# Patient Record
Sex: Female | Born: 1995 | Race: Black or African American | Hispanic: No | Marital: Single | Smoking: Never smoker
Health system: Southern US, Community
[De-identification: ages and names within clinical notes are randomized; demographics above are authoritative.]

## PROBLEM LIST (undated history)

## (undated) ENCOUNTER — Inpatient Hospital Stay (HOSPITAL_COMMUNITY): Payer: Self-pay

## (undated) DIAGNOSIS — F32A Depression, unspecified: Secondary | ICD-10-CM

## (undated) DIAGNOSIS — T7840XA Allergy, unspecified, initial encounter: Secondary | ICD-10-CM

## (undated) DIAGNOSIS — A549 Gonococcal infection, unspecified: Secondary | ICD-10-CM

## (undated) DIAGNOSIS — D649 Anemia, unspecified: Secondary | ICD-10-CM

## (undated) DIAGNOSIS — F329 Major depressive disorder, single episode, unspecified: Secondary | ICD-10-CM

## (undated) DIAGNOSIS — N946 Dysmenorrhea, unspecified: Secondary | ICD-10-CM

## (undated) DIAGNOSIS — F419 Anxiety disorder, unspecified: Secondary | ICD-10-CM

## (undated) DIAGNOSIS — A749 Chlamydial infection, unspecified: Secondary | ICD-10-CM

## (undated) HISTORY — DX: Allergy, unspecified, initial encounter: T78.40XA

## (undated) HISTORY — DX: Anxiety disorder, unspecified: F41.9

## (undated) HISTORY — PX: TONSILLECTOMY AND ADENOIDECTOMY: SHX28

## (undated) HISTORY — PX: WISDOM TOOTH EXTRACTION: SHX21

## (undated) HISTORY — PX: TYMPANOSTOMY TUBE PLACEMENT: SHX32

## (undated) HISTORY — PX: LAPAROSCOPY ABDOMEN DIAGNOSTIC: PRO50

---
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
## 2017-02-22 ENCOUNTER — Encounter (HOSPITAL_COMMUNITY): Payer: Self-pay | Admitting: *Deleted

## 2017-02-22 ENCOUNTER — Emergency Department (HOSPITAL_COMMUNITY)
Admission: EM | Admit: 2017-02-22 | Discharge: 2017-02-22 | Disposition: A | Discharge status: Home / Self Care | Payer: Medicaid Other | Attending: Emergency Medicine | Admitting: Emergency Medicine

## 2017-02-22 DIAGNOSIS — N946 Dysmenorrhea, unspecified: Secondary | ICD-10-CM | POA: Diagnosis present

## 2017-02-22 DIAGNOSIS — Z79899 Other long term (current) drug therapy: Secondary | ICD-10-CM | POA: Insufficient documentation

## 2017-02-22 LAB — COMPREHENSIVE METABOLIC PANEL
ALBUMIN: 3.6 g/dL (ref 3.5–5.0)
ALT: 16 U/L (ref 14–54)
ANION GAP: 6 (ref 5–15)
AST: 22 U/L (ref 15–41)
Alkaline Phosphatase: 45 U/L (ref 38–126)
BUN: 8 mg/dL (ref 6–20)
CO2: 25 mmol/L (ref 22–32)
Calcium: 8.9 mg/dL (ref 8.9–10.3)
Chloride: 105 mmol/L (ref 101–111)
Creatinine, Ser: 0.89 mg/dL (ref 0.44–1.00)
GFR calc Af Amer: >60 mL/min (ref >60)
GFR calc non Af Amer: >60 mL/min (ref >60)
GLUCOSE: 89 mg/dL (ref 65–99)
POTASSIUM: 3.8 mmol/L (ref 3.5–5.1)
SODIUM: 136 mmol/L (ref 135–145)
Total Bilirubin: 0.6 mg/dL (ref 0.3–1.2)
Total Protein: 6.9 g/dL (ref 6.5–8.1)

## 2017-02-22 LAB — URINALYSIS, MICROSCOPIC (REFLEX)

## 2017-02-22 LAB — URINALYSIS, ROUTINE W REFLEX MICROSCOPIC
BILIRUBIN URINE: NEGATIVE
GLUCOSE, UA: NEGATIVE mg/dL
Ketones, ur: NEGATIVE mg/dL
Leukocytes, UA: NEGATIVE
NITRITE: NEGATIVE
PH: 6.5 (ref 5.0–8.0)
Protein, ur: NEGATIVE mg/dL
SPECIFIC GRAVITY, URINE: 1.02 (ref 1.005–1.030)

## 2017-02-22 LAB — CBC
HEMATOCRIT: 38.9 % (ref 36.0–46.0)
HEMOGLOBIN: 12.6 g/dL (ref 12.0–15.0)
MCH: 28.3 pg (ref 26.0–34.0)
MCHC: 32.4 g/dL (ref 30.0–36.0)
MCV: 87.4 fL (ref 78.0–100.0)
Platelets: 469 10*3/uL — ABNORMAL HIGH (ref 150–400)
RBC: 4.45 MIL/uL (ref 3.87–5.11)
RDW: 12.9 % (ref 11.5–15.5)
WBC: 6.6 10*3/uL (ref 4.0–10.5)

## 2017-02-22 LAB — LIPASE, BLOOD: Lipase: 25 U/L (ref 11–51)

## 2017-02-22 MED ORDER — NAPROXEN 250 MG PO TABS
500.0000 mg | ORAL_TABLET | Freq: Once | ORAL | Status: AC
  Administered 2017-02-22: 500 mg via ORAL
  Filled 2017-02-22: qty 2

## 2017-02-22 MED ORDER — NAPROXEN 500 MG PO TABS: 500.0000 mg | ORAL_TABLET | Freq: Two times a day (BID) | ORAL | 0 refills | Status: DC

## 2017-02-22 NOTE — Discharge Instructions (Signed)
Follow these instructions at home: Only take over-the-counter or prescription medicines as directed by your health care provider. Place a heating pad or hot water bottle on your lower back or abdomen. Do not sleep with the heating pad. Use aerobic exercises, walking, swimming, biking, and other exercises to help lessen the cramping. Massage to the lower back or abdomen may help. Stop smoking. Avoid alcohol and caffeine. Contact a health care provider if: Your pain does not get better with medicine. You have pain with sexual intercourse. Your pain increases and is not controlled with medicines. You have abnormal vaginal bleeding with your period. You develop nausea or vomiting with your period that is not controlled with medicine. Get help right away if: You pass out.

## 2017-02-22 NOTE — ED Provider Notes (Signed)
[facility]   CSN: [id number] Arrival date & time: 02/22/17  0505     History   Chief Complaint Chief Complaint  Patient presents with  . Abdominal Pain    HPI Lori Weaver is a 21 y.o. female who presents emergency Department with chief complaint of dysmenorrhea. Patient states that she has been dealing with severe cramps since she began her period. She states that her mother before she passed away, had the same problem. She is currently taking oral contraceptives but states that they don't seem to be helping much anymore. She states that for the past 2 days she has had severe abdominal pain, back pain and cramping. She states that over-the-counter medications are no longer working for her. She took 400 mg of ibuprofen every 4 hours and Tylenol without any relief for her symptoms. She also tried a heating pad. She states that the pain seems to be getting much more intense and her bleeding is heavier. [place] and is now living in [place]. She does not have established a PCP or OB/GYN. She denies urinary symptoms, vaginal discharge or other symptoms of infection. The patient began her period this morning. She denies risk factors for STI. HPI  Past Medical History:  Diagnosis Date  . UTI (urinary tract infection)     There are no active problems to display for this patient.   Past Surgical History:  Procedure Laterality Date  . TONSILLECTOMY      OB History    No data available       Home Medications    Prior to Admission medications   Medication Sig Start Date End Date Taking? Authorizing Provider  FLUoxetine HCl (PROZAC PO) Take by mouth.    [provider]  nitrofurantoin, macrocrystal-monohydrate, (MACROBID) 100 MG capsule Take 1 capsule (100 mg total) by mouth 2 (two) times daily. 11/09/16   Elvina SidleLauenstein, Kurt, MD  Norethin Ace-Eth Estrad-FE (JUNEL FE 24 PO) Take by mouth.    [provider]    Family  History No family history on file.  Social History Social History  Substance Use Topics  . Smoking status: Never Smoker  . Smokeless tobacco: Never Used  . Alcohol use No     Allergies   Patient has no known allergies.   Review of Systems Review of Systems Ten systems reviewed and are negative for acute change, except as noted in the HPI.    Physical Exam Updated Vital Signs BP (!) 125/94   Pulse 94   Temp 98.4 F (36.9 C) (Oral)   Resp 16   Ht 4\' 10"  (1.473 m)   Wt 54.4 kg (120 lb)   LMP 02/22/2017   SpO2 100%   BMI 25.08 kg/m   Physical Exam  Constitutional: She is oriented to person, place, and time. She appears well-developed and well-nourished. No distress.  HENT:  Head: Normocephalic and atraumatic.  Eyes: Conjunctivae are normal. No scleral icterus.  Neck: Normal range of motion.  Cardiovascular: Normal rate, regular rhythm and normal heart sounds.  Exam reveals no gallop and no friction rub.   No murmur heard. Pulmonary/Chest: Effort normal and breath sounds normal. No respiratory distress.  Abdominal: Soft. Bowel sounds are normal. She exhibits no distension and no mass. There is no tenderness. There is no guarding.  Neurological: She is alert and oriented to person, place, and time.  Skin: Skin is warm and dry. She is not diaphoretic.  Psychiatric: Her behavior is  normal.  Nursing note and vitals reviewed.    ED Treatments / Results  Labs (all labs ordered are listed, but only abnormal results are displayed) Labs Reviewed  CBC - Abnormal; Notable for the following:       Result Value   Platelets 469 (*)    All other components within normal limits  URINALYSIS, ROUTINE W REFLEX MICROSCOPIC - Abnormal; Notable for the following:    APPearance CLOUDY (*)    Hgb urine dipstick LARGE (*)    All other components within normal limits  URINALYSIS, MICROSCOPIC (REFLEX) - Abnormal; Notable for the following:    Bacteria, UA RARE (*)    Squamous  Epithelial / LPF 6-30 (*)    All other components within normal limits  LIPASE, BLOOD  COMPREHENSIVE METABOLIC PANEL    EKG  EKG Interpretation None       Radiology No results found.  Procedures Procedures (including critical care time)  Medications Ordered in ED Medications - No data to display   Initial Impression / Assessment and Plan / ED Course  I have reviewed the triage vital signs and the nursing notes.  Pertinent labs & imaging results that were available during my care of the patient were reviewed by me and considered in my medical decision making (see chart for details).      patient with worsening and chronic dysmenorrhea. Her lab results are reviewed and appear within normal limits. Her urine does have blood, which is secondary to the onset of her period this morning. We will try high-dose naproxen. She may supplement with Tylenol. Discussed supportive care. Patient is to follow closely with OB/GYN. I discussed return precautions.The patient appears reasonably screened and/or stabilized for discharge and I doubt any other medical condition or other [facility] requiring further screening, evaluation, or treatment in the ED at this time prior to discharge.   Final Clinical Impressions(s) / ED Diagnoses   Final diagnoses:  Severe dysmenorrhea    New Prescriptions New Prescriptions   No medications on file     Arthor CaptainHarris, [place], PA-C 02/22/17 [id number]    Gilda CreasePollina, Christopher J, MD 02/22/17 [phone]

## 2017-02-22 NOTE — ED Triage Notes (Signed)
The pt is c/o abd pain for 2 days her period started  now

## 2017-02-22 NOTE — ED Notes (Signed)
Friends brought food and drink, informed pt that she should wait until she sees the provider

## 2017-03-02 DIAGNOSIS — F321 Major depressive disorder, single episode, moderate: Secondary | ICD-10-CM | POA: Diagnosis not present

## 2017-03-02 DIAGNOSIS — F411 Generalized anxiety disorder: Secondary | ICD-10-CM | POA: Diagnosis not present

## 2017-04-06 ENCOUNTER — Encounter: Payer: Self-pay | Admitting: Obstetrics & Gynecology

## 2017-04-06 ENCOUNTER — Ambulatory Visit (INDEPENDENT_AMBULATORY_CARE_PROVIDER_SITE_OTHER): Payer: Medicaid Other | Admitting: Obstetrics & Gynecology

## 2017-04-06 VITALS — BP 128/71 | HR 80 | Temp 98.6°F | Ht 59.0 in | Wt 124.0 lb

## 2017-04-06 DIAGNOSIS — N946 Dysmenorrhea, unspecified: Secondary | ICD-10-CM | POA: Diagnosis not present

## 2017-04-06 DIAGNOSIS — K649 Unspecified hemorrhoids: Secondary | ICD-10-CM | POA: Diagnosis not present

## 2017-04-06 MED ORDER — HYDROCORTISONE ACETATE 25 MG RE SUPP: 25.0000 mg | Freq: Two times a day (BID) | RECTAL | 2 refills | Status: DC

## 2017-04-06 MED ORDER — NORETHIN ACE-ETH ESTRAD-FE 1-20 MG-MCG PO TABS: 1.0000 | ORAL_TABLET | Freq: Every day | ORAL | 8 refills | Status: DC

## 2017-04-06 MED ORDER — NAPROXEN 500 MG PO TABS: 500.0000 mg | ORAL_TABLET | Freq: Two times a day (BID) | ORAL | 8 refills | Status: DC | PRN

## 2017-04-06 NOTE — Patient Instructions (Signed)
Dysmenorrhea Menstrual cramps (dysmenorrhea) are caused by the muscles of the uterus tightening (contracting) during a menstrual period. For some women, this discomfort is merely bothersome. For others, dysmenorrhea can be severe enough to interfere with everyday activities for a few days each month. Primary dysmenorrhea is menstrual cramps that last a couple of days when you start having menstrual periods or soon after. This often begins after a teenager starts having her period. As a woman gets older or has a baby, the cramps will usually lessen or disappear. Secondary dysmenorrhea begins later in life, lasts longer, and the pain may be stronger than primary dysmenorrhea. The pain may start before the period and last a few days after the period. What are the causes? Dysmenorrhea is usually caused by an underlying problem, such as:  The tissue lining the uterus grows outside of the uterus in other areas of the body (endometriosis).  The endometrial tissue, which normally lines the uterus, is found in or grows into the muscular walls of the uterus (adenomyosis).  The pelvic blood vessels are engorged with blood just before the menstrual period (pelvic congestive syndrome).  Overgrowth of cells (polyps) in the lining of the uterus or cervix.  Falling down of the uterus (prolapse) because of loose or stretched ligaments.  Depression.  Bladder problems, infection, or inflammation.  Problems with the intestine, a tumor, or irritable bowel syndrome.  Cancer of the female organs or bladder.  A severely tipped uterus.  A very tight opening or closed cervix.  Noncancerous tumors of the uterus (fibroids).  Pelvic inflammatory disease (PID).  Pelvic scarring (adhesions) from a previous surgery.  Ovarian cyst.  An intrauterine device (IUD) used for birth control.  What increases the risk? You may be at greater risk of dysmenorrhea if:  You are younger than age 44.  You started puberty  early.  You have irregular or heavy bleeding.  You have never given birth.  You have a family history of this problem.  You are a smoker.  What are the signs or symptoms?  Cramping or throbbing pain in your lower abdomen.  Headaches.  Lower back pain.  Nausea or vomiting.  Diarrhea.  Sweating or dizziness.  Loose stools. How is this diagnosed? A diagnosis is based on your history, symptoms, physical exam, diagnostic tests, or procedures. Diagnostic tests or procedures may include:  Blood tests.  Ultrasonography.  An examination of the lining of the uterus (dilation and curettage, D&C).  An examination inside your abdomen or pelvis with a scope (laparoscopy).  X-rays.  CT scan.  MRI.  An examination inside the bladder with a scope (cystoscopy).  An examination inside the intestine or stomach with a scope (colonoscopy, gastroscopy).  How is this treated? Treatment depends on the cause of the dysmenorrhea. Treatment may include:  Pain medicine prescribed by your health care provider.  Birth control pills or an IUD with progesterone hormone in it.  Hormone replacement therapy.  Nonsteroidal anti-inflammatory drugs (NSAIDs). These may help stop the production of prostaglandins.  Surgery to remove adhesions, endometriosis, ovarian cyst, or fibroids.  Removal of the uterus (hysterectomy).  Progesterone shots to stop the menstrual period.  Cutting the nerves on the sacrum that go to the female organs (presacral neurectomy).  Electric current to the sacral nerves (sacral nerve stimulation).  Antidepressant medicine.  Psychiatric therapy, counseling, or group therapy.  Exercise and physical therapy.  Meditation and yoga therapy.  Acupuncture.  Follow these instructions at home:  Only take over-the-counter or  prescription medicines as directed by your health care provider.  Place a heating pad or hot water bottle on your lower back or abdomen. Do  not sleep with the heating pad.  Use aerobic exercises, walking, swimming, biking, and other exercises to help lessen the cramping.  Massage to the lower back or abdomen may help.  Stop smoking.  Avoid alcohol and caffeine. Contact a health care provider if:  Your pain does not get better with medicine.  You have pain with sexual intercourse.  Your pain increases and is not controlled with medicines.  You have abnormal vaginal bleeding with your period.  You develop nausea or vomiting with your period that is not controlled with medicine. Get help right away if: You pass out. This information is not intended to replace advice given to you by your health care provider. Make sure you discuss any questions you have with your health care provider. Document Released: 07/07/2005 Document Revised: 12/13/2015 Document Reviewed: 12/23/2012 Elsevier Interactive Patient Education  2017 [employer] Inc.    Diagnostic Laparoscopy A diagnostic laparoscopy is a procedure to diagnose diseases in the abdomen. During the procedure, a thin, lighted, pencil-sized instrument called a laparoscope is inserted into the abdomen through an incision. The laparoscope allows your health care provider to look at the organs inside your body. Tell a health care provider about:  Any allergies you have.  All medicines you are taking, including vitamins, herbs, eye drops, creams, and over-the-counter medicines.  Any problems you or family members have had with anesthetic medicines.  Any blood disorders you have.  Any surgeries you have had.  Any medical conditions you have. What are the risks? Generally, this is a safe procedure. However, problems can occur, which may include:  Infection.  Bleeding.  Damage to other organs.  Allergic reaction to the anesthetics used during the procedure.  What happens before the procedure?  Do not eat or drink anything after midnight on the night before the  procedure or as directed by your health care provider.  Ask your health care provider about: ? Changing or stopping your regular medicines. ? Taking medicines such as aspirin and ibuprofen. These medicines can thin your blood. Do not take these medicines before your procedure if your health care provider instructs you not to.  Plan to have someone take you home after the procedure. What happens during the procedure?  You may be given a medicine to help you relax (sedative).  You will be given a medicine to make you sleep (general anesthetic).  Your abdomen will be inflated with a gas. This will make your organs easier to see.  Small incisions will be made in your abdomen.  A laparoscope and other small instruments will be inserted into the abdomen through the incisions.  A tissue sample may be removed from an organ in the abdomen for examination.  The instruments will be removed from the abdomen.  The gas will be released.  The incisions will be closed with stitches (sutures). What happens after the procedure? Your blood pressure, heart rate, breathing rate, and blood oxygen level will be monitored often until the medicines you were given have worn off. This information is not intended to replace advice given to you by your health care provider. Make sure you discuss any questions you have with your health care provider. Document Released: 10/13/2000 Document Revised: 11/15/2015 Document Reviewed: 02/17/2014 Elsevier Interactive Patient Education  2018 [employer].   About Hemorrhoids  Hemorrhoids are swollen veins in  the lower rectum and anus.  Also called piles, hemorrhoids are a common problem.  Hemorrhoids may be internal (inside the rectum) or external (around the anus).  Internal Hemorrhoids  Internal hemorrhoids are often painless, but they rarely cause bleeding.  The internal veins may stretch and fall down (prolapse) through the anus to the outside of the body.  The  veins may then become irritated and painful.  External Hemorrhoids  External hemorrhoids can be easily seen or felt around the anal opening.  They are under the skin around the anus.  When the swollen veins are scratched or broken by straining, rubbing or wiping they sometimes bleed.  How Hemorrhoids Occur  Veins in the rectum and around the anus tend to swell under pressure.  Hemorrhoids can result from increased pressure in the veins of your anus or rectum.  Some sources of pressure are:   Straining to have a bowel movement because of constipation  Waiting too long to have a bowel movement  Coughing and sneezing often  Sitting for extended periods of time, including on the toilet  Diarrhea  Obesity  Trauma or injury to the anus  Some liver diseases  Stress  Family history of hemorrhoids  Pregnancy  Pregnant women should try to avoid becoming constipated, because they are more likely to have hemorrhoids during pregnancy.  In the last trimester of pregnancy, the enlarged uterus may press on blood vessels and causes hemorrhoids.  In addition, the strain of childbirth sometimes causes hemorrhoids after the birth.  Symptoms of Hemorrhoids  Some symptoms of hemorrhoids include:  Swelling and/or a tender lump around the anus  Itching, mild burning and bleeding around the anus  Painful bowel movements with or without constipation  Bright red blood covering the stool, on toilet paper or in the toilet bowel.   Symptoms usually go away within a few days.  Always talk to your doctor about any bleeding to make sure it is not from some other causes.  Diagnosing and Treating Hemorrhoids  Diagnosis is made by an examination by your healthcare provider.  Special test can be performed by your doctor.    Most cases of hemorrhoids can be treated with:  High-fiber diet: Eat more high-fiber foods, which help prevent constipation.  Ask for more detailed fiber information on types and  sources of fiber from your healthcare provider.  Fluids: Drink plenty of water.  This helps soften bowel movements so they are easier to pass.  Sitz baths and cold packs: Sitting in lukewarm water two or three times a day for 15 minutes cleases the anal area and may relieve discomfort.  If the water is too hot, swelling around the anus will get worse.  Placing a cloth-covered ice pack on the anus for ten minutes four times a day can also help reduce selling.  Gently pushing a prolapsed hemorrhoid back inside after the bath or ice pack can be helpful.  Medications: For mild discomfort, your healthcare provider may suggest over-the-counter pain medication or prescribe a cream or ointment for topical use.  The cream may contain witch hazel, zinc oxide or petroleum jelly.  Medicated suppositories are also a treatment option.  Always consult your doctor before applying medications or creams.  Procedures and surgeries: There are also a number of procedures and surgeries to shrink or remove hemorrhoids in more serious cases.  Talk to your physician about these options.  You can often prevent hemorrhoids or keep them from becoming worse by maintaining a  healthy lifestyle.  Eat a fiber-rich diet of fruits, vegetables and whole grains.  Also, drink plenty of water and exercise regularly.   2007, [employer] Doc.30

## 2017-04-06 NOTE — Progress Notes (Signed)
GYNECOLOGY OFFICE VISIT NOTE  History:  21 y.o. G0 here for discussion about evaluation of dysmenorrhea. She reports having painful menstrual periods since menarche, progressively getting worse.  Was started on Junel 1/20 and this helped a bit but now has gotten worse.  Has back pain thorough out cycle, but increased lower abdominal and pelvic cramping during periods. Periods last for 5 days during placebo week.  Takes Naproxen which sometimes helps. Patient is worried about and wants to be evaluated for endometriosis.  She denies any abnormal vaginal discharge or other GYN concerns but reports periodic blood in stool which she attributes to possible hemorrhoids. She is a Consulting civil engineer at [employer].   Past Medical History:  Diagnosis Date  . UTI (urinary tract infection)     Past Surgical History:  Procedure Laterality Date  . TONSILLECTOMY      The following portions of the patient's history were reviewed and updated as appropriate: allergies, current medications, past family history, past medical history, past social history, past surgical history and problem list.   Review of Systems:  Pertinent items noted in HPI and remainder of comprehensive ROS otherwise negative.   Objective:  Physical Exam BP 128/71   Pulse 80   Temp 98.6 F (37 C) (Oral)   Ht  (1.499 m)   Wt 124 lb (56.2 kg)   LMP 03/27/2017 (Exact Date)   BMI 25.04 kg/m  CONSTITUTIONAL: Well-developed, well-nourished female in no acute distress.  HENT:  Normocephalic, atraumatic. External right and left ear normal. Oropharynx is clear and moist EYES: Conjunctivae and EOM are normal. Pupils are equal, round, and reactive to light. No scleral icterus.  NECK: Normal range of motion, supple, no masses SKIN: Skin is warm and dry. No rash noted. Not diaphoretic. No erythema. No pallor. NEUROLOGIC: Alert and oriented to person, place, and time. Normal reflexes, muscle tone coordination. No cranial nerve deficit  noted. PSYCHIATRIC: Normal mood and affect. Normal behavior. Normal judgment and thought content. CARDIOVASCULAR: Normal heart rate noted RESPIRATORY: Effort and breath sounds normal, no problems with respiration noted ABDOMEN: Soft, no distention noted.   PELVIC: Deferred MUSCULOSKELETAL: Normal range of motion. No edema noted.  Labs and Imaging No results found.  Assessment & Plan:  1. Dysmenorrhea Discussed evaluation and management of dysmenorrhea in detail.  Discussed ultrasound evaluation and recommended diagnostic laparoscopy. Laparoscopy discussed in detail, risks/benefits/indications/alternatives also discussed.   She was told that she will be contacted by our surgical scheduler regarding the time and date of her surgery. Printed patient education handouts about the procedure were given to the patient to review at home. In the meantime, discussed management with continuous hormonal therapy with the Junel. If this is not as effective, will suggest increasing to a 30-35 mcg estradiol pill. Pelvic ultrasound also ordered to rule out any other structural anomaly. - norethindrone-ethinyl estradiol (JUNEL FE 1/20) 1-20 MG-MCG tablet; Take 1 tablet by mouth daily. Only take active/hormonal pills in a continuous fashion  Dispense: 3 Package; Refill: 8 - naproxen (NAPROSYN) 500 MG tablet; Take 1 tablet (500 mg total) by mouth 2 (two) times daily as needed. As needed for cramps. Take with meals.  Dispense: 30 tablet; Refill: 8 - US PELVIS (TRANSABDOMINAL ONLY); Future - US PELVIS TRANSVANGINAL NON-OB (TV ONLY); Future  2. Hemorrhoids, unspecified hemorrhoid type Will do trial of Anusol therapy. Told to follow up with PCP if condition persists or worsens.  - hydrocortisone (ANUSOL-HC) 25 MG suppository; Place 1 suppository (25 mg total) rectally  2 (two) times daily.  Dispense: 12 suppository; Refill: 2   Routine preventative health maintenance measures emphasized. Please refer to After Visit  Summary for other counseling recommendations.   Return if symptoms worsen or fail to improve.   Total face-to-face time with patient: 30 minutes. Over 50% of encounter was spent on counseling and coordination of care.   Jaynie Collins, MD, FACOG Attending Obstetrician & Gynecologist, [facility] for [employer], [facility]

## 2017-04-06 NOTE — Progress Notes (Signed)
GYN [place] scheduled for September 20th@ 1545.  Pt notified.

## 2017-04-07 ENCOUNTER — Encounter (HOSPITAL_COMMUNITY): Payer: Self-pay

## 2017-04-09 ENCOUNTER — Ambulatory Visit (HOSPITAL_COMMUNITY)
Admission: RE | Admit: 2017-04-09 | Discharge: 2017-04-09 | Disposition: A | Discharge status: Home / Self Care | Payer: Medicaid Other | Source: Ambulatory Visit | Attending: Obstetrics & Gynecology | Admitting: Obstetrics & Gynecology

## 2017-04-09 DIAGNOSIS — N946 Dysmenorrhea, unspecified: Secondary | ICD-10-CM | POA: Diagnosis present

## 2017-04-20 DIAGNOSIS — N946 Dysmenorrhea, unspecified: Secondary | ICD-10-CM

## 2017-04-20 HISTORY — DX: Dysmenorrhea, unspecified: N94.6

## 2017-04-30 ENCOUNTER — Encounter (HOSPITAL_BASED_OUTPATIENT_CLINIC_OR_DEPARTMENT_OTHER): Payer: Self-pay | Admitting: *Deleted

## 2017-04-30 NOTE — Pre-Procedure Instructions (Signed)
To come for CBC and urine preg. 

## 2017-05-04 ENCOUNTER — Encounter (HOSPITAL_BASED_OUTPATIENT_CLINIC_OR_DEPARTMENT_OTHER)
Admission: RE | Admit: 2017-05-04 | Discharge: 2017-05-04 | Disposition: A | Discharge status: Home / Self Care | Payer: Medicaid Other | Source: Ambulatory Visit | Attending: Obstetrics & Gynecology | Admitting: Obstetrics & Gynecology

## 2017-05-04 DIAGNOSIS — N946 Dysmenorrhea, unspecified: Secondary | ICD-10-CM | POA: Diagnosis not present

## 2017-05-04 DIAGNOSIS — R102 Pelvic and perineal pain: Secondary | ICD-10-CM | POA: Diagnosis not present

## 2017-05-04 DIAGNOSIS — G8929 Other chronic pain: Secondary | ICD-10-CM | POA: Diagnosis not present

## 2017-05-04 DIAGNOSIS — Z01812 Encounter for preprocedural laboratory examination: Secondary | ICD-10-CM | POA: Insufficient documentation

## 2017-05-04 DIAGNOSIS — K649 Unspecified hemorrhoids: Secondary | ICD-10-CM | POA: Insufficient documentation

## 2017-05-04 LAB — CBC
HCT: 37.4 % (ref 36.0–46.0)
HEMOGLOBIN: 12.4 g/dL (ref 12.0–15.0)
MCH: 29 pg (ref 26.0–34.0)
MCHC: 33.2 g/dL (ref 30.0–36.0)
MCV: 87.4 fL (ref 78.0–100.0)
PLATELETS: 385 10*3/uL (ref 150–400)
RBC: 4.28 MIL/uL (ref 3.87–5.11)
RDW: 12.6 % (ref 11.5–15.5)
WBC: 6.3 10*3/uL (ref 4.0–10.5)

## 2017-05-05 LAB — POCT PREGNANCY, URINE: PREG TEST UR: NEGATIVE

## 2017-05-06 ENCOUNTER — Encounter (HOSPITAL_BASED_OUTPATIENT_CLINIC_OR_DEPARTMENT_OTHER)
Admission: RE | Disposition: A | Discharge status: Home / Self Care | Payer: Self-pay | Source: Ambulatory Visit | Attending: Obstetrics & Gynecology

## 2017-05-06 ENCOUNTER — Ambulatory Visit (HOSPITAL_BASED_OUTPATIENT_CLINIC_OR_DEPARTMENT_OTHER): Payer: Medicaid Other | Admitting: Anesthesiology

## 2017-05-06 ENCOUNTER — Ambulatory Visit (HOSPITAL_BASED_OUTPATIENT_CLINIC_OR_DEPARTMENT_OTHER)
Admission: RE | Admit: 2017-05-06 | Discharge: 2017-05-06 | Disposition: A | Discharge status: Home / Self Care | Payer: Medicaid Other | Source: Ambulatory Visit | Attending: Obstetrics & Gynecology | Admitting: Obstetrics & Gynecology

## 2017-05-06 ENCOUNTER — Encounter (HOSPITAL_BASED_OUTPATIENT_CLINIC_OR_DEPARTMENT_OTHER): Payer: Self-pay | Admitting: *Deleted

## 2017-05-06 DIAGNOSIS — R102 Pelvic and perineal pain: Secondary | ICD-10-CM | POA: Diagnosis not present

## 2017-05-06 DIAGNOSIS — K649 Unspecified hemorrhoids: Secondary | ICD-10-CM | POA: Diagnosis not present

## 2017-05-06 DIAGNOSIS — G8929 Other chronic pain: Secondary | ICD-10-CM | POA: Diagnosis not present

## 2017-05-06 DIAGNOSIS — N946 Dysmenorrhea, unspecified: Secondary | ICD-10-CM | POA: Diagnosis not present

## 2017-05-06 HISTORY — DX: Major depressive disorder, single episode, unspecified: F32.9

## 2017-05-06 HISTORY — DX: Dysmenorrhea, unspecified: N94.6

## 2017-05-06 HISTORY — DX: Depression, unspecified: F32.A

## 2017-05-06 HISTORY — PX: LAPAROSCOPY: SHX197

## 2017-05-06 SURGERY — LAPAROSCOPY, DIAGNOSTIC
Anesthesia: General | Site: Abdomen

## 2017-05-06 MED ORDER — BUPIVACAINE HCL (PF) 0.5 % IJ SOLN
INTRAMUSCULAR | Status: AC
  Filled 2017-05-06: qty 30

## 2017-05-06 MED ORDER — OXYCODONE-ACETAMINOPHEN 5-325 MG PO TABS: 1.0000 | ORAL_TABLET | Freq: Four times a day (QID) | ORAL | 0 refills | Status: DC | PRN

## 2017-05-06 MED ORDER — LACTATED RINGERS IV SOLN
INTRAVENOUS | Status: DC
  Administered 2017-05-06: 10 mL/h via INTRAVENOUS

## 2017-05-06 MED ORDER — SUGAMMADEX SODIUM 500 MG/5ML IV SOLN
INTRAVENOUS | Status: DC | PRN
  Administered 2017-05-06: 500 mg via INTRAVENOUS

## 2017-05-06 MED ORDER — FENTANYL CITRATE (PF) 100 MCG/2ML IJ SOLN
INTRAMUSCULAR | Status: AC
  Filled 2017-05-06: qty 2

## 2017-05-06 MED ORDER — MIDAZOLAM HCL 2 MG/2ML IJ SOLN
INTRAMUSCULAR | Status: AC
Start: 2017-05-06 — End: 2017-05-06
  Filled 2017-05-06: qty 2

## 2017-05-06 MED ORDER — DOCUSATE SODIUM 100 MG PO CAPS: 100.0000 mg | ORAL_CAPSULE | Freq: Two times a day (BID) | ORAL | 2 refills | Status: DC | PRN

## 2017-05-06 MED ORDER — NAPROXEN 500 MG PO TABS: 500.0000 mg | ORAL_TABLET | Freq: Two times a day (BID) | ORAL | 8 refills | Status: DC

## 2017-05-06 MED ORDER — LIDOCAINE 2% (20 MG/ML) 5 ML SYRINGE
INTRAMUSCULAR | Status: AC
  Filled 2017-05-06: qty 5

## 2017-05-06 MED ORDER — ROCURONIUM BROMIDE 100 MG/10ML IV SOLN
INTRAVENOUS | Status: DC | PRN
  Administered 2017-05-06: 40 mg via INTRAVENOUS

## 2017-05-06 MED ORDER — KETOROLAC TROMETHAMINE 30 MG/ML IJ SOLN
INTRAMUSCULAR | Status: DC | PRN
  Administered 2017-05-06: 30 mg via INTRAVENOUS

## 2017-05-06 MED ORDER — BUPIVACAINE HCL (PF) 0.5 % IJ SOLN
INTRAMUSCULAR | Status: DC | PRN
  Administered 2017-05-06: 8 mL

## 2017-05-06 MED ORDER — SUGAMMADEX SODIUM 500 MG/5ML IV SOLN
INTRAVENOUS | Status: AC
  Filled 2017-05-06: qty 5

## 2017-05-06 MED ORDER — MIDAZOLAM HCL 2 MG/2ML IJ SOLN
1.0000 mg | INTRAMUSCULAR | Status: DC | PRN
  Administered 2017-05-06: 2 mg via INTRAVENOUS

## 2017-05-06 MED ORDER — DEXAMETHASONE SODIUM PHOSPHATE 4 MG/ML IJ SOLN
INTRAMUSCULAR | Status: DC | PRN
Start: 2017-05-06 — End: 2017-05-06
  Administered 2017-05-06: 10 mg via INTRAVENOUS

## 2017-05-06 MED ORDER — SCOPOLAMINE 1 MG/3DAYS TD PT72: 1.0000 | MEDICATED_PATCH | Freq: Once | TRANSDERMAL | Status: DC | PRN

## 2017-05-06 MED ORDER — FENTANYL CITRATE (PF) 100 MCG/2ML IJ SOLN
25.0000 ug | INTRAMUSCULAR | Status: DC | PRN
  Administered 2017-05-06 (×2): 50 ug via INTRAVENOUS

## 2017-05-06 MED ORDER — ONDANSETRON HCL 4 MG/2ML IJ SOLN
INTRAMUSCULAR | Status: AC
  Filled 2017-05-06: qty 2

## 2017-05-06 MED ORDER — LACTATED RINGERS IV SOLN: INTRAVENOUS | Status: DC

## 2017-05-06 MED ORDER — ONDANSETRON HCL 4 MG/2ML IJ SOLN
INTRAMUSCULAR | Status: DC | PRN
  Administered 2017-05-06: 4 mg via INTRAVENOUS

## 2017-05-06 MED ORDER — LIDOCAINE HCL (CARDIAC) 20 MG/ML IV SOLN
INTRAVENOUS | Status: DC | PRN
  Administered 2017-05-06: 40 mg via INTRAVENOUS

## 2017-05-06 MED ORDER — PROMETHAZINE HCL 25 MG/ML IJ SOLN: 6.2500 mg | INTRAMUSCULAR | Status: DC | PRN

## 2017-05-06 MED ORDER — FENTANYL CITRATE (PF) 100 MCG/2ML IJ SOLN
50.0000 ug | INTRAMUSCULAR | Status: DC | PRN
  Administered 2017-05-06: 100 ug via INTRAVENOUS

## 2017-05-06 MED ORDER — PROPOFOL 10 MG/ML IV BOLUS
INTRAVENOUS | Status: DC | PRN
  Administered 2017-05-06: 150 mg via INTRAVENOUS

## 2017-05-06 MED ORDER — KETOROLAC TROMETHAMINE 30 MG/ML IJ SOLN
INTRAMUSCULAR | Status: AC
  Filled 2017-05-06: qty 1

## 2017-05-06 MED ORDER — DEXAMETHASONE SODIUM PHOSPHATE 10 MG/ML IJ SOLN
INTRAMUSCULAR | Status: AC
  Filled 2017-05-06: qty 1

## 2017-05-06 SURGICAL SUPPLY — 30 items
CABLE HIGH FREQUENCY MONO STRZ (ELECTRODE) IMPLANT
CLOTH BEACON ORANGE TIMEOUT ST (SAFETY) ×3 IMPLANT
COVER MAYO STAND STRL (DRAPES) ×3 IMPLANT
DERMABOND ADVANCED (GAUZE/BANDAGES/DRESSINGS) ×2
DERMABOND ADVANCED .7 DNX12 (GAUZE/BANDAGES/DRESSINGS) ×1 IMPLANT
DRSG OPSITE POSTOP 3X4 (GAUZE/BANDAGES/DRESSINGS) IMPLANT
DURAPREP 26ML APPLICATOR (WOUND CARE) ×3 IMPLANT
GLOVE BIOGEL PI IND STRL 7.0 (GLOVE) ×4 IMPLANT
GLOVE BIOGEL PI INDICATOR 7.0 (GLOVE) ×8
GLOVE ECLIPSE 7.0 STRL STRAW (GLOVE) ×3 IMPLANT
GOWN STRL REUS W/TWL LRG LVL3 (GOWN DISPOSABLE) ×6 IMPLANT
NEEDLE INSUFFLATION 120MM (ENDOMECHANICALS) IMPLANT
NS IRRIG 1000ML POUR BTL (IV SOLUTION) ×3 IMPLANT
PACK LAPAROSCOPY BASIN (CUSTOM PROCEDURE TRAY) ×3 IMPLANT
PACK TRENDGUARD 450 HYBRID PRO (MISCELLANEOUS) ×1 IMPLANT
PACK TRENDGUARD 600 HYBRD PROC (MISCELLANEOUS) IMPLANT
PAD ARMBOARD 7.5X6 YLW CONV (MISCELLANEOUS) ×6 IMPLANT
POUCH SPECIMEN RETRIEVAL 10MM (ENDOMECHANICALS) IMPLANT
SET IRRIG TUBING LAPAROSCOPIC (IRRIGATION / IRRIGATOR) IMPLANT
SHEARS HARMONIC ACE PLUS 36CM (ENDOMECHANICALS) IMPLANT
SLEEVE XCEL OPT CAN 5 100 (ENDOMECHANICALS) IMPLANT
SUT VICRYL 0 UR6 27IN ABS (SUTURE) IMPLANT
SUT VICRYL 4-0 PS2 18IN ABS (SUTURE) IMPLANT
TOWEL OR 17X24 6PK STRL BLUE (TOWEL DISPOSABLE) ×6 IMPLANT
TRAY FOLEY BAG SILVER LF 14FR (SET/KITS/TRAYS/PACK) ×3 IMPLANT
TRENDGUARD 450 HYBRID PRO PACK (MISCELLANEOUS) ×3
TRENDGUARD 600 HYBRID PROC PK (MISCELLANEOUS)
TROCAR XCEL NON-BLD 11X100MML (ENDOMECHANICALS) IMPLANT
TROCAR XCEL NON-BLD 5MMX100MML (ENDOMECHANICALS) ×3 IMPLANT
WARMER LAPAROSCOPE (MISCELLANEOUS) ×3 IMPLANT

## 2017-05-06 NOTE — Anesthesia Preprocedure Evaluation (Signed)
Anesthesia Evaluation  Patient identified by MRN, date of birth, ID band Patient awake    Reviewed: Allergy & Precautions, NPO status , Patient's Chart, lab work & pertinent test results  Airway Mallampati: II  TM Distance: >3 FB Neck ROM: Full    Dental  (+) Teeth Intact, Dental Advisory Given   Pulmonary neg pulmonary ROS,    Pulmonary exam normal breath sounds clear to auscultation       Cardiovascular Exercise Tolerance: Good negative cardio ROS Normal cardiovascular exam Rhythm:Regular Rate:Normal     Neuro/Psych PSYCHIATRIC DISORDERS Depression negative neurological ROS     GI/Hepatic negative GI ROS, Neg liver ROS,   Endo/Other  negative endocrine ROS  Renal/GU negative Renal ROS     Musculoskeletal negative musculoskeletal ROS (+)   Abdominal   Peds  Hematology negative hematology ROS (+)   Anesthesia Other Findings Day of surgery medications reviewed with the patient.  Reproductive/Obstetrics Dysmenorrhea                             Anesthesia Physical Anesthesia Plan  ASA: II  Anesthesia Plan: General   Post-op Pain Management:    Induction: Intravenous  PONV Risk Score and Plan: 3 and Ondansetron, Dexamethasone and Midazolam  Airway Management Planned: Oral ETT  Additional Equipment:   Intra-op Plan:   Post-operative Plan: Extubation in OR  Informed Consent: I have reviewed the patients History and Physical, chart, labs and discussed the procedure including the risks, benefits and alternatives for the proposed anesthesia with the patient or authorized representative who has indicated his/her understanding and acceptance.   Dental advisory given  Plan Discussed with: CRNA  Anesthesia Plan Comments: (Risks/benefits of general anesthesia discussed with patient including risk of damage to teeth, lips, gum, and tongue, nausea/vomiting, allergic reactions to  medications, and the possibility of heart attack, stroke and death.  All patient questions answered.  Patient wishes to proceed.)        Anesthesia Quick Evaluation

## 2017-05-06 NOTE — Discharge Instructions (Signed)
°Post Anesthesia Home Care Instructions ° °Activity: °Get plenty of rest for the remainder of the day. A responsible individual must stay with you for 24 hours following the procedure.  °For the next 24 hours, DO NOT: °-Drive a car °-Operate machinery °-Drink alcoholic beverages °-Take any medication unless instructed by your physician °-Make any legal decisions or sign important papers. ° °Meals: °Start with liquid foods such as gelatin or soup. Progress to regular foods as tolerated. Avoid greasy, spicy, heavy foods. If nausea and/or vomiting occur, drink only clear liquids until the nausea and/or vomiting subsides. Call your physician if vomiting continues. ° °Special Instructions/Symptoms: °Your throat may feel dry or sore from the anesthesia or the breathing tube placed in your throat during surgery. If this causes discomfort, gargle with warm salt water. The discomfort should disappear within 24 hours. ° °If you had a scopolamine patch placed behind your ear for the management of post- operative nausea and/or vomiting: ° °1. The medication in the patch is effective for 72 hours, after which it should be removed.  Wrap patch in a tissue and discard in the trash. Wash hands thoroughly with soap and water. °2. You may remove the patch earlier than 72 hours if you experience unpleasant side effects which may include dry mouth, dizziness or visual disturbances. °3. Avoid touching the patch. Wash your hands with soap and water after contact with the patch. °  °Laparoscopic Surgery - Care After °Laparoscopy is a surgical procedure. It is used to diagnose and treat diseases inside the belly(abdomen). It is usually a brief, common, and relatively simple procedure. The laparoscopeis a thin, lighted, pencil-sized instrument. It is like a telescope. It is inserted into your abdomen through a small cut (incision). Your caregiver can look at the organs inside your body through this instrument.  She can see if there is  anything abnormal. °Laparoscopy can be done either in a hospital or outpatient clinic. You may be given a mild sedative to help you relax before the procedure. Once in the operating room, you will be given a drug to make you sleep (general anesthesia). Laparoscopy usually lasts about 1 hour. After the procedure, you will be monitored in a recovery area until you are stable and doing well. Once you are home, it may take 3 to 7 days to fully recover. °·  °Laparoscopy has relatively few risks. Your caregiver will discuss the risks with you before the procedure. Some problems that can occur include: °RISKS AND COMPLICATIONS  °· Allergies to medicines. °· Difficulty breathing. °· Bleeding. °· Infection. °· Damage to other surrounding structures °HOME CARE INSTRUCTIONS  °· Infection. °· Bleeding. °· Damage to other organs. °· Anesthetic side effects.  °· Need for additional procedures such as open procedures/laparotomy °PROCEDURE °Once you receive anesthesia, your surgeon inflates the abdomen with a harmless gas (carbon dioxide). This makes the organs easier to see. The laparoscope is inserted into the abdomen through a small incision. This allows your surgeon to see into the abdomen. Other small instruments are also inserted into the abdomen through other small openings. Many surgeons attach a video camera to the laparoscope to enlarge the view. °During a laparoscopy, the surgeon may be looking for inflammation, infection, or cancer.  The surgeon may also need to take out certain organs or take tissue samples (biopsies). The specimens are sent to a specialist in looking at cells and tissue samples (pathologist). The pathologist examines them under a microscope to help to diagnose or confirm a   disease. °AFTER THE PROCEDURE  °· The incisions are closed with stitches (sutures) and Dermabond. Because these incisions are small (usually less than 1/2 inch), there is usually minimal discomfort after the procedure. There may  also be discomfort from the instrument placement incisions in the abdomen. You will be given pain medicine to ease any discomfort. °· You will rest in a recovery room for 1-2 hours until you are stable and doing well. °· You may have some mild discomfort in the throat. This is from the tube placed in your throat while you were sleeping. °· You may experience discomfort in the shoulder area from some trapped air between the liver and diaphragm. This sensation is normal and will slowly go away on its own. °· The recovery time is shortened as long as there are no complications. °· You will rest in a recovery room until stable and doing well. As long as there are no complications, you may be allowed to go home. Someone will need to drive you home and be with you for at least 24 hours once home. °FINDING OUT THE RESULTS °You will be called with the results of the pathology and will discuss these results with  your caregiver during your postoperative appointment. Do not assume everything is normal if you have not heard from your caregiver or the medical facility. It is important for you to follow up on all of your results. °HOME CARE INSTRUCTIONS  °· Take all medicines as directed. °· Only take over-the-counter or prescription medicines for pain, discomfort, or fever as directed by your caregiver. °· Resume daily activities as directed. °· Showers are preferred over baths. °· You may resume sexual activities in 1 week or as directed. °· Do not drive while taking narcotics. °SEEK MEDICAL CARE IF:  °There is increasing abdominal pain. °· You feel lightheaded or faint. °· You have the chills. °· You have an oral temperature above 102° F (38.9° C). °· There is pus-like (purulent) drainage from any of the wounds. °· You are unable to pass gas or have a bowel movement. °· You feel sick to your stomach (nauseous) or throw up (vomit). °MAKE SURE YOU:  °· Understand these instructions. °· Will watch your condition. °· Will get  help right away if you are not doing well or get worse. ° °ExitCare® Patient Information ©2013 [employer]. ° °

## 2017-05-06 NOTE — Interval H&P Note (Signed)
History and Physical Interval Note  05/06/2017 12:57 PM  Lori Weaver  has presented today for surgery, with the diagnosis of Dysmenorrhea.  The various methods of treatment have been discussed with the patient and family. After consideration of risks, benefits and other options for treatment, the patient has consented to  Procedure(s): LAPAROSCOPY DIAGNOSTIC (N/A) as a surgical intervention .  The patient's history has been reviewed, patient examined, no change in status, stable for surgery.  I have reviewed the patient's chart and labs.  Questions were answered to the patient's satisfaction.   To OR when ready.   [name]  Katryn Plummer, MD, FACOG Attending Obstetrician & Gynecologist, [facility], [facility]

## 2017-05-06 NOTE — Transfer of Care (Signed)
Immediate Anesthesia Transfer of Care Note  Patient: Lori RectorJoi Purrington  Procedure(s) Performed: LAPAROSCOPY DIAGNOSTIC (N/A Abdomen)  Patient Location: PACU  Anesthesia Type:General  Level of Consciousness: sedated  Airway & Oxygen Therapy: Patient Spontanous Breathing and Patient connected to face mask oxygen  Post-op Assessment: Report given to RN and Post -op Vital signs reviewed and stable  Post vital signs: Reviewed and stable  Last Vitals:  Vitals:   05/06/17 1229  BP: 122/61  Pulse: 89  Resp: 18  Temp: 37.2 C  SpO2: 100%    Last Pain:  Vitals:   05/06/17 1229  TempSrc: Oral         Complications: No apparent anesthesia complications

## 2017-05-06 NOTE — Anesthesia Postprocedure Evaluation (Signed)
Anesthesia Post Note  Patient: Lori Weaver  Procedure(s) Performed: LAPAROSCOPY DIAGNOSTIC (N/A Abdomen)     Patient location during evaluation: PACU Anesthesia Type: General Level of consciousness: awake and alert Pain management: pain level controlled Vital Signs Assessment: post-procedure vital signs reviewed and stable Respiratory status: spontaneous breathing, nonlabored ventilation and respiratory function stable Cardiovascular status: blood pressure returned to baseline and stable Postop Assessment: no apparent nausea or vomiting Anesthetic complications: no    Last Vitals:  Vitals:   05/06/17 1545 05/06/17 1606  BP: 128/77 120/70  Pulse: 92 96  Resp: 15 18  Temp:  36.6 C  SpO2: 100% 100%    Last Pain:  Vitals:   05/06/17 1606  TempSrc:   PainSc: 2                  Cecile HearingStephen Edward Turk

## 2017-05-06 NOTE — Op Note (Signed)
[name] PROCEDURE DATE: 05/06/2017  PREOPERATIVE DIAGNOSIS: Dysmenorrhea POSTOPERATIVE DIAGNOSIS: The same PROCEDURE: Diagnostic laparoscopy SURGEON:  Dr. [name]  INDICATIONS: 21 y.o. No obstetric history on file. with history of chronic pelvic pain concerning for endometriosis desiring surgical evaluation.   Please see preoperative notes for further details.  Risks of surgery were discussed with the patient including but not limited to: bleeding which may require transfusion or reoperation; infection which may require antibiotics; injury to bowel, bladder, ureters or other surrounding organs; need for additional procedures including laparotomy; thromboembolic phenomenon, incisional problems and other postoperative/anesthesia complications. Written informed consent was obtained.    FINDINGS:  Small uterus, normal ovaries and fallopian tubes bilaterally.  Some peritoneal windows noted in left side of posterior cul-de-sac but no lesions of endometriosis seen. No evidence of PID.  No other abdominal/pelvic abnormality.  Normal upper abdomen.  ANESTHESIA:    General ESTIMATED BLOOD LOSS: 5 ml SPECIMENS: None COMPLICATIONS: None immediate  PROCEDURE IN DETAIL:  The patient had sequential compression devices applied to her lower extremities while in the preoperative area.  She was then taken to the operating room where general anesthesia was administered and was found to be adequate.  She was placed in the dorsal lithotomy position, and was prepped and draped in a sterile manner.  A Foley catheter was inserted into her bladder and attached to constant drainage and a uterine manipulator was then advanced into the uterus .  After an adequate timeout was performed, attention was turned to the abdomen where an umbilical incision was made with the scalpel.  The Optiview 5-mm trocar and sleeve were then advanced without difficulty with the laparoscope under direct visualization into the abdomen.  The  abdomen was then insufflated with carbon dioxide gas and adequate pneumoperitoneum was obtained.   A detailed survey of the patient's pelvis and abdomen revealed the findings as mentioned above. No intraoperative injury to surrounding organs was noted.  The abdomen was desufflated and all instruments were then removed from the patient's abdomen. The uterine manipulator was removed without complications.  The incision was closed with Dermabond. The patient tolerated the procedures well.  All instruments, needles, and sponge counts were correct x 2. The patient was taken to the recovery room in stable condition.   The patient will be discharged to home as per PACU criteria.  Routine postoperative instructions given.  She was prescribed Percocet, Ibuprofen and Colace.  She will follow up in the clinic in 2-3 weeks for postoperative evaluation .     [name]  Taleeya Blondin, MD, FACOG Attending Obstetrician & Gynecologist Faculty Practice, [facility]

## 2017-05-06 NOTE — H&P (View-Only) (Signed)
GYNECOLOGY OFFICE VISIT NOTE  History:  21 y.o. G0 here for discussion about evaluation of dysmenorrhea. She reports having painful menstrual periods since menarche, progressively getting worse.  Was started on Junel 1/20 and this helped a bit but now has gotten worse.  Has back pain thorough out cycle, but increased lower abdominal and pelvic cramping during periods. Periods last for 5 days during placebo week.  Takes Naproxen which sometimes helps. Patient is worried about and wants to be evaluated for endometriosis.  She denies any abnormal vaginal discharge or other GYN concerns but reports periodic blood in stool which she attributes to possible hemorrhoids. She is a Consulting civil engineerstudent at [employer].   Past Medical History:  Diagnosis Date  . UTI (urinary tract infection)     Past Surgical History:  Procedure Laterality Date  . TONSILLECTOMY      The following portions of the patient's history were reviewed and updated as appropriate: allergies, current medications, past family history, past medical history, past social history, past surgical history and problem list.   Review of Systems:  Pertinent items noted in HPI and remainder of comprehensive ROS otherwise negative.   Objective:  Physical Exam BP 128/71   Pulse 80   Temp 98.6 F (37 C) (Oral)   Ht 4\' 11"  (1.499 m)   Wt 124 lb (56.2 kg)   LMP 03/27/2017 (Exact Date)   BMI 25.04 kg/m  CONSTITUTIONAL: Well-developed, well-nourished female in no acute distress.  HENT:  Normocephalic, atraumatic. External right and left ear normal. Oropharynx is clear and moist EYES: Conjunctivae and EOM are normal. Pupils are equal, round, and reactive to light. No scleral icterus.  NECK: Normal range of motion, supple, no masses SKIN: Skin is warm and dry. No rash noted. Not diaphoretic. No erythema. No pallor. NEUROLOGIC: Alert and oriented to person, place, and time. Normal reflexes, muscle tone coordination. No cranial nerve deficit  noted. PSYCHIATRIC: Normal mood and affect. Normal behavior. Normal judgment and thought content. CARDIOVASCULAR: Normal heart rate noted RESPIRATORY: Effort and breath sounds normal, no problems with respiration noted ABDOMEN: Soft, no distention noted.   PELVIC: Deferred MUSCULOSKELETAL: Normal range of motion. No edema noted.  Labs and Imaging No results found.  Assessment & Plan:  1. Dysmenorrhea Discussed evaluation and management of dysmenorrhea in detail.  Discussed ultrasound evaluation and recommended diagnostic laparoscopy. Laparoscopy discussed in detail, risks/benefits/indications/alternatives also discussed.   She was told that she will be contacted by our surgical scheduler regarding the time and date of her surgery. Printed patient education handouts about the procedure were given to the patient to review at home. In the meantime, discussed management with continuous hormonal therapy with the Junel. If this is not as effective, will suggest increasing to a 30-35 mcg estradiol pill. Pelvic ultrasound also ordered to rule out any other structural anomaly. - norethindrone-ethinyl estradiol (JUNEL FE 1/20) 1-20 MG-MCG tablet; Take 1 tablet by mouth daily. Only take active/hormonal pills in a continuous fashion  Dispense: 3 Package; Refill: 8 - naproxen (NAPROSYN) 500 MG tablet; Take 1 tablet (500 mg total) by mouth 2 (two) times daily as needed. As needed for cramps. Take with meals.  Dispense: 30 tablet; Refill: 8 - US PELVIS (TRANSABDOMINAL ONLY); Future - US PELVIS TRANSVANGINAL NON-OB (TV ONLY); Future  2. Hemorrhoids, unspecified hemorrhoid type Will do trial of Anusol therapy. Told to follow up with PCP if condition persists or worsens.  - hydrocortisone (ANUSOL-HC) 25 MG suppository; Place 1 suppository (25 mg total) rectally  2 (two) times daily.  Dispense: 12 suppository; Refill: 2   Routine preventative health maintenance measures emphasized. Please refer to After Visit  Summary for other counseling recommendations.   Return if symptoms worsen or fail to improve.   Total face-to-face time with patient: 30 minutes. Over 50% of encounter was spent on counseling and coordination of care.   Ory Elting, MD, FACOG Attending Obstetrician & Gynecologist, [facility], [facility]  

## 2017-05-06 NOTE — Anesthesia Procedure Notes (Signed)
Procedure Name: Intubation Date/Time: 05/06/2017 2:28 PM Performed by: Maryella Shivers Pre-anesthesia Checklist: Patient identified, Emergency Drugs available, Suction available and Patient being monitored Patient Re-evaluated:Patient Re-evaluated prior to induction Oxygen Delivery Method: Circle system utilized Preoxygenation: Pre-oxygenation with 100% oxygen Induction Type: IV induction Ventilation: Mask ventilation without difficulty Laryngoscope Size: Mac and 3 Grade View: Grade I Tube type: Oral Tube size: 7.0 mm Number of attempts: 1 Airway Equipment and Method: Stylet and Oral airway Placement Confirmation: ETT inserted through vocal cords under direct vision,  positive ETCO2 and breath sounds checked- equal and bilateral Secured at: 19 cm Tube secured with: Tape Dental Injury: Teeth and Oropharynx as per pre-operative assessment

## 2017-05-07 ENCOUNTER — Encounter (HOSPITAL_BASED_OUTPATIENT_CLINIC_OR_DEPARTMENT_OTHER): Payer: Self-pay | Admitting: Obstetrics & Gynecology

## 2017-05-15 ENCOUNTER — Inpatient Hospital Stay (HOSPITAL_COMMUNITY)
Admission: AD | Admit: 2017-05-15 | Discharge: 2017-05-15 | Disposition: A | Discharge status: Home / Self Care | Payer: Medicaid Other | Source: Ambulatory Visit | Attending: Obstetrics and Gynecology | Admitting: Obstetrics and Gynecology

## 2017-05-15 ENCOUNTER — Encounter (HOSPITAL_COMMUNITY): Payer: Self-pay | Admitting: *Deleted

## 2017-05-15 DIAGNOSIS — R1084 Generalized abdominal pain: Secondary | ICD-10-CM | POA: Diagnosis not present

## 2017-05-15 DIAGNOSIS — F329 Major depressive disorder, single episode, unspecified: Secondary | ICD-10-CM | POA: Diagnosis not present

## 2017-05-15 DIAGNOSIS — R109 Unspecified abdominal pain: Secondary | ICD-10-CM | POA: Diagnosis present

## 2017-05-15 DIAGNOSIS — N946 Dysmenorrhea, unspecified: Secondary | ICD-10-CM | POA: Insufficient documentation

## 2017-05-15 LAB — CBC WITH DIFFERENTIAL/PLATELET
BASOS PCT: 0 %
Basophils Absolute: 0 10*3/uL (ref 0.0–0.1)
EOS ABS: 0 10*3/uL (ref 0.0–0.7)
EOS PCT: 1 %
HCT: 36.1 % (ref 36.0–46.0)
HEMOGLOBIN: 12.1 g/dL (ref 12.0–15.0)
LYMPHS ABS: 2.7 10*3/uL (ref 0.7–4.0)
Lymphocytes Relative: 33 %
MCH: 29.4 pg (ref 26.0–34.0)
MCHC: 33.5 g/dL (ref 30.0–36.0)
MCV: 87.8 fL (ref 78.0–100.0)
MONO ABS: 0.3 10*3/uL (ref 0.1–1.0)
MONOS PCT: 4 %
Neutro Abs: 5.2 10*3/uL (ref 1.7–7.7)
Neutrophils Relative %: 62 %
Platelets: 417 10*3/uL — ABNORMAL HIGH (ref 150–400)
RBC: 4.11 MIL/uL (ref 3.87–5.11)
RDW: 13.4 % (ref 11.5–15.5)
WBC: 8.3 10*3/uL (ref 4.0–10.5)

## 2017-05-15 MED ORDER — OXYCODONE-ACETAMINOPHEN 5-325 MG PO TABS: 1.0000 | ORAL_TABLET | Freq: Four times a day (QID) | ORAL | 0 refills | Status: DC | PRN

## 2017-05-15 MED ORDER — KETOROLAC TROMETHAMINE 60 MG/2ML IM SOLN
60.0000 mg | INTRAMUSCULAR | Status: AC
  Administered 2017-05-15: 60 mg via INTRAMUSCULAR
  Filled 2017-05-15: qty 2

## 2017-05-15 NOTE — MAU Provider Note (Signed)
History     CSN: [id number]  Arrival date and time: 05/15/17 1331   First Provider Initiated Contact with Patient 05/15/17 1525      Chief Complaint  Patient presents with  . Abdominal Pain   HPI  [name] is a 21 y.o. non-pregnant female presenting to MAU with complaints of generalized abdominal pain. She had laparoscopic surgery looking for endometriosis on 10/17, was given Percocet and Motrin. She states that she was not aware that she would have as much pain after this surgery as she did. She reports that she "ran out" of Percocet and is in pain. She is not taking Motrin on a regular basis. She is having regular BMs without taking Colace and. She also reports that no one has talked to her about her results from the laparoscopic procedure nor about when to F/U. She denies fever or N/V/D. Requesting to eat prior to evaluation.  Past Medical History:  Diagnosis Date  . Depression   . Dysmenorrhea 04/2017    Past Surgical History:  Procedure Laterality Date  . LAPAROSCOPY N/A 05/06/2017   Procedure: LAPAROSCOPY DIAGNOSTIC;  Surgeon: Tereso Newcomer, MD;  Location: [facility];  Service: Gynecology;  Laterality: N/A;  . TONSILLECTOMY AND ADENOIDECTOMY    . TYMPANOSTOMY TUBE PLACEMENT Bilateral     Family History  Problem Relation Age of Onset  . Lupus Mother   . Lupus Maternal Grandmother     Social History  Substance Use Topics  . Smoking status: Never Smoker  . Smokeless tobacco: Never Used  . Alcohol use No    Allergies:  Allergies  Allergen Reactions  . Fish Allergy Anaphylaxis  . Soap Rash    Prescriptions Prior to Admission  Medication Sig Dispense Refill Last Dose  . buPROPion (WELLBUTRIN XL) 150 MG 24 hr tablet Take 75 mg by mouth daily.    05/06/2017 at Unknown time  . docusate sodium (COLACE) 100 MG capsule Take 1 capsule (100 mg total) by mouth 2 (two) times daily as needed. 30 capsule 2   . hydrOXYzine (ATARAX/VISTARIL) 25 MG  tablet Take 25 mg by mouth at bedtime.   05/05/2017 at Unknown time  . naproxen (NAPROSYN) 500 MG tablet Take 1 tablet (500 mg total) by mouth 2 (two) times daily with a meal. As needed for cramps. Take with meals. 30 tablet 8   . norethindrone-ethinyl estradiol (JUNEL FE 1/20) 1-20 MG-MCG tablet Take 1 tablet by mouth daily. Only take active/hormonal pills in a continuous fashion 3 Package 8 05/05/2017 at Unknown time  . oxyCODONE-acetaminophen (PERCOCET/ROXICET) 5-325 MG tablet Take 1-2 tablets by mouth every 6 (six) hours as needed. 30 tablet 0     Review of Systems  Constitutional: Negative.   HENT: Negative.   Eyes: Negative.   Respiratory: Negative.   Cardiovascular: Negative.   Gastrointestinal: Positive for abdominal pain (generalized). Negative for constipation, diarrhea and vomiting.  Endocrine: Negative.   Genitourinary: Negative.   Musculoskeletal: Negative.   Skin: Negative.   Allergic/Immunologic: Negative.   Neurological: Negative.   Hematological: Negative.   Psychiatric/Behavioral: Negative.    Physical Exam   Blood pressure (!) 111/54, pulse 91, temperature 99.3 F (37.4 C), temperature source Oral, resp. rate 16, weight 130 lb 12 oz (59.3 kg), SpO2 100 %.  Physical Exam  Nursing note and vitals reviewed. Constitutional: She is oriented to person, place, and time. She appears well-developed and well-nourished.  HENT:  Head: Normocephalic.  Eyes: Pupils are equal, round, and  reactive to light.  Neck: Normal range of motion.  Cardiovascular: Normal rate, regular rhythm and normal heart sounds.   Respiratory: Effort normal and breath sounds normal.  GI: Soft. Bowel sounds are normal. She exhibits distension (mild). She exhibits no mass. There is tenderness (increased with palpation). There is no rebound and no guarding.  Genitourinary:  Genitourinary Comments: Pelvic deferred  Musculoskeletal: Normal range of motion.  Neurological: She is alert and oriented to  person, place, and time.  Skin: Skin is warm and dry.  Psychiatric: She has a normal mood and affect. Her behavior is normal. Judgment and thought content normal.    MAU Course  Procedures  MDM CBCD Toradol 60 mg IM -- pain improved Results for orders placed or performed during the hospital encounter of 05/15/17 (from the past 24 hour(s))  CBC with Differential/Platelet     Status: Abnormal   Collection Time: 05/15/17  3:50 PM  Result Value Ref Range   WBC 8.3 4.0 - 10.5 K/uL   RBC 4.11 3.87 - 5.11 MIL/uL   Hemoglobin 12.1 12.0 - 15.0 g/dL   HCT 40.936.1 81.136.0 - 91.446.0 %   MCV 87.8 78.0 - 100.0 fL   MCH 29.4 26.0 - 34.0 pg   MCHC 33.5 30.0 - 36.0 g/dL   RDW 78.213.4 95.611.5 - 21.315.5 %   Platelets 417 (H) 150 - 400 K/uL   Neutrophils Relative % 62 %   Neutro Abs 5.2 1.7 - 7.7 K/uL   Lymphocytes Relative 33 %   Lymphs Abs 2.7 0.7 - 4.0 K/uL   Monocytes Relative 4 %   Monocytes Absolute 0.3 0.1 - 1.0 K/uL   Eosinophils Relative 1 %   Eosinophils Absolute 0.0 0.0 - 0.7 K/uL   Basophils Relative 0 %   Basophils Absolute 0.0 0.0 - 0.1 K/uL    Assessment and Plan  Generalized abdominal pain - Rx for Percocet 5/325 1 tab every 6 hrs prn pain - Alternate Motrin 800 mg with Percocet - Someone from [facility] will call to schedule F/U appt in 1-2 wks with an MD  Discharge home Patient verbalized an understanding of the plan of care and agrees.   Raelyn Moraolitta Jane Birkel, MSN, CNM 05/15/2017, 3:31 PM

## 2017-05-15 NOTE — MAU Note (Signed)
Had surgery last wk, dx laproscopic last wk.  Has not received results or f/u appt yet.  Was prescribed pain medicine, didn't think the pain would be as bad as it is. Has run out of medication.  Called office today, was told to come here.

## 2017-05-15 NOTE — MAU Note (Signed)
Pt presents to MAU with c/o abdominal pain.  Reports had diagnostic lap last Wednesday, October 17 for dysmenorrhea.  Hasn't had f/u appt yet, and unaware of results.  Pt states she's used all the pain med prescribed after the surgery.

## 2017-05-15 NOTE — MAU Note (Signed)
Urine in lab 

## 2017-06-10 ENCOUNTER — Encounter: Payer: Self-pay | Admitting: Obstetrics & Gynecology

## 2017-06-10 ENCOUNTER — Ambulatory Visit (INDEPENDENT_AMBULATORY_CARE_PROVIDER_SITE_OTHER): Payer: Medicaid Other | Admitting: Obstetrics & Gynecology

## 2017-06-10 VITALS — BP 103/64 | HR 75 | Wt 133.5 lb

## 2017-06-10 DIAGNOSIS — Z9889 Other specified postprocedural states: Secondary | ICD-10-CM

## 2017-06-10 DIAGNOSIS — N946 Dysmenorrhea, unspecified: Secondary | ICD-10-CM | POA: Diagnosis not present

## 2017-06-10 DIAGNOSIS — Z09 Encounter for follow-up examination after completed treatment for conditions other than malignant neoplasm: Secondary | ICD-10-CM

## 2017-06-10 NOTE — Progress Notes (Signed)
Subjective:     Lori Weaver is a 21 y.o. G0 female who presents to the clinic 4 weeks status post diagnostic laparoscopy for pelvic pain and dysmenorrhea. No endometriosis or PID was noted on laparoscopy.   Eating a regular diet without difficulty. Bowel movements are normal. The patient is not having any pain.  The following portions of the patient's history were reviewed and updated as appropriate: allergies, current medications, past family history, past medical history, past social history, past surgical history and problem list.  Review of Systems Pertinent items noted in HPI and remainder of comprehensive ROS otherwise negative.    Objective:    BP 103/64   Pulse 75   Wt 133 lb 8 oz (60.6 kg)   BMI 26.96 kg/m  General:  alert and no distress  Abdomen: soft, bowel sounds active, non-tender  Incision:   healing well, no drainage, no erythema, no hernia, no seroma, no swelling, no dehiscence, incision well approximated     Assessment:    Doing well postoperatively. Operative findings again reviewed. Pathology report discussed.    Plan:   1. Continue any current medications. 2. Activity restrictions: none 3. Anticipated return to work: not applicable. 4. Follow up as needed.   Lori CollinsUGONNA  Teylor Wolven, MD, FACOG Attending Obstetrician & Gynecologist, [facility], [facility]

## 2017-06-10 NOTE — Patient Instructions (Signed)
Return to clinic for any scheduled appointments or for any gynecologic concerns as needed.   

## 2017-11-27 DIAGNOSIS — F321 Major depressive disorder, single episode, moderate: Secondary | ICD-10-CM | POA: Diagnosis not present

## 2017-12-02 ENCOUNTER — Ambulatory Visit (INDEPENDENT_AMBULATORY_CARE_PROVIDER_SITE_OTHER): Payer: Medicaid Other | Admitting: Family Medicine

## 2017-12-02 ENCOUNTER — Encounter: Payer: Self-pay | Admitting: Family Medicine

## 2017-12-02 VITALS — BP 121/80 | HR 85 | Temp 98.7°F | Ht <= 58 in | Wt 133.6 lb

## 2017-12-02 DIAGNOSIS — Z7689 Persons encountering health services in other specified circumstances: Secondary | ICD-10-CM | POA: Insufficient documentation

## 2017-12-02 DIAGNOSIS — L700 Acne vulgaris: Secondary | ICD-10-CM

## 2017-12-02 DIAGNOSIS — F329 Major depressive disorder, single episode, unspecified: Secondary | ICD-10-CM | POA: Insufficient documentation

## 2017-12-02 DIAGNOSIS — Z840 Family history of diseases of the skin and subcutaneous tissue: Secondary | ICD-10-CM | POA: Diagnosis not present

## 2017-12-02 DIAGNOSIS — F32A Depression, unspecified: Secondary | ICD-10-CM | POA: Insufficient documentation

## 2017-12-02 MED ORDER — METRONIDAZOLE 1 % EX GEL: Freq: Every day | CUTANEOUS | 11 refills | Status: DC

## 2017-12-02 MED ORDER — IBUPROFEN 800 MG PO TABS: 800.0000 mg | ORAL_TABLET | Freq: Three times a day (TID) | ORAL | 0 refills | Status: DC | PRN

## 2017-12-02 NOTE — Patient Instructions (Signed)
It was nice meeting you today Norelle!  If you have any questions or concerns, please feel free to call the clinic.   Be well,  Dr. Frances Furbish

## 2017-12-02 NOTE — Assessment & Plan Note (Signed)
Will obtain CBC, BMP, ESR, and CRP to screen for lupus today due to patient's strong family history and preference for testing.  Patient was advised on the clinical symptoms for lupus and that there is not one test that we can do to definitively diagnose it currently.

## 2017-12-02 NOTE — Assessment & Plan Note (Signed)
Will prescribe metrogel in addition to patient's benzoyl peroxide treatment.

## 2017-12-02 NOTE — Progress Notes (Signed)
   Subjective:    Lori Weaver - 22 y.o. female MRN [id number]  Date of birth: 10/29/1995  HPI  Lori Weaver is here to establish care.  Current concerns include: wants test for lupus due to strong family history and would like acne addressed.  PMH: depression and anxiety (sees psychiatry and was hospitalized in 2014 for anxiety), pelvic pain that has now resolved with drinking herbal tea, acne  PSH: laparoscopic surgery to view uterus d/t concerns for endometriosis (negative)  FH: mother and grandmother with lupus.  Mother died of lupus four years ago.  Mother with depression and anxiety  Meds: Wellbutrin, Hydroxyzine   Allergies: fish and pollen  Social Hx: No longer taking birth control due to vaginal bleeding, does not use condoms regularly, but is monogamous, goes to school at [employer].  Not currently interested in birth control and would be okay with becoming pregnant.  No tobacco or alcohol use, but does use marijuana occasionally.  Lives with boyfriend.  Health Maintenance:  Health Maintenance Due  Topic Date Due  . HIV Screening  08/22/2012  . TETANUS/TDAP  08/22/2016    -  reports that she has never smoked. She has never used smokeless tobacco. - Review of Systems: Per HPI. - Past Medical History: Patient Active Problem List   Diagnosis Date Noted  . Encounter to establish care 12/02/2017  . Family history of lupus erythematosus 12/02/2017  . Depression 12/02/2017  . Acne vulgaris 12/02/2017  . Dysmenorrhea 05/06/2017   - Medications: reviewed and updated   Objective:   Physical Exam BP 121/80   Pulse 85   Temp 98.7 F (37.1 C) (Oral)   Ht '4\' 10"'$  (1.473 m)   Wt 133 lb 9.6 oz (60.6 kg)   LMP 11/30/2017   SpO2 100%   BMI 27.92 kg/m  Gen: NAD, alert, cooperative with exam, well-appearing, tearful when discussing her mother HEENT: NCAT, PERRL, clear conjunctiva, oropharynx clear, supple neck CV: RRR, good S1/S2, no murmur, no edema, capillary refill brisk  Resp:  CTABL, no wheezes, non-labored Abd: SNTND, BS present, no guarding or organomegaly Skin: no rashes, normal turgor  Neuro: no gross deficits.  Psych: good insight, alert and oriented        Assessment & Plan:   Encounter to establish care Information reviewed and updated in chart.  Patient encouraged to see me again if she would like to discuss birth control options.  Will see her in one year for her yearly physical and pap smear.  Depression Managed by psychiatry.  Family history of lupus erythematosus Will obtain CBC, BMP, ESR, and CRP to screen for lupus today due to patient's strong family history and preference for testing.  Patient was advised on the clinical symptoms for lupus and that there is not one test that we can do to definitively diagnose it currently.  Acne vulgaris Will prescribe metrogel in addition to patient's benzoyl peroxide treatment.    Maia Breslow, M.D. 12/02/2017, 5:08 PM PGY-1, [place]

## 2017-12-02 NOTE — Assessment & Plan Note (Signed)
Information reviewed and updated in chart.  Patient encouraged to see me again if she would like to discuss birth control options.  Will see her in one year for her yearly physical and pap smear.

## 2017-12-02 NOTE — Assessment & Plan Note (Signed)
Managed by psychiatry 

## 2017-12-03 ENCOUNTER — Encounter: Payer: Self-pay | Admitting: *Deleted

## 2017-12-03 ENCOUNTER — Ambulatory Visit: Payer: Medicaid Other | Admitting: Family Medicine

## 2017-12-03 LAB — BASIC METABOLIC PANEL
BUN/Creatinine Ratio: 13 (ref 9–23)
BUN: 10 mg/dL (ref 6–20)
CALCIUM: 9.6 mg/dL (ref 8.7–10.2)
CHLORIDE: 103 mmol/L (ref 96–106)
CO2: 24 mmol/L (ref 20–29)
Creatinine, Ser: 0.78 mg/dL (ref 0.57–1.00)
GFR calc non Af Amer: 110 mL/min/{1.73_m2} (ref >59)
GFR, EST AFRICAN AMERICAN: 127 mL/min/{1.73_m2} (ref >59)
GLUCOSE: 76 mg/dL (ref 65–99)
POTASSIUM: 4 mmol/L (ref 3.5–5.2)
Sodium: 139 mmol/L (ref 134–144)

## 2017-12-03 LAB — CBC
Hematocrit: 35.5 % (ref 34.0–46.6)
Hemoglobin: 12 g/dL (ref 11.1–15.9)
MCH: 28.4 pg (ref 26.6–33.0)
MCHC: 33.8 g/dL (ref 31.5–35.7)
MCV: 84 fL (ref 79–97)
PLATELETS: 352 10*3/uL (ref 150–379)
RBC: 4.22 x10E6/uL (ref 3.77–5.28)
RDW: 12.8 % (ref 12.3–15.4)
WBC: 3.5 10*3/uL (ref 3.4–10.8)

## 2017-12-03 LAB — SEDIMENTATION RATE: SED RATE: 12 mm/h (ref 0–32)

## 2017-12-03 LAB — C-REACTIVE PROTEIN: CRP: 0.5 mg/L (ref 0.0–4.9)

## 2017-12-18 ENCOUNTER — Ambulatory Visit: Payer: Medicaid Other | Admitting: Family Medicine

## 2017-12-29 ENCOUNTER — Emergency Department (HOSPITAL_COMMUNITY)
Admission: EM | Admit: 2017-12-29 | Discharge: 2017-12-29 | Disposition: A | Discharge status: Home / Self Care | Payer: BLUE CROSS/BLUE SHIELD | Attending: Emergency Medicine | Admitting: Emergency Medicine

## 2017-12-29 ENCOUNTER — Other Ambulatory Visit: Payer: Self-pay

## 2017-12-29 ENCOUNTER — Encounter (HOSPITAL_COMMUNITY): Payer: Self-pay | Admitting: Emergency Medicine

## 2017-12-29 DIAGNOSIS — R1030 Lower abdominal pain, unspecified: Secondary | ICD-10-CM | POA: Diagnosis present

## 2017-12-29 DIAGNOSIS — N912 Amenorrhea, unspecified: Secondary | ICD-10-CM | POA: Insufficient documentation

## 2017-12-29 DIAGNOSIS — R1033 Periumbilical pain: Secondary | ICD-10-CM | POA: Diagnosis not present

## 2017-12-29 DIAGNOSIS — Z79899 Other long term (current) drug therapy: Secondary | ICD-10-CM | POA: Diagnosis not present

## 2017-12-29 DIAGNOSIS — R1032 Left lower quadrant pain: Secondary | ICD-10-CM | POA: Diagnosis not present

## 2017-12-29 DIAGNOSIS — N3090 Cystitis, unspecified without hematuria: Secondary | ICD-10-CM | POA: Diagnosis not present

## 2017-12-29 LAB — URINALYSIS, ROUTINE W REFLEX MICROSCOPIC
BILIRUBIN URINE: NEGATIVE
Glucose, UA: NEGATIVE mg/dL
HGB URINE DIPSTICK: NEGATIVE
Ketones, ur: 20 mg/dL — AB
NITRITE: NEGATIVE
PROTEIN: 30 mg/dL — AB
SPECIFIC GRAVITY, URINE: 1.024 (ref 1.005–1.030)
pH: 5 (ref 5.0–8.0)

## 2017-12-29 LAB — CBC
HEMATOCRIT: 35.9 % — AB (ref 36.0–46.0)
HEMOGLOBIN: 11.9 g/dL — AB (ref 12.0–15.0)
MCH: 28.3 pg (ref 26.0–34.0)
MCHC: 33.1 g/dL (ref 30.0–36.0)
MCV: 85.5 fL (ref 78.0–100.0)
Platelets: 375 10*3/uL (ref 150–400)
RBC: 4.2 MIL/uL (ref 3.87–5.11)
RDW: 12.6 % (ref 11.5–15.5)
WBC: 4.9 10*3/uL (ref 4.0–10.5)

## 2017-12-29 LAB — COMPREHENSIVE METABOLIC PANEL
ALBUMIN: 4.2 g/dL (ref 3.5–5.0)
ALT: 20 U/L (ref 14–54)
ANION GAP: 9 (ref 5–15)
AST: 22 U/L (ref 15–41)
Alkaline Phosphatase: 62 U/L (ref 38–126)
BILIRUBIN TOTAL: 0.9 mg/dL (ref 0.3–1.2)
BUN: 5 mg/dL — AB (ref 6–20)
CHLORIDE: 104 mmol/L (ref 101–111)
CO2: 23 mmol/L (ref 22–32)
Calcium: 9.1 mg/dL (ref 8.9–10.3)
Creatinine, Ser: 0.99 mg/dL (ref 0.44–1.00)
GFR calc Af Amer: >60 mL/min (ref >60)
GFR calc non Af Amer: >60 mL/min (ref >60)
GLUCOSE: 84 mg/dL (ref 65–99)
POTASSIUM: 3.6 mmol/L (ref 3.5–5.1)
SODIUM: 136 mmol/L (ref 135–145)
TOTAL PROTEIN: 6.7 g/dL (ref 6.5–8.1)

## 2017-12-29 LAB — I-STAT BETA HCG BLOOD, ED (MC, WL, AP ONLY)

## 2017-12-29 LAB — LIPASE, BLOOD: LIPASE: 27 U/L (ref 11–51)

## 2017-12-29 MED ORDER — SODIUM CHLORIDE 0.9 % IV SOLN
1.0000 g | Freq: Once | INTRAVENOUS | Status: AC
  Administered 2017-12-29: 1 g via INTRAVENOUS
  Filled 2017-12-29: qty 10

## 2017-12-29 MED ORDER — SODIUM CHLORIDE 0.9 % IV BOLUS
1000.0000 mL | Freq: Once | INTRAVENOUS | Status: AC
  Administered 2017-12-29: 1000 mL via INTRAVENOUS

## 2017-12-29 MED ORDER — KETOROLAC TROMETHAMINE 15 MG/ML IJ SOLN
15.0000 mg | Freq: Once | INTRAMUSCULAR | Status: AC
  Administered 2017-12-29: 15 mg via INTRAVENOUS
  Filled 2017-12-29: qty 1

## 2017-12-29 MED ORDER — CEPHALEXIN 500 MG PO CAPS: 500.0000 mg | ORAL_CAPSULE | Freq: Two times a day (BID) | ORAL | 0 refills | Status: AC

## 2017-12-29 NOTE — ED Notes (Signed)
Pt discharged from ED; instructions provided and scripts given; Pt encouraged to return to ED if symptoms worsen and to f/u with PCP; Pt verbalized understanding of all instructions 

## 2017-12-29 NOTE — ED Triage Notes (Signed)
Pt c/o low mid abd pain x 4 days, denies n/v/d, denies urinary s/s, denies gyn s/s.  Pt reports period is 5 days late, pt states she did take pregnancy test yesterday, negative result..Marland Kitchen

## 2017-12-29 NOTE — ED Provider Notes (Signed)
[facility] Provider Note   CSN: [id number] Arrival date & time: 12/29/17  0555     History   Chief Complaint Chief Complaint  Patient presents with  . Abdominal Pain    HPI Lori RectorJoi Weaver is a 22 y.o. female with no chronic medical problems who presents to the emergency department with a chief complaint of amenorrhea.  The patient reports that she is 5 days late for her menstrual cycle.  She reports regular monthly menstrual cycles for the last several years.  She has never missed a cycle or been late.  She reports that she took a home pregnancy test yesterday, which was negative.  She also endorses bilateral lower abdominal pain for the last 4 days.  She states the pain is constant, and she characterizes as " pressure" and "cramping".  She reports associated bilateral low back pain, urinary urgency, and urinary frequency.  She denies fever, chills, nausea, vomiting, diarrhea, constipation, melena, hematochezia, hematuria, vaginal bleeding itching, or pain.  She reports has had a small amount of clear vaginal discharge that does not have an odor, but states that this is normal.  She is currently sexually active with one female partner.  Last sexual intercourse was within the last week.  She does not currently take birth control.  She was previously taking OCPs, but stopped in February 2019.  The history is provided by the patient. No language interpreter was used.  Abdominal Pain   Associated symptoms include frequency. Pertinent negatives include fever, diarrhea, nausea, vomiting, constipation, dysuria, headaches and myalgias.    Past Medical History:  Diagnosis Date  . Allergy   . Anxiety   . Depression   . Dysmenorrhea 04/2017  . Dysmenorrhea     Patient Active Problem List   Diagnosis Date Noted  . Encounter to establish care 12/02/2017  . Family history of lupus erythematosus 12/02/2017  . Depression 12/02/2017  . Acne vulgaris  12/02/2017  . Dysmenorrhea 05/06/2017    Past Surgical History:  Procedure Laterality Date  . LAPAROSCOPY N/A 05/06/2017   Procedure: LAPAROSCOPY DIAGNOSTIC;  Surgeon: Tereso NewcomerAnyanwu, Ugonna A, MD;  Location: [facility];  Service: Gynecology;  Laterality: N/A;  . LAPAROSCOPY ABDOMEN DIAGNOSTIC    . TONSILLECTOMY AND ADENOIDECTOMY    . TYMPANOSTOMY TUBE PLACEMENT Bilateral      OB History   None      Home Medications    Prior to Admission medications   Medication Sig Start Date End Date Taking? Authorizing Provider  buPROPion (WELLBUTRIN XL) 150 MG 24 hr tablet Take 150 mg by mouth daily.    Yes [provider]  hydrOXYzine (ATARAX/VISTARIL) 25 MG tablet Take 25 mg by mouth at bedtime.   Yes [provider]  ibuprofen (ADVIL,MOTRIN) 800 MG tablet Take 1 tablet (800 mg total) by mouth every 8 (eight) hours as needed. 12/02/17  Yes Winfrey, Harlen LabsAmanda C, MD  metroNIDAZOLE (METROGEL) 1 % gel Apply topically daily. Apply after cleansing face.  Makeup may be used after applying product. Patient taking differently: Apply 1 application topically daily. Apply after cleansing face.  Makeup may be used after applying product. 12/02/17  Yes Winfrey, Harlen LabsAmanda C, MD  naproxen (NAPROSYN) 500 MG tablet Take 1 tablet (500 mg total) by mouth 2 (two) times daily with a meal. As needed for cramps. Take with meals. 05/06/17  Yes Anyanwu, Jethro BastosUgonna A, MD  cephALEXin (KEFLEX) 500 MG capsule Take 1 capsule (500 mg total) by mouth 2 (two)  times daily for 7 days. 12/29/17 01/05/18  Sheina Mcleish, Coral Else, PA-C    Family History Family History  Problem Relation Age of Onset  . Lupus Mother   . Depression Mother   . Anxiety disorder Mother   . Lupus Maternal Grandmother     Social History Social History   Tobacco Use  . Smoking status: Never Smoker  . Smokeless tobacco: Never Used  Substance Use Topics  . Alcohol use: No  . Drug use: Not Currently    Types: Marijuana     Allergies     Fish allergy and Soap   Review of Systems Review of Systems  Constitutional: Negative for activity change, chills, diaphoresis and fever.  Respiratory: Negative for shortness of breath.   Cardiovascular: Negative for chest pain.  Gastrointestinal: Positive for abdominal pain. Negative for abdominal distention, anal bleeding, blood in stool, constipation, diarrhea, nausea and vomiting.  Genitourinary: Positive for frequency and urgency. Negative for dysuria, vaginal bleeding and vaginal pain.  Musculoskeletal: Negative for back pain, myalgias and neck pain.  Skin: Negative for rash.  Allergic/Immunologic: Negative for immunocompromised state.  Neurological: Negative for seizures, syncope, weakness and headaches.  Psychiatric/Behavioral: Negative for confusion.   Physical Exam Updated Vital Signs BP 135/78   Pulse 96   Temp 98.9 F (37.2 C) (Oral)   Resp 16   Ht 4\' 10"  (1.473 m)   Wt 60.8 kg (134 lb)   LMP 11/30/2017   SpO2 100%   BMI 28.01 kg/m   Physical Exam  Constitutional: No distress.  HENT:  Head: Normocephalic.  Eyes: Conjunctivae are normal.  Neck: Neck supple.  Cardiovascular: Normal rate, regular rhythm, normal heart sounds and intact distal pulses. Exam reveals no gallop and no friction rub.  No murmur heard. Pulmonary/Chest: Effort normal and breath sounds normal. No stridor. No respiratory distress. She has no wheezes. She has no rales. She exhibits no tenderness.  Abdominal: Soft. Bowel sounds are normal. She exhibits no distension and no mass. There is tenderness. There is no rebound and no guarding. No hernia.  Tender to palpation in the suprapubic and left lower quadrant without rebound or guarding.  Abdomen is soft, nondistended.  No peritoneal signs.  No CVA tenderness bilaterally.  Negative Murphy sign.  No tenderness over McBurney's point.  Musculoskeletal: Normal range of motion. She exhibits no edema, tenderness or deformity.  Mildly tender to  palpation in the bilateral lumbar musculature.  No tenderness to the cervical, thoracic, or lumbar spinous processes.  Neurological: She is alert.  Skin: Skin is warm. Capillary refill takes less than 2 seconds. No rash noted.  Psychiatric: Her behavior is normal.  Nursing note and vitals reviewed.  ED Treatments / Results  Labs (all labs ordered are listed, but only abnormal results are displayed) Labs Reviewed  COMPREHENSIVE METABOLIC PANEL - Abnormal; Notable for the following components:      Result Value   BUN 5 (*)    All other components within normal limits  CBC - Abnormal; Notable for the following components:   Hemoglobin 11.9 (*)    HCT 35.9 (*)    All other components within normal limits  URINALYSIS, ROUTINE W REFLEX MICROSCOPIC - Abnormal; Notable for the following components:   APPearance HAZY (*)    Ketones, ur 20 (*)    Protein, ur 30 (*)    Leukocytes, UA TRACE (*)    Bacteria, UA RARE (*)    All other components within normal limits  URINE CULTURE  LIPASE, BLOOD  I-STAT BETA HCG BLOOD, ED ([facility], [facility], [facility] ONLY)    EKG None  Radiology No results found.  Procedures Procedures (including critical care time)  Medications Ordered in ED Medications  ketorolac (TORADOL) 15 MG/ML injection 15 mg (has no administration in time range)  sodium chloride 0.9 % bolus 1,000 mL (1,000 mLs Intravenous New Bag/Given 12/29/17 1315)  cefTRIAXone (ROCEPHIN) 1 g in sodium chloride 0.9 % 100 mL IVPB (1 g Intravenous New Bag/Given 12/29/17 1351)     Initial Impression / Assessment and Plan / ED Course  I have reviewed the triage vital signs and the nursing notes.  Pertinent labs & imaging results that were available during my care of the patient were reviewed by me and considered in my medical decision making (see chart for details).     22 year old female with no pertinent past medical history presenting with amenorrhea, abdominal pain, urinary frequency and hesitancy.  She  is a 5 days late for her menstrual cycle.  No history of similar.  Pregnancy test is negative.  Labs are notable for urinalysis with pyuria, bacteria, ketonuria, and mild proteinuria.  She denies vaginal complaints and declines a pelvic exam at this time.  She is established with an OB/GYN.  UA is concerning for UTI and mild dehydration.  IV fluid bolus and Rocephin given in the ED. Toradol given for pain control. Urine culture sent.   Patient is nontoxic, nonseptic appearing, in no apparent distress. Patient does not meet the SIRS or Sepsis criteria.  On repeat exam patient does not have a surgical abdomen and there are no peritoneal signs.  No indication of appendicitis, bowel obstruction, bowel perforation, cholecystitis, diverticulitis, PID or ectopic pregnancy.    Patient discharged home with Keflex for UTI and given strict instructions for follow-up with her OBGYN.  I have also discussed reasons to return immediately to the ER.  Patient expresses understanding and agrees with plan.  Final Clinical Impressions(s) / ED Diagnoses   Final diagnoses:  Cystitis  Amenorrhea    ED Discharge Orders        Ordered    cephALEXin (KEFLEX) 500 MG capsule  2 times daily     12/29/17 1446       Lilia Letterman A, PA-C 12/29/17 1500    Maia Plan, MD 12/30/17 [phone]

## 2017-12-29 NOTE — Discharge Instructions (Signed)
Thank you for allowing me to provide your care today in the emergency department.  Your urine looked consistent with some mild dehydration and a urinary tract infection.  You were given a dose of Rocephin, and antibiotic, and fluids in the emergency department.  Your urine has been sent for a culture, but may take several days before it comes back.  You may receive a call from the emergency department if your urine grows a bacteria that Keflex will not cover.  Keflex is an antibiotic that is typically used to treat urinary tract infections.  Please take 1 tablet 2 times daily for the next 7 days.  Take your first dose tonight.  Take 600 mg of ibuprofen with food or 650 mg of Tylenol every 6 hours for abdominal cramping.   Please follow-up with your OB/GYN regarding the fact that your period this month is not started.  Your pregnancy test in the emergency department was negative.  Return to the emergency department if you develop high fever despite taking ibuprofen or Tylenol, persistent vomiting, vaginal discharge that is thicker has an odor, blood in your urine, or other new concerning symptoms.

## 2017-12-31 LAB — URINE CULTURE: Culture: <10000 — AB

## 2018-01-19 ENCOUNTER — Other Ambulatory Visit: Payer: Self-pay

## 2018-01-19 ENCOUNTER — Encounter: Payer: Self-pay | Admitting: Family Medicine

## 2018-01-19 ENCOUNTER — Other Ambulatory Visit (HOSPITAL_COMMUNITY)
Admission: RE | Admit: 2018-01-19 | Discharge: 2018-01-19 | Disposition: A | Discharge status: Home / Self Care | Payer: BLUE CROSS/BLUE SHIELD | Source: Ambulatory Visit | Attending: Family Medicine | Admitting: Family Medicine

## 2018-01-19 ENCOUNTER — Ambulatory Visit (INDEPENDENT_AMBULATORY_CARE_PROVIDER_SITE_OTHER): Payer: BLUE CROSS/BLUE SHIELD | Admitting: Family Medicine

## 2018-01-19 VITALS — BP 105/80 | HR 84 | Temp 98.6°F | Wt 132.0 lb

## 2018-01-19 DIAGNOSIS — Z113 Encounter for screening for infections with a predominantly sexual mode of transmission: Secondary | ICD-10-CM | POA: Insufficient documentation

## 2018-01-19 DIAGNOSIS — L700 Acne vulgaris: Secondary | ICD-10-CM

## 2018-01-19 DIAGNOSIS — N941 Unspecified dyspareunia: Secondary | ICD-10-CM | POA: Insufficient documentation

## 2018-01-19 DIAGNOSIS — L249 Irritant contact dermatitis, unspecified cause: Secondary | ICD-10-CM

## 2018-01-19 DIAGNOSIS — N926 Irregular menstruation, unspecified: Secondary | ICD-10-CM

## 2018-01-19 LAB — POCT WET PREP (WET MOUNT)
Clue Cells Wet Prep Whiff POC: NEGATIVE
TRICHOMONAS WET PREP HPF POC: ABSENT

## 2018-01-19 LAB — POCT URINE PREGNANCY: Preg Test, Ur: NEGATIVE

## 2018-01-19 MED ORDER — HYDROCORTISONE 1 % EX OINT: 1.0000 "application " | TOPICAL_OINTMENT | Freq: Two times a day (BID) | CUTANEOUS | 0 refills | Status: DC

## 2018-01-19 MED ORDER — HYDROCORTISONE 1 % EX OINT: 1.0000 "application " | TOPICAL_OINTMENT | Freq: Two times a day (BID) | CUTANEOUS | 0 refills | Status: AC

## 2018-01-19 MED ORDER — METRONIDAZOLE 1 % EX GEL: Freq: Every day | CUTANEOUS | 11 refills | Status: DC

## 2018-01-19 NOTE — Progress Notes (Signed)
Subjective   Patient ID: Lori Weaver    DOB: 01/17/1996, 22 y.o. female   MRN: [id number]  CC: "Rash"  HPI: Lori Weaver is a 22 y.o. female who presents to clinic today for the following:  RASH  Had rash for 3 days. Location: perineal Medications tried: famine body wash (thinks this is source) Similar rash in past: yes with body wash mentioned above New medications or antibiotics: yes, Keflex for UTI 12/29/2017 Tick, Insect or new pet exposure: no Recent travel: no New detergent or soap: yes, see above Immunocompromised: no  Symptoms Itching: yes Pain over rash: yes, burning, especially pain Feeling ill all over: no Fever: no Mouth sores: no Face or tongue swelling: no Trouble breathing: no Joint swelling or pain: no  No birthcontrol or condom use. Sexually active with boyfriend over last year. Has h/o GC/Chlamydia in past. Wants screening today.    Patient also requesting refill for MetroGel for acne.  Says topical antibiotic helps clear up her acne well on her face and back.  ROS: see HPI for pertinent.  PMFSH: AUB, depression, acne.  Triple history T&A, abdominal laparoscopic.  Family history lupus, depression, anxiety.  Smoking status reviewed. Medications reviewed.  Objective   BP 105/80   Pulse 84   Temp 98.6 F (37 C) (Oral)   Wt 132 lb (59.9 kg)   LMP 12/19/2017   SpO2 100%   BMI 27.59 kg/m  Vitals and nursing note reviewed.  General: well nourished, well developed, NAD with non-toxic appearance HEENT: normocephalic, atraumatic, moist mucous membranes Cardiovascular: regular rate and rhythm without murmurs, rubs, or gallops Lungs: clear to auscultation bilaterally with normal work of breathing Abdomen: soft, non-tender, non-distended, normoactive bowel sounds GU: accompanied by chaperone, erythematous rash around perineal region without purulence or fluctuance and not exposed to mucous membranes, no odor, bleeding, or vaginal discharge, cervical loss  appreciated without friability, no adnexal tenderness Skin: warm, dry, cap refill < 2 seconds, diffuse acne without cysts across face and back Extremities: warm and well perfused, normal tone, no edema  Assessment & Plan   Screen for STD (sexually transmitted disease) Has history of gonorrhea and chlamydia.  No known STD exposure as of recent.  Does not use contraception or protection.  No high risk sexual behavior.  Wet prep negative.  Pregnancy negative. - Checking HIV antibody, RPR, gonorrhea/chlamydia - Discussed safe sex practice  Irritant contact dermatitis Acute.  Localized to perineal region.  Likely related to feminine body wash.  No signs of secondary bacterial infection or mucous membrane exposure. - Given hydrocortisone ointment with instructions to use twice daily for 3 days and avoid applying to mucus membranes - Instructed to avoid irritants  Acne vulgaris Chronic.  Controlled with use of MetroGel.  No cystic acne. - Given refill for MetroGel 1% with instructions to apply to face  Orders Placed This Encounter  Procedures  . HIV antibody  . RPR  . POCT Wet Prep [employer]([place]et [place])  . POCT urine pregnancy   Meds ordered this encounter  Medications  . metroNIDAZOLE (METROGEL) 1 % gel    Sig: Apply topically daily. Apply after cleansing face.  Makeup may be used after applying product.    Dispense:  60 g    Refill:  11  . hydrocortisone 1 % ointment    Sig: Apply 1 application topically 2 (two) times daily for 3 days.    Dispense:  25 g    Refill:  0    Durward Parcelavid McMullen,  DO [facility], PGY-2 01/19/2018, 11:03 AM

## 2018-01-19 NOTE — Assessment & Plan Note (Signed)
Acute.  Localized to perineal region.  Likely related to feminine body wash.  No signs of secondary bacterial infection or mucous membrane exposure. - Given hydrocortisone ointment with instructions to use twice daily for 3 days and avoid applying to mucus membranes - Instructed to avoid irritants

## 2018-01-19 NOTE — Patient Instructions (Signed)
Thank you for coming in to see Lori Weaver today. Please see below to review our plan for today's visit.  1.  Avoid using the body wash that has caused the irritation.  I have given you a medication called hydrocortisone which you can apply to the affected area twice daily for 3 days.  Do not exceed the number of days I have prescribed and do not apply to mucous membranes. 2.  We will call you if there is any abnormalities in her labs, otherwise expect to receive results in the mail.  Be sure to use condoms with intercourse to prevent unwanted pregnancy and STDs. 3.  I called in a refill for your MetroGel.  Use as prescribed.  Please call the clinic at [phone] if your symptoms worsen or you have any concerns. It was our pleasure to serve you.  Durward Parcelavid Daiya Tamer, DO [facility], PGY-2

## 2018-01-19 NOTE — Assessment & Plan Note (Signed)
Chronic.  Controlled with use of MetroGel.  No cystic acne. - Given refill for MetroGel 1% with instructions to apply to face

## 2018-01-19 NOTE — Assessment & Plan Note (Signed)
Has history of gonorrhea and chlamydia.  No known STD exposure as of recent.  Does not use contraception or protection.  No high risk sexual behavior.  Wet prep negative.  Pregnancy negative. - Checking HIV antibody, RPR, gonorrhea/chlamydia - Discussed safe sex practice

## 2018-01-20 ENCOUNTER — Telehealth: Payer: Self-pay | Admitting: *Deleted

## 2018-01-20 ENCOUNTER — Encounter: Payer: Self-pay | Admitting: Family Medicine

## 2018-01-20 LAB — CERVICOVAGINAL ANCILLARY ONLY
CHLAMYDIA, DNA PROBE: NEGATIVE
NEISSERIA GONORRHEA: NEGATIVE

## 2018-01-20 LAB — RPR: RPR Ser Ql: NONREACTIVE

## 2018-01-20 LAB — HIV ANTIBODY (ROUTINE TESTING W REFLEX): HIV Screen 4th Generation wRfx: NONREACTIVE

## 2018-01-20 NOTE — Telephone Encounter (Signed)
Received fax from Pharmacy that metronidazole would need PA.  Per medicaid Preference list, if they run as metrogel it should be covered.    Contacted pharmacy and they ran as metrogel which was covered. It is not in stock but should be available on Friday.  They will let patient know. Sianna Garofano, Maryjo RochesterJessica Dawn, CMA

## 2018-01-23 ENCOUNTER — Emergency Department (HOSPITAL_COMMUNITY)
Admission: EM | Admit: 2018-01-23 | Discharge: 2018-01-23 | Disposition: A | Discharge status: Home / Self Care | Payer: BLUE CROSS/BLUE SHIELD | Attending: Emergency Medicine | Admitting: Emergency Medicine

## 2018-01-23 ENCOUNTER — Other Ambulatory Visit: Payer: Self-pay

## 2018-01-23 ENCOUNTER — Encounter (HOSPITAL_COMMUNITY): Payer: Self-pay | Admitting: Emergency Medicine

## 2018-01-23 DIAGNOSIS — R05 Cough: Secondary | ICD-10-CM | POA: Diagnosis present

## 2018-01-23 DIAGNOSIS — Z79899 Other long term (current) drug therapy: Secondary | ICD-10-CM | POA: Insufficient documentation

## 2018-01-23 DIAGNOSIS — J069 Acute upper respiratory infection, unspecified: Secondary | ICD-10-CM | POA: Insufficient documentation

## 2018-01-23 LAB — URINALYSIS, ROUTINE W REFLEX MICROSCOPIC
BILIRUBIN URINE: NEGATIVE
Glucose, UA: NEGATIVE mg/dL
Hgb urine dipstick: NEGATIVE
KETONES UR: 5 mg/dL — AB
Nitrite: NEGATIVE
Protein, ur: NEGATIVE mg/dL
SPECIFIC GRAVITY, URINE: 1.019 (ref 1.005–1.030)
pH: 6 (ref 5.0–8.0)

## 2018-01-23 MED ORDER — HYDROXYZINE HCL 25 MG PO TABS: 25.0000 mg | ORAL_TABLET | Freq: Three times a day (TID) | ORAL | 0 refills | Status: DC | PRN

## 2018-01-23 NOTE — ED Notes (Signed)
Patient now states she is having urinary discomfort and lower abdominal pain.  Pt is sexually active.  Will upgrade acuity, and patient agreed to be moved back to lobby for appropriate rooming

## 2018-01-23 NOTE — ED Triage Notes (Signed)
Pt states she has had throat itching, drainage, and sinus pressure for 3 days.

## 2018-01-23 NOTE — ED Notes (Signed)
Family at bedside. 

## 2018-01-23 NOTE — ED Notes (Signed)
PT states understanding of care given, follow up care, and medication prescribed. PT ambulated from ED to car with a steady gait. 

## 2018-01-23 NOTE — ED Provider Notes (Addendum)
[facility]   CSN: [id number] Arrival date & time: 01/23/18  2109     History   Chief Complaint Chief Complaint  Patient presents with  . URI    HPI Lori Weaver is a 22 y.o. female.  HPI  Patient presents today with her boyfriend. She states she has been having cough, itchy throat, and rhinorrhea. Has been trying OTC cold and flu products. Denies fever. States she is bothered by the cough and thus wanted to come be seen. Also wanted to "make sure my UTI isn't back." was given keflex recently here. Boyfriend is without symptoms. Denies abdominal pain or dysuria. States she feels some abdominal fullness when she is about to start her period, thinks she may be starting this sensation today.   Past Medical History:  Diagnosis Date  . Allergy   . Anxiety   . Depression   . Dysmenorrhea 04/2017  . Dysmenorrhea     Patient Active Problem List   Diagnosis Date Noted  . Irritant contact dermatitis 01/19/2018  . Screen for STD (sexually transmitted disease) 01/19/2018  . Encounter to establish care 12/02/2017  . Family history of lupus erythematosus 12/02/2017  . Depression 12/02/2017  . Acne vulgaris 12/02/2017  . Dysmenorrhea 05/06/2017    Past Surgical History:  Procedure Laterality Date  . LAPAROSCOPY N/A 05/06/2017   Procedure: LAPAROSCOPY DIAGNOSTIC;  Surgeon: Tereso NewcomerAnyanwu, Ugonna A, MD;  Location: [facility];  Service: Gynecology;  Laterality: N/A;  . LAPAROSCOPY ABDOMEN DIAGNOSTIC    . TONSILLECTOMY AND ADENOIDECTOMY    . TYMPANOSTOMY TUBE PLACEMENT Bilateral      OB History   None      Home Medications    Prior to Admission medications   Medication Sig Start Date End Date Taking? Authorizing Provider  buPROPion (WELLBUTRIN XL) 150 MG 24 hr tablet Take 150 mg by mouth daily.    Yes [provider]  hydrOXYzine (ATARAX/VISTARIL) 25 MG tablet Take 25 mg by mouth at bedtime.   Yes [provider]  ibuprofen (ADVIL,MOTRIN) 800 MG tablet Take 1 tablet (800 mg total) by mouth every 8 (eight) hours as needed. Patient taking differently: Take 800 mg by mouth every 8 (eight) hours as needed for moderate pain.  12/02/17  Yes Winfrey, Harlen LabsAmanda C, MD  metroNIDAZOLE (METROGEL) 1 % gel Apply topically daily. Apply after cleansing face.  Makeup may be used after applying product. Patient taking differently: Apply 1 application topically at bedtime. Apply after cleansing face.  Makeup may be used after applying product. 01/19/18  Yes Wendee BeaversMcMullen, David J, DO  naproxen (NAPROSYN) 500 MG tablet Take 1 tablet (500 mg total) by mouth 2 (two) times daily with a meal. As needed for cramps. Take with meals. Patient not taking: Reported on 01/23/2018 05/06/17   Anyanwu, Jethro BastosUgonna A, MD    Family History Family History  Problem Relation Age of Onset  . Lupus Mother   . Depression Mother   . Anxiety disorder Mother   . Lupus Maternal Grandmother     Social History Social History   Tobacco Use  . Smoking status: Never Smoker  . Smokeless tobacco: Never Used  Substance Use Topics  . Alcohol use: No  . Drug use: Not Currently    Types: Marijuana     Allergies   Fish allergy and Soap   Review of Systems Review of Systems  Constitutional: Negative for appetite change and fever.  HENT: Positive for congestion,  rhinorrhea and sore throat. Negative for sinus pressure.   Respiratory: Positive for cough. Negative for chest tightness and shortness of breath.   Cardiovascular: Negative for chest pain.  Genitourinary: Negative for dysuria.  Musculoskeletal: Negative for arthralgias.  Skin: Negative for rash.     Physical Exam Updated Vital Signs BP 131/67 (BP Location: Right Arm)   Pulse (!) 104   Temp 99.3 F (37.4 C) (Oral)   Resp 20   SpO2 99%   Physical Exam  Constitutional: She is oriented to person, place, and time. She appears well-developed and well-nourished. No distress.    HENT:  Head: Normocephalic and atraumatic.  Right Ear: External ear normal.  Left Ear: External ear normal.  Mouth/Throat: Oropharynx is clear and moist.  Eyes: Conjunctivae and EOM are normal.  Neck: Normal range of motion. Neck supple.  Cardiovascular: Normal rate, regular rhythm and normal heart sounds.  No murmur heard. Pulmonary/Chest: Effort normal and breath sounds normal.  Abdominal: Soft. Bowel sounds are normal. There is no tenderness.  Musculoskeletal: Normal range of motion.  Neurological: She is alert and oriented to person, place, and time.  Skin: Capillary refill takes less than 2 seconds.  Psychiatric: She has a normal mood and affect.     ED Treatments / Results  Labs (all labs ordered are listed, but only abnormal results are displayed) Labs Reviewed  URINALYSIS, ROUTINE W REFLEX MICROSCOPIC - Abnormal; Notable for the following components:      Result Value   APPearance HAZY (*)    Ketones, ur 5 (*)    Leukocytes, UA SMALL (*)    Bacteria, UA RARE (*)    All other components within normal limits    EKG None  Radiology No results found.  Procedures Procedures (including critical care time)  Medications Ordered in ED Medications - No data to display   Initial Impression / Assessment and Plan / ED Course  I have reviewed the triage vital signs and the nursing notes.  Pertinent labs & imaging results that were available during my care of the patient were reviewed by me and considered in my medical decision making (see chart for details).     Patient with no sinus tenderness, no fever, no abdominal tenderness. UA without signs of overt infection and with recent similar without culture proven infection.  Safe to discharge home with supportive care, recommended honey for cough and sore throat.   On reassessment, patient states she has had trouble sleeping with this cold, will send rx for vistaril.   Final Clinical Impressions(s) / ED Diagnoses    Final diagnoses:  Viral upper respiratory tract infection    ED Discharge Orders    None     Loni Muse, MD PGY 3 FM   Garth Bigness, MD 01/23/18 [id number]    Garth Bigness, MD 01/23/18 1191    Blane Ohara, MD 01/24/18 0045

## 2018-01-23 NOTE — Discharge Instructions (Signed)
Try a teaspoon of honey for your cough and sore throat, you can use this multiple times per day. Your urine looks ok. Return if you are short of breath or have worsening symptoms or are worried.

## 2018-01-25 ENCOUNTER — Encounter: Payer: Self-pay | Admitting: Family Medicine

## 2018-01-25 DIAGNOSIS — Z111 Encounter for screening for respiratory tuberculosis: Secondary | ICD-10-CM | POA: Diagnosis not present

## 2018-02-05 ENCOUNTER — Other Ambulatory Visit: Payer: Self-pay | Admitting: Family Medicine

## 2018-02-05 ENCOUNTER — Telehealth: Payer: Self-pay | Admitting: *Deleted

## 2018-02-05 MED ORDER — CLINDAMYCIN PHOS-BENZOYL PEROX 1-5 % EX GEL: Freq: Two times a day (BID) | CUTANEOUS | 0 refills | Status: DC

## 2018-02-05 NOTE — Telephone Encounter (Signed)
[facility] [address] For clindamyc/benz/per 1/5% gel pmp to apply to affected area twice daily.  Did not see on current med list. Routing to PCP. Lamonte SakaiZimmerman Rumple, Vihan Santagata D, [place]

## 2018-02-24 DIAGNOSIS — F411 Generalized anxiety disorder: Secondary | ICD-10-CM | POA: Diagnosis not present

## 2018-02-24 DIAGNOSIS — F321 Major depressive disorder, single episode, moderate: Secondary | ICD-10-CM | POA: Diagnosis not present

## 2018-03-24 DIAGNOSIS — F321 Major depressive disorder, single episode, moderate: Secondary | ICD-10-CM | POA: Diagnosis not present

## 2018-04-19 DIAGNOSIS — Z113 Encounter for screening for infections with a predominantly sexual mode of transmission: Secondary | ICD-10-CM | POA: Diagnosis not present

## 2018-04-20 ENCOUNTER — Other Ambulatory Visit: Payer: Self-pay

## 2018-04-20 ENCOUNTER — Encounter (HOSPITAL_COMMUNITY): Payer: Self-pay | Admitting: Emergency Medicine

## 2018-04-20 ENCOUNTER — Emergency Department (HOSPITAL_COMMUNITY)
Admission: EM | Admit: 2018-04-20 | Discharge: 2018-04-21 | Disposition: A | Discharge status: Home / Self Care | Payer: Medicaid Other | Attending: Emergency Medicine | Admitting: Emergency Medicine

## 2018-04-20 DIAGNOSIS — J069 Acute upper respiratory infection, unspecified: Secondary | ICD-10-CM | POA: Insufficient documentation

## 2018-04-20 DIAGNOSIS — J029 Acute pharyngitis, unspecified: Secondary | ICD-10-CM | POA: Diagnosis not present

## 2018-04-20 DIAGNOSIS — Z79899 Other long term (current) drug therapy: Secondary | ICD-10-CM | POA: Diagnosis not present

## 2018-04-20 LAB — GROUP A STREP BY PCR: Group A Strep by PCR: NOT DETECTED

## 2018-04-20 MED ORDER — DEXAMETHASONE 4 MG PO TABS
10.0000 mg | ORAL_TABLET | Freq: Once | ORAL | Status: AC
  Administered 2018-04-21: 10 mg via ORAL
  Filled 2018-04-20: qty 3

## 2018-04-20 NOTE — ED Triage Notes (Signed)
Pt reports sore throat and SOB X2 days. SOB comes and goes, "my ears are congested and my throat is swollen."  Able to speak in complete sentences.  Pt used OTC "but the symptoms just came back"

## 2018-04-20 NOTE — ED Provider Notes (Signed)
[facility] EMERGENCY DEPARTMENT Provider Note   CSN: [id number] Arrival date & time: 04/20/18  2125     History   Chief Complaint Chief Complaint  Patient presents with  . Shortness of Breath  . Sore Throat    HPI Lori Weaver is a 22 y.o. female.  Patient presents for evaluation of symptoms of sore throat, feeling throat is tight and swallowing is difficult, nasal congestion, nonproductive cough. Symptoms started 3 days ago. No fever at any point. No nausea, vomiting. She is in college and feels others have had similar symptoms.   The history is provided by the patient. No language interpreter was used.  Shortness of Breath  Associated symptoms include sore throat and cough. Pertinent negatives include no fever, no vomiting and no rash.  Sore Throat  Associated symptoms include shortness of breath.    Past Medical History:  Diagnosis Date  . Allergy   . Anxiety   . Depression   . Dysmenorrhea 04/2017  . Dysmenorrhea     Patient Active Problem List   Diagnosis Date Noted  . Irritant contact dermatitis 01/19/2018  . Screen for STD (sexually transmitted disease) 01/19/2018  . Encounter to establish care 12/02/2017  . Family history of lupus erythematosus 12/02/2017  . Depression 12/02/2017  . Acne vulgaris 12/02/2017  . Dysmenorrhea 05/06/2017    Past Surgical History:  Procedure Laterality Date  . LAPAROSCOPY N/A 05/06/2017   Procedure: LAPAROSCOPY DIAGNOSTIC;  Surgeon: Tereso Newcomer, MD;  Location: [facility];  Service: Gynecology;  Laterality: N/A;  . LAPAROSCOPY ABDOMEN DIAGNOSTIC    . TONSILLECTOMY AND ADENOIDECTOMY    . TYMPANOSTOMY TUBE PLACEMENT Bilateral      OB History   None      Home Medications    Prior to Admission medications   Medication Sig Start Date End Date Taking? Authorizing Provider  buPROPion (WELLBUTRIN XL) 150 MG 24 hr tablet Take 150 mg by mouth daily.     [provider]    clindamycin-benzoyl peroxide (BENZACLIN) gel Apply topically 2 (two) times daily. 02/05/18   Lennox Solders, MD  hydrOXYzine (ATARAX/VISTARIL) 25 MG tablet Take 1 tablet (25 mg total) by mouth 3 (three) times daily as needed. 01/23/18   Garth Bigness, MD  ibuprofen (ADVIL,MOTRIN) 800 MG tablet Take 1 tablet (800 mg total) by mouth every 8 (eight) hours as needed. Patient taking differently: Take 800 mg by mouth every 8 (eight) hours as needed for moderate pain.  12/02/17   Lennox Solders, MD  metroNIDAZOLE (METROGEL) 1 % gel Apply topically daily. Apply after cleansing face.  Makeup may be used after applying product. Patient taking differently: Apply 1 application topically at bedtime. Apply after cleansing face.  Makeup may be used after applying product. 01/19/18   Wendee Beavers, DO  naproxen (NAPROSYN) 500 MG tablet Take 1 tablet (500 mg total) by mouth 2 (two) times daily with a meal. As needed for cramps. Take with meals. Patient not taking: Reported on 01/23/2018 05/06/17   Anyanwu, Jethro Bastos, MD    Family History Family History  Problem Relation Age of Onset  . Lupus Mother   . Depression Mother   . Anxiety disorder Mother   . Lupus Maternal Grandmother     Social History Social History   Tobacco Use  . Smoking status: Never Smoker  . Smokeless tobacco: Never Used  Substance Use Topics  . Alcohol use: No  . Drug use: Not Currently  Types: Marijuana     Allergies   Fish allergy and Soap   Review of Systems Review of Systems  Constitutional: Negative for chills and fever.  HENT: Positive for congestion, sinus pressure, sore throat and trouble swallowing.   Respiratory: Positive for cough and shortness of breath.   Cardiovascular: Negative.   Gastrointestinal: Negative.  Negative for nausea and vomiting.  Musculoskeletal: Negative.   Skin: Negative.  Negative for rash.  Neurological: Negative.      Physical Exam Updated Vital Signs BP (!) 117/91    Pulse 90   Temp 98.7 F (37.1 C)   Resp 18   Ht 4\' 10"  (1.473 m)   Wt 56.2 kg   SpO2 100%   BMI 25.92 kg/m   Physical Exam  Constitutional: She appears well-developed and well-nourished. She does not appear ill. No distress.  HENT:  Head: Normocephalic.  Right Ear: Tympanic membrane normal.  Left Ear: Tympanic membrane normal.  Nose: Mucosal edema present. Right sinus exhibits no maxillary sinus tenderness and no frontal sinus tenderness. Left sinus exhibits no maxillary sinus tenderness and no frontal sinus tenderness.  Mouth/Throat: Uvula is midline, oropharynx is clear and moist and mucous membranes are normal. No tonsillar exudate.  Neck: Normal range of motion. Neck supple.  Cardiovascular: Normal rate and regular rhythm.  Pulmonary/Chest: Effort normal and breath sounds normal. No stridor. She has no wheezes. She has no rales.  Abdominal: Soft. Bowel sounds are normal. There is no tenderness. There is no rebound and no guarding.  Musculoskeletal: Normal range of motion.  Lymphadenopathy:    She has no cervical adenopathy.  Neurological: She is alert. No cranial nerve deficit.  Skin: Skin is warm and dry. No rash noted.  Psychiatric: She has a normal mood and affect.     ED Treatments / Results  Labs (all labs ordered are listed, but only abnormal results are displayed) Labs Reviewed  GROUP A STREP BY PCR    EKG None  Radiology No results found.  Procedures Procedures (including critical care time)  Medications Ordered in ED Medications - No data to display   Initial Impression / Assessment and Plan / ED Course  I have reviewed the triage vital signs and the nursing notes.  Pertinent labs & imaging results that were available during my care of the patient were reviewed by me and considered in my medical decision making (see chart for details).     Patient presents with symptoms of URI x 3 days without fever. Strep negative.   She appears well hydrated,  in NAD, VSS. Will provide decadron for address/improve edema. Suggested other treatments for symptom relief.  Final Clinical Impressions(s) / ED Diagnoses   Final diagnoses:  None   1. URI 2. pharyngitis  ED Discharge Orders    None       Elpidio Anis, PA-C 04/20/18 2359    Gilda Crease, MD 04/21/18 [phone]

## 2018-04-21 DIAGNOSIS — J069 Acute upper respiratory infection, unspecified: Secondary | ICD-10-CM | POA: Diagnosis not present

## 2018-04-21 NOTE — ED Notes (Signed)
Patient verbalizes understanding of discharge instructions. Opportunity for questioning and answers were provided. Armband removed by staff, pt discharged from ED.  

## 2018-04-28 ENCOUNTER — Ambulatory Visit (INDEPENDENT_AMBULATORY_CARE_PROVIDER_SITE_OTHER): Payer: BLUE CROSS/BLUE SHIELD | Admitting: Family Medicine

## 2018-04-28 ENCOUNTER — Encounter: Payer: Self-pay | Admitting: Family Medicine

## 2018-04-28 VITALS — BP 110/70 | HR 112 | Temp 98.6°F | Wt 121.0 lb

## 2018-04-28 DIAGNOSIS — R05 Cough: Secondary | ICD-10-CM | POA: Diagnosis not present

## 2018-04-28 DIAGNOSIS — F321 Major depressive disorder, single episode, moderate: Secondary | ICD-10-CM

## 2018-04-28 DIAGNOSIS — R059 Cough, unspecified: Secondary | ICD-10-CM | POA: Insufficient documentation

## 2018-04-28 MED ORDER — BENZONATATE 100 MG PO CAPS: 100.0000 mg | ORAL_CAPSULE | Freq: Two times a day (BID) | ORAL | 0 refills | Status: DC | PRN

## 2018-04-28 MED ORDER — DM-GUAIFENESIN ER 30-600 MG PO TB12: 1.0000 | ORAL_TABLET | Freq: Two times a day (BID) | ORAL | 0 refills | Status: DC | PRN

## 2018-04-28 NOTE — Progress Notes (Signed)
SUBJECTIVE:  PCP: Lennox Solders, MD Patient ID: MRN [id number]  Date of birth: Oct 03, 1995  HPI Lori Weaver is a 21 y.o. female who presents to clinic with chief complaint of cough. Other complaints today include need for emotional support dog.   #Cough Quality: Dry cough, and at times with mucous.  Patient had a sore throat after she had increased coughing.  Quantity: Has been experiencing symptoms for 1 week.  Timing: Cough is not worse at any part of the day.  Though it is keeping her from sleep. Modifying Factors: Has tried NyQuil at night with little relief. Associated: Patient denies fevers, chills, headache, abdominal pain, nausea vomiting.   #Emotional support dog  Type of dog is not allowed in the neighborhood. Attempted to get note from school psychiatrist, but they are not able to write them for students any more. Patient does not want to be on anti-dep medication. Dog makes patient feel better and be more active during the day. Patient has tried fluoxetine, well-butrin x 1 year and 300 x and then had side effects s/a losing weight. Now back on 150mg . Patient lives in [place] and has a weight restriction for dogs, but dog is below. Dog is American Bulldog.   Review of Symptoms: See HPI  HISTORY Medications & Allergies: Reviewed with patient and updated in EMR as appropriate.   Past Medical & Surgical History:  Patient Active Problem List   Diagnosis Date Noted  . Irritant contact dermatitis 01/19/2018  . Screen for STD (sexually transmitted disease) 01/19/2018  . Encounter to establish care 12/02/2017  . Family history of lupus erythematosus 12/02/2017  . Depression 12/02/2017  . Acne vulgaris 12/02/2017  . Dysmenorrhea 05/06/2017   Past Surgical History:  Procedure Laterality Date  . LAPAROSCOPY N/A 05/06/2017   Procedure: LAPAROSCOPY DIAGNOSTIC;  Surgeon: Tereso Newcomer, MD;  Location: [facility];  Service: Gynecology;  Laterality: N/A;  .  LAPAROSCOPY ABDOMEN DIAGNOSTIC    . TONSILLECTOMY AND ADENOIDECTOMY    . TYMPANOSTOMY TUBE PLACEMENT Bilateral    Family History:  family history includes Anxiety disorder in her mother; Depression in her mother; Lupus in her maternal grandmother and mother.  Social History:   reports that she has never smoked. She has never used smokeless tobacco. She reports that she has current or past drug history. Drug: Marijuana. She reports that she does not drink alcohol.  OBJECTIVE:  BP 110/70   Pulse (!) 112   Temp 98.6 F (37 C) (Oral)   Wt 121 lb (54.9 kg)   SpO2 100%   BMI 25.29 kg/m   Physical Exam:  Gen: NAD, alert, non-toxic, well-appearing, sitting comfortably  Skin: Warm and dry. No obvious rashes, lesions, or trauma. HEENT: NCAT No conjunctival pallor or injection. No scleral icterus or injection.  MMM.  Resp:  No increased WOB Psych: Cooperative with exam. Pleasant. Makes eye contact. Speech normal. Extremities: Moves all extremities spontaneously  Neuro: CN II-XII grossly intact. No FNDs.   ASSESSMENT & PLAN:  Depression Patient's depression has been improved with her dog.  She has tried fluoxetine in the past as well as Wellbutrin.  She was titrated up to 300 mg of Wellbutrin but had to titrate down due to nearly 10 pound weight loss.  Her psychiatrist manages these medications.  Today she asks for a letter for her apartment complex.  She is afraid that they are going to tell her that she cannot have the dog there, even though the  dog does meet weight limits laid out in the lease.  Will write letter  Cough Patient reports that she has had cough for 1 week.  She was seen in the ED on October 1 and diagnosed with a pharyngitis and discharged after Solu-Medrol.  She denies fevers, chills, abdominal pain, headaches.  Likely upper respiratory infection that is viral in origin as patient has not acutely worsened.  Patient denies wheezing and history of asthma.  Tessalon Perles  and Mucinex DM to target symptoms  Return precautions if symptoms do not improve   Patient would like to postpone tetanus and flu shot for December   Genia Hotter, M.D. [facility]  PGY -1 04/28/2018, 1:58 PM

## 2018-04-28 NOTE — Patient Instructions (Signed)
Dear Jaquita Rector,   It was nice to see you today! I am glad you came in for your concerns. This document serves as a "wrap-up" to all that we discussed today and is listed as follows:    Cough  Sending Tessalon Perles and Mucinex DM to the pharmacy to help target symptoms  Please discontinue the NyQuil if possible, as it is not helping any of your symptoms  Depression  I am glad that your depression is improving.  I have attached a letter for your apartment complex  There is also a letter to excuse you from school today.  Your To Do List:  . December appointment for Flu shot and Tetanus/Tdap  Labs & Orders For labs done today, I will reach out to you with results via letter or phone call. No labs were necessary today.   Follow up Please request a follow up appointment if you experience any worsening symptoms. If your symptoms are life threatening, please go immediately to the nearest Emergency Department.   Health Maintenance At your next appointment, you will be due for flu shot and tdap.    Thank you for choosing [facility] for your primary care needs and stay well!   Best,   Dr. Genia Hotter Resident Physician, PGY-1 [facility] [phone]    Don't forget to sign up for MyChart for instant access to your health profile, labs, orders, upcoming appointments or to contact your provider with questions. Stop at the front desk on the way out for more information about how to sign up!

## 2018-04-28 NOTE — Assessment & Plan Note (Signed)
Patient reports that she has had cough for 1 week.  She was seen in the ED on October 1 and diagnosed with a pharyngitis and discharged after Solu-Medrol.  She denies fevers, chills, abdominal pain, headaches.  Likely upper respiratory infection that is viral in origin as patient has not acutely worsened.  Patient denies wheezing and history of asthma.  Tessalon Perles and Mucinex DM to target symptoms  Return precautions if symptoms do not improve

## 2018-04-28 NOTE — Assessment & Plan Note (Signed)
Patient has been coughing x1 week.  No fever, no chills.  Likely viral process.  Patient does not have any history of asthma and denies wheezing.  Send Tessalon Perles to help with coughing at night  Mucinex DM to help relieve chest congestion and loosen mucus

## 2018-04-28 NOTE — Assessment & Plan Note (Signed)
Patient's depression has been improved with her dog.  She has tried fluoxetine in the past as well as Wellbutrin.  She was titrated up to 300 mg of Wellbutrin but had to titrate down due to nearly 10 pound weight loss.  Her psychiatrist manages these medications.  Today she asks for a letter for her apartment complex.  She is afraid that they are going to tell her that she cannot have the dog there, even though the dog does meet weight limits laid out in the lease.  Will write letter

## 2018-04-29 ENCOUNTER — Ambulatory Visit (HOSPITAL_COMMUNITY)
Admission: EM | Admit: 2018-04-29 | Discharge: 2018-04-29 | Disposition: A | Discharge status: Home / Self Care | Payer: BLUE CROSS/BLUE SHIELD | Attending: Family Medicine | Admitting: Family Medicine

## 2018-04-29 ENCOUNTER — Encounter (HOSPITAL_COMMUNITY): Payer: Self-pay | Admitting: Family Medicine

## 2018-04-29 DIAGNOSIS — M791 Myalgia, unspecified site: Secondary | ICD-10-CM

## 2018-04-29 DIAGNOSIS — B2790 Infectious mononucleosis, unspecified without complication: Secondary | ICD-10-CM | POA: Diagnosis not present

## 2018-04-29 DIAGNOSIS — J029 Acute pharyngitis, unspecified: Secondary | ICD-10-CM

## 2018-04-29 MED ORDER — ACETAMINOPHEN 160 MG/5ML PO SOLN
650.0000 mg | Freq: Once | ORAL | Status: AC
  Administered 2018-04-29: 650 mg via ORAL

## 2018-04-29 MED ORDER — PREDNISONE 20 MG PO TABS: ORAL_TABLET | ORAL | 0 refills | Status: DC

## 2018-04-29 MED ORDER — ACETAMINOPHEN 160 MG/5ML PO SOLN
ORAL | Status: AC
  Filled 2018-04-29: qty 20.3

## 2018-04-29 MED ORDER — CHLORHEXIDINE GLUCONATE 0.12 % MT SOLN: 15.0000 mL | Freq: Two times a day (BID) | OROMUCOSAL | 0 refills | Status: DC

## 2018-04-29 NOTE — ED Provider Notes (Signed)
[facility]    CSN: [id number] Arrival date & time: 04/29/18  1215     History   Chief Complaint Chief Complaint  Patient presents with  . Sore Throat    HPI Lori Weaver is a 22 y.o. female.   Is a 22 year old woman who presents for evaluation of sore throat and myalgia.  She has had the sore throat for 2 weeks off and on and over the last 24 hours developed a fever.  She went to the emergency room yesterday where they did a throat culture and provided her with some Mucinex because she also has a mild cough.  Patient's had no vomiting or diarrhea.  She has no shortness of breath.  Patient was seen yesterday and a negative rapid strep test was performed.  Patient is a Consulting civil engineer at [employer].     Past Medical History:  Diagnosis Date  . Allergy   . Anxiety   . Depression   . Dysmenorrhea 04/2017  . Dysmenorrhea     Patient Active Problem List   Diagnosis Date Noted  . Cough 04/28/2018  . Irritant contact dermatitis 01/19/2018  . Screen for STD (sexually transmitted disease) 01/19/2018  . Encounter to establish care 12/02/2017  . Family history of lupus erythematosus 12/02/2017  . Depression 12/02/2017  . Acne vulgaris 12/02/2017  . Dysmenorrhea 05/06/2017    Past Surgical History:  Procedure Laterality Date  . LAPAROSCOPY N/A 05/06/2017   Procedure: LAPAROSCOPY DIAGNOSTIC;  Surgeon: Tereso Newcomer, MD;  Location: [facility];  Service: Gynecology;  Laterality: N/A;  . LAPAROSCOPY ABDOMEN DIAGNOSTIC    . TONSILLECTOMY AND ADENOIDECTOMY    . TYMPANOSTOMY TUBE PLACEMENT Bilateral     OB History   None      Home Medications    Prior to Admission medications   Medication Sig Start Date End Date Taking? Authorizing Provider  benzonatate (TESSALON) 100 MG capsule Take 1 capsule (100 mg total) by mouth 2 (two) times daily as needed for cough. 04/28/18  Yes Melene Plan, MD    buPROPion (WELLBUTRIN XL) 150 MG 24 hr tablet Take 150 mg by mouth daily.    Yes [provider]  dextromethorphan-guaiFENesin (MUCINEX DM) 30-600 MG 12hr tablet Take 1 tablet by mouth 2 (two) times daily as needed for cough. 04/28/18  Yes Melene Plan, MD  hydrOXYzine (ATARAX/VISTARIL) 25 MG tablet Take 1 tablet (25 mg total) by mouth 3 (three) times daily as needed. 01/23/18  Yes Garth Bigness, MD  chlorhexidine (PERIDEX) 0.12 % solution Use as directed 15 mLs in the mouth or throat 2 (two) times daily. 04/29/18   Elvina Sidle, MD  clindamycin-benzoyl peroxide (BENZACLIN) gel Apply topically 2 (two) times daily. 02/05/18   Lennox Solders, MD  ibuprofen (ADVIL,MOTRIN) 800 MG tablet Take 1 tablet (800 mg total) by mouth every 8 (eight) hours as needed. Patient taking differently: Take 800 mg by mouth every 8 (eight) hours as needed for moderate pain.  12/02/17   Lennox Solders, MD  metroNIDAZOLE (METROGEL) 1 % gel Apply topically daily. Apply after cleansing face.  Makeup may be used after applying product. Patient taking differently: Apply 1 application topically at bedtime. Apply after cleansing face.  Makeup may be used after applying product. 01/19/18   Wendee Beavers, DO  predniSONE (DELTASONE) 20 MG tablet One daily with food 04/29/18   Elvina Sidle, MD    Family History Family History  Problem Relation Age of Onset  . Lupus Mother   . Depression Mother   . Anxiety disorder Mother   . Lupus Maternal Grandmother     Social History Social History   Tobacco Use  . Smoking status: Never Smoker  . Smokeless tobacco: Never Used  Substance Use Topics  . Alcohol use: No  . Drug use: Not Currently    Types: Marijuana     Allergies   Fish allergy and Soap   Review of Systems Review of Systems  Constitutional: Positive for diaphoresis, fatigue and fever.  HENT: Positive for sore throat.   Respiratory: Positive for cough.   Cardiovascular: Negative.    Gastrointestinal: Negative.   Musculoskeletal: Positive for myalgias.  Neurological: Positive for headaches.  All other systems reviewed and are negative.    Physical Exam Triage Vital Signs ED Triage Vitals  Enc Vitals Group     BP      Pulse      Resp      Temp      Temp src      SpO2      Weight      Height      Head Circumference      Peak Flow      Pain Score      Pain Loc      Pain Edu?      Excl. in GC?    No data found.  Updated Vital Signs BP (!) 112/57   Pulse (!) 114   Temp (!) 100.5 F (38.1 C) (Oral)   Resp 16   LMP 04/01/2018   SpO2 100%    Physical Exam  Constitutional: She is oriented to person, place, and time. She appears well-developed and well-nourished.  HENT:  Head: Normocephalic and atraumatic.  Right Ear: Hearing and tympanic membrane normal.  Left Ear: Hearing and tympanic membrane normal.  Mouth/Throat: Uvula is midline. Posterior oropharyngeal erythema present. Tonsils are 0 on the right. Tonsils are 0 on the left.  Eyes: Pupils are equal, round, and reactive to light. EOM are normal.  Neck: Normal range of motion. Neck supple.  Bilateral posterior cervical adenopathy  Cardiovascular: Normal rate and normal heart sounds.  Pulmonary/Chest: Effort normal and breath sounds normal.  Abdominal: Soft. Bowel sounds are normal. There is no tenderness.  Patient has palpable spleen and liver  Lymphadenopathy:    She has cervical adenopathy.  Neurological: She is alert and oriented to person, place, and time.  Skin: Skin is warm and dry.  Psychiatric:  Patient cries when history is reviewed.  Nursing note and vitals reviewed.    UC Treatments / Results  Labs (all labs ordered are listed, but only abnormal results are displayed) Labs Reviewed  CBC WITH DIFFERENTIAL/PLATELET    EKG None  Radiology No results found.  Procedures Procedures (including critical care time)  Medications Ordered in UC Medications  acetaminophen  (TYLENOL) solution 650 mg (650 mg Oral Given 04/29/18 1413)    Initial Impression / Assessment and Plan / UC Course  I have reviewed the triage vital signs and the nursing notes.  Pertinent labs & imaging results that were available during my care of the patient were reviewed by me and considered in my medical decision making (see chart for details).     Final Clinical Impressions(s) / UC Diagnoses   Final diagnoses:  Infectious mononucleosis without complication, infectious mononucleosis due to unspecified organism   Discharge Instructions   None  ED Prescriptions    Medication Sig Dispense Auth. Provider   predniSONE (DELTASONE) 20 MG tablet One daily with food 7 tablet Elvina Sidle, MD   chlorhexidine (PERIDEX) 0.12 % solution Use as directed 15 mLs in the mouth or throat 2 (two) times daily. 120 mL Elvina Sidle, MD     Controlled Substance Prescriptions [place] Controlled Substance Registry consulted? Not Applicable   Elvina Sidle, MD 04/29/18 1425

## 2018-04-29 NOTE — ED Triage Notes (Signed)
PT reports sore throat, fever, body aches for 2 days.

## 2018-04-30 LAB — POCT INFECTIOUS MONO SCREEN: Mono Screen: POSITIVE — AB

## 2018-05-04 DIAGNOSIS — F321 Major depressive disorder, single episode, moderate: Secondary | ICD-10-CM | POA: Diagnosis not present

## 2018-05-04 DIAGNOSIS — F411 Generalized anxiety disorder: Secondary | ICD-10-CM | POA: Diagnosis not present

## 2018-05-07 DIAGNOSIS — J029 Acute pharyngitis, unspecified: Secondary | ICD-10-CM | POA: Diagnosis not present

## 2018-05-07 DIAGNOSIS — Z113 Encounter for screening for infections with a predominantly sexual mode of transmission: Secondary | ICD-10-CM | POA: Diagnosis not present

## 2018-05-21 ENCOUNTER — Ambulatory Visit (INDEPENDENT_AMBULATORY_CARE_PROVIDER_SITE_OTHER): Payer: BLUE CROSS/BLUE SHIELD | Admitting: Student in an Organized Health Care Education/Training Program

## 2018-05-21 ENCOUNTER — Other Ambulatory Visit (HOSPITAL_COMMUNITY)
Admission: RE | Admit: 2018-05-21 | Discharge: 2018-05-21 | Disposition: A | Discharge status: Home / Self Care | Payer: BLUE CROSS/BLUE SHIELD | Source: Ambulatory Visit | Attending: Family Medicine | Admitting: Family Medicine

## 2018-05-21 ENCOUNTER — Other Ambulatory Visit: Payer: Self-pay

## 2018-05-21 VITALS — BP 104/60 | HR 79 | Temp 97.7°F | Ht <= 58 in | Wt 121.8 lb

## 2018-05-21 DIAGNOSIS — Z7251 High risk heterosexual behavior: Secondary | ICD-10-CM | POA: Insufficient documentation

## 2018-05-21 LAB — POCT WET PREP (WET MOUNT)
Clue Cells Wet Prep Whiff POC: NEGATIVE
Trichomonas Wet Prep HPF POC: ABSENT

## 2018-05-21 LAB — POCT URINE PREGNANCY: Preg Test, Ur: NEGATIVE

## 2018-05-21 NOTE — Progress Notes (Signed)
   CC: STD screen  HPI: Lori Weaver is a 22 y.o. female   Patient presents today for routine STI check.  She reports that she is sexually active with one female partner.  She is not using contraception.  She is not using condoms or any other birth control.  She has not had any dysuria, hematuria, urinary frequency or urgency.  She has not had any painful intercourse.  She has not had any discharge.  She has not had any vaginal pruritus.  Is not interested in contraceptive options at this time.  She has not been exposed to any STD as far as she is aware.   Review of Symptoms - see HPI PMH - Smoking status noted.    Review of Symptoms:  See HPI for ROS.   CC, SH/smoking status, and VS noted.  Objective: Ht 4\' 10"  (1.473 m)   Wt 121 lb 12.8 oz (55.2 kg)   LMP 05/02/2018 (Exact Date)   BMI 25.46 kg/m  GEN: NAD, alert, cooperative, and pleasant. RESPIRATORY: Comfortable work of breathing, speaks in full sentences CV: Regular rate noted, distal extremities well perfused and warm without edema GI: Soft, nondistended SKIN: warm and dry, no rashes or lesions NEURO: II-XII grossly intact MSK: Moves 4 extremities equally PSYCH: AAOx3, appropriate affect Female genitalia: normal external genitalia, vulva, vagina, cervix, uterus and adnexa, no discharge  Assessment and plan:  1. STD check - discussed safe sex practices and briefly went over contraceptive options. Patient is not interested at this time. She is not trying to become pregnant. - GC, chlamydia, wet prep for trichomonas - HIV/RPR - urine pregnancy negative - will follow up with results - refuses contraceptive options  Howard Pouch, MD,MS,  PGY3 05/21/2018 3:05 PM

## 2018-05-21 NOTE — Patient Instructions (Signed)
It was a pleasure seeing you today in our clinic. Here is the treatment plan we have discussed and agreed upon together:  We drew blood work at today's visit. I will call or send you a letter with these results. If you do not hear from me within the next week, please give our office a call.  Our clinic's number is [phone]. Please call with questions or concerns about what we discussed today.  Be well, Dr. Corryn Madewell   

## 2018-05-22 LAB — HIV ANTIBODY (ROUTINE TESTING W REFLEX): HIV Screen 4th Generation wRfx: NONREACTIVE

## 2018-05-22 LAB — RPR: RPR Ser Ql: NONREACTIVE

## 2018-05-24 LAB — CERVICOVAGINAL ANCILLARY ONLY
CHLAMYDIA, DNA PROBE: NEGATIVE
Neisseria Gonorrhea: NEGATIVE

## 2018-05-28 ENCOUNTER — Encounter: Payer: Self-pay | Admitting: Student in an Organized Health Care Education/Training Program

## 2018-05-31 DIAGNOSIS — B373 Candidiasis of vulva and vagina: Secondary | ICD-10-CM | POA: Diagnosis not present

## 2018-06-08 DIAGNOSIS — F321 Major depressive disorder, single episode, moderate: Secondary | ICD-10-CM | POA: Diagnosis not present

## 2018-06-18 DIAGNOSIS — N390 Urinary tract infection, site not specified: Secondary | ICD-10-CM | POA: Diagnosis not present

## 2018-06-22 DIAGNOSIS — R300 Dysuria: Secondary | ICD-10-CM | POA: Diagnosis not present

## 2018-06-22 DIAGNOSIS — R350 Frequency of micturition: Secondary | ICD-10-CM | POA: Diagnosis not present

## 2018-06-22 DIAGNOSIS — Z113 Encounter for screening for infections with a predominantly sexual mode of transmission: Secondary | ICD-10-CM | POA: Diagnosis not present

## 2018-07-02 ENCOUNTER — Ambulatory Visit: Payer: BLUE CROSS/BLUE SHIELD | Admitting: Family Medicine

## 2018-07-07 DIAGNOSIS — F321 Major depressive disorder, single episode, moderate: Secondary | ICD-10-CM | POA: Diagnosis not present

## 2018-07-21 NOTE — L&D Delivery Note (Signed)
Delivery Note At 1:08 AM a viable female was delivered via Vaginal, Spontaneous (Presentation: LOA).  APGAR: 8, 9; weight pending.   Placenta status: spontaneous, intact.  Cord: 3 vessels   Anesthesia:  epidural Episiotomy: None Lacerations: Periurethral;Cervical Suture Repair: 2.0 vicryl, repair done by Dr. Dione Plover Est. Blood Loss (mL):  150  Patient desired one hour of delayed cord clamping. After one hour, cord clamped by CNM and cut by FOB.  Mom to postpartum.  Baby to Couplet care / Skin to Skin.  Wende Mott CNM 04/11/2019, 2:18 AM

## 2018-07-31 ENCOUNTER — Emergency Department (HOSPITAL_COMMUNITY)
Admission: EM | Admit: 2018-07-31 | Discharge: 2018-07-31 | Disposition: A | Discharge status: Home / Self Care | Payer: Medicaid Other | Attending: Emergency Medicine | Admitting: Emergency Medicine

## 2018-07-31 ENCOUNTER — Emergency Department (HOSPITAL_COMMUNITY): Payer: Medicaid Other

## 2018-07-31 ENCOUNTER — Encounter (HOSPITAL_COMMUNITY): Payer: Self-pay

## 2018-07-31 DIAGNOSIS — S161XXA Strain of muscle, fascia and tendon at neck level, initial encounter: Secondary | ICD-10-CM | POA: Insufficient documentation

## 2018-07-31 DIAGNOSIS — Y929 Unspecified place or not applicable: Secondary | ICD-10-CM | POA: Diagnosis not present

## 2018-07-31 DIAGNOSIS — Y939 Activity, unspecified: Secondary | ICD-10-CM | POA: Diagnosis not present

## 2018-07-31 DIAGNOSIS — R457 State of emotional shock and stress, unspecified: Secondary | ICD-10-CM | POA: Diagnosis not present

## 2018-07-31 DIAGNOSIS — Y999 Unspecified external cause status: Secondary | ICD-10-CM | POA: Insufficient documentation

## 2018-07-31 DIAGNOSIS — F29 Unspecified psychosis not due to a substance or known physiological condition: Secondary | ICD-10-CM | POA: Diagnosis not present

## 2018-07-31 DIAGNOSIS — Z79899 Other long term (current) drug therapy: Secondary | ICD-10-CM | POA: Diagnosis not present

## 2018-07-31 DIAGNOSIS — M5489 Other dorsalgia: Secondary | ICD-10-CM | POA: Diagnosis not present

## 2018-07-31 DIAGNOSIS — M542 Cervicalgia: Secondary | ICD-10-CM | POA: Diagnosis present

## 2018-07-31 DIAGNOSIS — R52 Pain, unspecified: Secondary | ICD-10-CM | POA: Diagnosis not present

## 2018-07-31 MED ORDER — METHOCARBAMOL 500 MG PO TABS: 500.0000 mg | ORAL_TABLET | Freq: Two times a day (BID) | ORAL | 0 refills | Status: DC

## 2018-07-31 MED ORDER — ACETAMINOPHEN 325 MG PO TABS
650.0000 mg | ORAL_TABLET | Freq: Once | ORAL | Status: AC
  Administered 2018-07-31: 650 mg via ORAL
  Filled 2018-07-31: qty 2

## 2018-07-31 MED ORDER — DICLOFENAC SODIUM 1 % TD GEL: 2.0000 g | Freq: Four times a day (QID) | TRANSDERMAL | 0 refills | Status: DC

## 2018-07-31 NOTE — ED Notes (Signed)
Patient transported to CT 
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
## 2018-07-31 NOTE — ED Notes (Signed)
Patient verbalizes understanding of discharge instructions. Opportunity for questioning and answers were provided. Armband removed by staff, pt discharged from ED ambulatory.   
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
## 2018-08-03 ENCOUNTER — Encounter (HOSPITAL_COMMUNITY): Payer: Self-pay | Admitting: Emergency Medicine

## 2018-08-03 ENCOUNTER — Inpatient Hospital Stay (HOSPITAL_COMMUNITY): Payer: Medicaid Other

## 2018-08-03 ENCOUNTER — Inpatient Hospital Stay (HOSPITAL_COMMUNITY)
Admission: AD | Admit: 2018-08-03 | Discharge: 2018-08-03 | Disposition: A | Discharge status: Home / Self Care | Payer: Medicaid Other | Attending: Obstetrics & Gynecology | Admitting: Obstetrics & Gynecology

## 2018-08-03 ENCOUNTER — Other Ambulatory Visit: Payer: Self-pay

## 2018-08-03 ENCOUNTER — Encounter (HOSPITAL_COMMUNITY): Payer: Self-pay | Admitting: Medical

## 2018-08-03 ENCOUNTER — Ambulatory Visit (HOSPITAL_COMMUNITY)
Admission: EM | Admit: 2018-08-03 | Discharge: 2018-08-03 | Disposition: A | Discharge status: Home / Self Care | Payer: Medicaid Other | Attending: Family Medicine | Admitting: Family Medicine

## 2018-08-03 DIAGNOSIS — R1030 Lower abdominal pain, unspecified: Secondary | ICD-10-CM | POA: Insufficient documentation

## 2018-08-03 DIAGNOSIS — N912 Amenorrhea, unspecified: Secondary | ICD-10-CM

## 2018-08-03 DIAGNOSIS — Z3201 Encounter for pregnancy test, result positive: Secondary | ICD-10-CM

## 2018-08-03 DIAGNOSIS — O26899 Other specified pregnancy related conditions, unspecified trimester: Secondary | ICD-10-CM

## 2018-08-03 DIAGNOSIS — S39012A Strain of muscle, fascia and tendon of lower back, initial encounter: Secondary | ICD-10-CM

## 2018-08-03 DIAGNOSIS — O3680X0 Pregnancy with inconclusive fetal viability, not applicable or unspecified: Secondary | ICD-10-CM | POA: Diagnosis not present

## 2018-08-03 DIAGNOSIS — R109 Unspecified abdominal pain: Secondary | ICD-10-CM

## 2018-08-03 DIAGNOSIS — O26891 Other specified pregnancy related conditions, first trimester: Secondary | ICD-10-CM | POA: Insufficient documentation

## 2018-08-03 DIAGNOSIS — Z3A01 Less than 8 weeks gestation of pregnancy: Secondary | ICD-10-CM | POA: Diagnosis not present

## 2018-08-03 HISTORY — DX: Gonococcal infection, unspecified: A54.9

## 2018-08-03 HISTORY — DX: Chlamydial infection, unspecified: A74.9

## 2018-08-03 HISTORY — DX: Anemia, unspecified: D64.9

## 2018-08-03 LAB — POCT URINALYSIS DIP (DEVICE)
Bilirubin Urine: NEGATIVE
Glucose, UA: NEGATIVE mg/dL
Hgb urine dipstick: NEGATIVE
Ketones, ur: NEGATIVE mg/dL
Leukocytes, UA: NEGATIVE
NITRITE: NEGATIVE
PH: 7.5 (ref 5.0–8.0)
PROTEIN: NEGATIVE mg/dL
Specific Gravity, Urine: 1.02 (ref 1.005–1.030)
Urobilinogen, UA: 1 mg/dL (ref 0.0–1.0)

## 2018-08-03 LAB — CBC WITH DIFFERENTIAL/PLATELET
BASOS PCT: 1 %
Basophils Absolute: 0 10*3/uL (ref 0.0–0.1)
EOS PCT: 1 %
Eosinophils Absolute: 0 10*3/uL (ref 0.0–0.5)
HEMATOCRIT: 37.2 % (ref 36.0–46.0)
Hemoglobin: 12.2 g/dL (ref 12.0–15.0)
Lymphocytes Relative: 38 %
Lymphs Abs: 2 10*3/uL (ref 0.7–4.0)
MCH: 29.2 pg (ref 26.0–34.0)
MCHC: 32.8 g/dL (ref 30.0–36.0)
MCV: 89 fL (ref 80.0–100.0)
MONO ABS: 0.4 10*3/uL (ref 0.1–1.0)
Monocytes Relative: 7 %
NEUTROS ABS: 3 10*3/uL (ref 1.7–7.7)
Neutrophils Relative %: 55 %
Platelets: 344 10*3/uL (ref 150–400)
RBC: 4.18 MIL/uL (ref 3.87–5.11)
RDW: 12.7 % (ref 11.5–15.5)
WBC: 5.4 10*3/uL (ref 4.0–10.5)
nRBC: 0 % (ref 0.0–0.2)

## 2018-08-03 LAB — ABO/RH: ABO/RH(D): B POS

## 2018-08-03 LAB — HCG, QUANTITATIVE, PREGNANCY: HCG, BETA CHAIN, QUANT, S: 883 m[IU]/mL — AB (ref <5)

## 2018-08-03 LAB — POCT PREGNANCY, URINE: PREG TEST UR: POSITIVE — AB

## 2018-08-03 LAB — CYTOLOGY - PAP: Pap: NEGATIVE

## 2018-08-03 NOTE — Discharge Instructions (Signed)
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
## 2018-08-03 NOTE — Discharge Instructions (Addendum)
Tylenol is safe to use for pain during pregnancy.
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
## 2018-08-03 NOTE — ED Notes (Signed)
advised patient of need for urine specimen.  Patient states she [place] unable to provide specimen at this time.

## 2018-08-04 LAB — RPR: RPR Ser Ql: NONREACTIVE

## 2018-08-04 LAB — HIV ANTIBODY (ROUTINE TESTING W REFLEX): HIV SCREEN 4TH GENERATION: NONREACTIVE

## 2018-08-06 ENCOUNTER — Ambulatory Visit (INDEPENDENT_AMBULATORY_CARE_PROVIDER_SITE_OTHER): Payer: Medicaid Other

## 2018-08-06 DIAGNOSIS — O3680X0 Pregnancy with inconclusive fetal viability, not applicable or unspecified: Secondary | ICD-10-CM

## 2018-08-06 LAB — HCG, QUANTITATIVE, PREGNANCY: hCG, Beta Chain, Quant, S: 2320 m[IU]/mL — ABNORMAL HIGH (ref <5)

## 2018-08-06 NOTE — Progress Notes (Signed)
Pt here today for STAT beta lab.  Pt denies any pain or bleeding.  Pt c/o constipation. I encouraged pt that she can take Docosute Sodium 100 mg po bid and eat foods with fiber.  I informed pt that she will be here for at least two hours in which she will get results and f/u.  Pt stated understanding with no further questions.   Notified Dr. Vergie Living, provider recommendation to have [place] in two weeks. OB US scheduled January 31st @ 0845.  Pt notified of provider recommendation and [place] appt.  Pt stated understanding with no further questions.   Addison Naegeli, RN

## 2018-08-09 NOTE — Progress Notes (Signed)
I have reviewed the chart and agree with nursing staff's documentation of this patient's encounter.  Rice Bing, MD 08/09/2018 8:54 AM

## 2018-08-16 ENCOUNTER — Inpatient Hospital Stay (HOSPITAL_COMMUNITY)
Admission: AD | Admit: 2018-08-16 | Discharge: 2018-08-16 | Disposition: A | Discharge status: Home / Self Care | Payer: Medicaid Other | Source: Ambulatory Visit | Attending: Obstetrics & Gynecology | Admitting: Obstetrics & Gynecology

## 2018-08-16 ENCOUNTER — Encounter (HOSPITAL_COMMUNITY): Payer: Self-pay

## 2018-08-16 ENCOUNTER — Inpatient Hospital Stay (HOSPITAL_COMMUNITY): Payer: Medicaid Other

## 2018-08-16 DIAGNOSIS — R109 Unspecified abdominal pain: Secondary | ICD-10-CM | POA: Diagnosis present

## 2018-08-16 DIAGNOSIS — O26899 Other specified pregnancy related conditions, unspecified trimester: Secondary | ICD-10-CM

## 2018-08-16 DIAGNOSIS — O26891 Other specified pregnancy related conditions, first trimester: Secondary | ICD-10-CM | POA: Insufficient documentation

## 2018-08-16 DIAGNOSIS — Z3A01 Less than 8 weeks gestation of pregnancy: Secondary | ICD-10-CM | POA: Insufficient documentation

## 2018-08-16 DIAGNOSIS — R1032 Left lower quadrant pain: Secondary | ICD-10-CM | POA: Insufficient documentation

## 2018-08-16 LAB — URINALYSIS, ROUTINE W REFLEX MICROSCOPIC
BILIRUBIN URINE: NEGATIVE
Glucose, UA: NEGATIVE mg/dL
HGB URINE DIPSTICK: NEGATIVE
KETONES UR: 20 mg/dL — AB
Leukocytes, UA: NEGATIVE
NITRITE: NEGATIVE
Protein, ur: NEGATIVE mg/dL
Specific Gravity, Urine: 1.009 (ref 1.005–1.030)
pH: 7 (ref 5.0–8.0)

## 2018-08-16 NOTE — MAU Provider Note (Addendum)
Chief Complaint: Abdominal Pain   First Provider Initiated Contact with Patient 08/16/18 1844     SUBJECTIVE HPI: Lori Weaver is a 23 y.o. G1P0 at [redacted]w[redacted]d who presents to Maternity Admissions reporting abdominal pain. Current symptoms started yesterday. Reports abdominal pain in LLQ. Denies n/v/d, constipation, dysuria, vaginal discharge, or vaginal bleeding. Last BM was earlier today and normal for her.  Previously had appropriately rising HCG & is scheduled for a f/u ultrasound later this week to confirm viability. Previous ultrasound on 1/14 showed empty IUGS.  Location: abdomen, LLQ Quality: cramping Severity: 8/10 on pain scale Duration: 1 day Timing: intermittent Modifying factors: none Associated signs and symptoms: none  Past Medical History:  Diagnosis Date  . Allergy   . Anemia   . Anxiety   . Chlamydia   . Depression   . Dysmenorrhea   . Gonorrhea    OB History  Gravida Para Term Preterm AB Living  1            SAB TAB Ectopic Multiple Live Births               # Outcome Date GA Lbr Len/2nd Weight Sex Delivery Anes PTL Lv  1 Current            Past Surgical History:  Procedure Laterality Date  . LAPAROSCOPY N/A 05/06/2017   Procedure: LAPAROSCOPY DIAGNOSTIC;  Surgeon: Tereso Newcomer, MD;  Location: [facility];  Service: Gynecology;  Laterality: N/A;  . LAPAROSCOPY ABDOMEN DIAGNOSTIC    . TONSILLECTOMY AND ADENOIDECTOMY    . TYMPANOSTOMY TUBE PLACEMENT Bilateral   . WISDOM TOOTH EXTRACTION     Social History   Socioeconomic History  . Marital status: Single    Spouse name: Not on file  . Number of children: Not on file  . Years of education: Not on file  . Highest education level: Not on file  Occupational History  . Not on file  Social Needs  . Financial resource strain: Not on file  . Food insecurity:    Worry: Not on file    Inability: Not on file  . Transportation needs:    Medical: Not on file    Non-medical: Not on file   Tobacco Use  . Smoking status: Never Smoker  . Smokeless tobacco: Never Used  Substance and Sexual Activity  . Alcohol use: Not Currently  . Drug use: Not Currently    Types: Marijuana  . Sexual activity: Yes    Birth control/protection: None  Lifestyle  . Physical activity:    Days per week: Not on file    Minutes per session: Not on file  . Stress: Not on file  Relationships  . Social connections:    Talks on phone: Not on file    Gets together: Not on file    Attends religious service: Not on file    Active member of club or organization: Not on file    Attends meetings of clubs or organizations: Not on file    Relationship status: Not on file  . Intimate partner violence:    Fear of current or ex partner: Not on file    Emotionally abused: Not on file    Physically abused: Not on file    Forced sexual activity: Not on file  Other Topics Concern  . 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  Plans to get  a masters degree.  Plans to go to PA school.   Family History  Problem Relation Age of Onset  . Lupus Mother   . Depression Mother   . Anxiety disorder Mother   . Lupus Maternal Grandmother    No current facility-administered medications on file prior to encounter.    Current Outpatient Medications on File Prior to Encounter  Medication Sig Dispense Refill  . buPROPion (WELLBUTRIN XL) 150 MG 24 hr tablet Take 150 mg by mouth daily.      Allergies  Allergen Reactions  . Fish Allergy Anaphylaxis  . Soap Rash    Use for Pre-op    I have reviewed patient's Past Medical Hx, Surgical Hx, Family Hx, Social Hx, medications and allergies.   Review of Systems  Constitutional: Negative.   Gastrointestinal: Positive for abdominal pain. Negative for constipation, diarrhea, nausea and vomiting.  Genitourinary: Negative.     OBJECTIVE Patient Vitals for the past 24 hrs:  BP Temp Temp src Pulse  Resp SpO2 Height Weight  08/16/18 1745 (!) 112/50 98.2 F (36.8 C) Oral 74 15 100 % 4\' 11"  (1.499 m) 51.3 kg   Constitutional: Well-developed, well-nourished female in no acute distress.  Cardiovascular: normal rate & rhythm, no murmur Respiratory: normal rate and effort. Lung sounds clear throughout GI: Abd soft, non-tender, Pos BS x 4. No guarding or rebound tenderness MS: Extremities nontender, no edema, normal ROM Neurologic: Alert and oriented x 4.      LAB RESULTS Results for orders placed or performed during the hospital encounter of 08/16/18 (from the past 24 hour(s))  Urinalysis, Routine w reflex microscopic     Status: Abnormal   Collection Time: 08/16/18  5:59 PM  Result Value Ref Range   Color, Urine YELLOW YELLOW   APPearance CLEAR CLEAR   Specific Gravity, Urine 1.009 1.005 - 1.030   pH 7.0 5.0 - 8.0   Glucose, UA NEGATIVE NEGATIVE mg/dL   Hgb urine dipstick NEGATIVE NEGATIVE   Bilirubin Urine NEGATIVE NEGATIVE   Ketones, ur 20 (A) NEGATIVE mg/dL   Protein, ur NEGATIVE NEGATIVE mg/dL   Nitrite NEGATIVE NEGATIVE   Leukocytes, UA NEGATIVE NEGATIVE    IMAGING No results found.  MAU COURSE Orders Placed This Encounter  Procedures  . US OB Transvaginal  . Urinalysis, Routine w reflex microscopic   No orders of the defined types were placed in this encounter.   MDM  Ultrasound ordered Care turned over to Dr. Chad Cordialatherine Kersten Salmons  Lawrence, Erin, NP 08/16/2018  7:13 PM  [place] Ob Transvaginal  Result Date: 08/16/2018 CLINICAL DATA:  Left-sided abdominal and pelvic pain in 1st trimester pregnancy. First trimester pregnancy with inconclusive fetal viability. EXAM: TRANSVAGINAL OB ULTRASOUND TECHNIQUE: Transvaginal ultrasound was performed for complete evaluation of the gestation as well as the maternal uterus, adnexal regions, and pelvic cul-de-sac. COMPARISON:  08/03/2018 FINDINGS: Intrauterine gestational sac: Single Yolk sac:  Visualized. Embryo:  Visualized.  Cardiac Activity: Visualized. Heart Rate: 110 bpm CRL:   5 mm   6 w 1 d                  US EDC: 04/10/2019 Subchorionic hemorrhage:  None visualized. Maternal uterus/adnexae: 3.2 cm simple right ovarian cyst noted. Normal appearance of left ovary. No adnexal mass identified. Tiny amount of simple free fluid noted. IMPRESSION: Single living IUP measuring 6 weeks 1 day, with US EDC of 04/10/2019. No significant maternal uterine or adnexal abnormality identified. Electronically Signed   By: Myles RosenthalJohn  Stahl  M.D.   On: 08/16/2018 20:11   Assessment/Plan  1. Abdominal pain affecting pregnancy   2. Abdominal pain during pregnancy in first trimester   3. [redacted] weeks gestation of pregnancy    Ultrasound shows viable IUP without abnormalities. Ectopic pregnancy has been ruled out. Patient does not have any vaginal bleeding. Had recent bhcg with appropriate trend. Establish care with Ob. Strict return precautions discussed.   Marcy Sirenatherine Terrion Gencarelli, D.O. [facility] Fellow, [facility], [facility] 08/16/2018, 8:31 PM

## 2018-08-16 NOTE — Discharge Instructions (Signed)
Your ultrasound shows a live pregnancy in the uterus measuring 6 weeks. No abnormalities were visualized. Please return if you have vaginal bleeding, increase in your pain, fevers, significant vomiting.

## 2018-08-16 NOTE — MAU Note (Signed)
Pt reports pain on left side today, pain is mid left. Denies bleeding.

## 2018-08-20 ENCOUNTER — Ambulatory Visit (HOSPITAL_COMMUNITY): Payer: No Typology Code available for payment source

## 2018-08-27 ENCOUNTER — Encounter (HOSPITAL_COMMUNITY): Payer: Self-pay | Admitting: Emergency Medicine

## 2018-08-27 ENCOUNTER — Other Ambulatory Visit: Payer: Self-pay

## 2018-08-27 ENCOUNTER — Ambulatory Visit (HOSPITAL_COMMUNITY)
Admission: EM | Admit: 2018-08-27 | Discharge: 2018-08-27 | Disposition: A | Discharge status: Home / Self Care | Payer: Medicaid Other | Attending: Internal Medicine | Admitting: Internal Medicine

## 2018-08-27 DIAGNOSIS — J029 Acute pharyngitis, unspecified: Secondary | ICD-10-CM

## 2018-08-27 NOTE — ED Triage Notes (Signed)
The patient presented to the [facility] with a complaint of a sore throat and headache x 1 day.

## 2018-08-27 NOTE — ED Provider Notes (Signed)
[facility]    CSN: [id number] Arrival date & time: 08/27/18  1825     History   Chief Complaint Chief Complaint  Patient presents with  . Sore Throat    HPI Lori Weaver is a 23 y.o. female with no past medical history comes to the urgent care with 1 day history of sore throat.  Sore throat started this morning.  It worsened with swallowing.  She has not tried any over-the-counter relief agents.  Not associated with fever or chills.  No shortness of breath. HPI  Past Medical History:  Diagnosis Date  . Allergy   . Anemia   . Anxiety   . Chlamydia   . Depression   . Dysmenorrhea   . Gonorrhea     Patient Active Problem List   Diagnosis Date Noted  . Cough 04/28/2018  . Irritant contact dermatitis 01/19/2018  . Screen for STD (sexually transmitted disease) 01/19/2018  . Encounter to establish care 12/02/2017  . Family history of lupus erythematosus 12/02/2017  . Depression 12/02/2017  . Acne vulgaris 12/02/2017  . Dysmenorrhea 05/06/2017    Past Surgical History:  Procedure Laterality Date  . LAPAROSCOPY N/A 05/06/2017   Procedure: LAPAROSCOPY DIAGNOSTIC;  Surgeon: Tereso NewcomerAnyanwu, Ugonna A, MD;  Location: [facility];  Service: Gynecology;  Laterality: N/A;  . LAPAROSCOPY ABDOMEN DIAGNOSTIC    . TONSILLECTOMY AND ADENOIDECTOMY    . TYMPANOSTOMY TUBE PLACEMENT Bilateral   . WISDOM TOOTH EXTRACTION      OB History    Gravida  1   Para      Term      Preterm      AB      Living        SAB      TAB      Ectopic      Multiple      Live Births               Home Medications    Prior to Admission medications   Medication Sig Start Date End Date Taking? Authorizing Provider  buPROPion (WELLBUTRIN XL) 150 MG 24 hr tablet Take 150 mg by mouth daily.    Yes [provider]  Prenatal Vit-Fe Fumarate-FA (PRENATAL MULTIVITAMIN) TABS tablet Take 1 tablet by mouth daily at 12 noon.   Yes [provider]     Family History Family History  Problem Relation Age of Onset  . Lupus Mother   . Depression Mother   . Anxiety disorder Mother   . Lupus Maternal Grandmother     Social History Social History   Tobacco Use  . Smoking status: Never Smoker  . Smokeless tobacco: Never Used  Substance Use Topics  . Alcohol use: Not Currently  . Drug use: Not Currently    Types: Marijuana     Allergies   Fish allergy and Soap   Review of Systems Review of Systems  Constitutional: Negative for activity change and appetite change.  HENT: Positive for congestion and sore throat. Negative for ear discharge, ear pain, hearing loss, mouth sores and rhinorrhea.   Eyes: Negative for discharge and itching.  Respiratory: Positive for cough and chest tightness. Negative for shortness of breath and wheezing.   Gastrointestinal: Negative for abdominal distention and abdominal pain.  Neurological: Positive for headaches. Negative for dizziness and numbness.  Hematological: Negative for adenopathy.     Physical Exam Triage Vital Signs ED Triage Vitals  Enc Vitals Group  BP 08/27/18 1848 117/60     Pulse Rate 08/27/18 1848 80     Resp 08/27/18 1848 16     Temp 08/27/18 1848 99.3 F (37.4 C)     Temp Source 08/27/18 1848 Temporal     SpO2 08/27/18 1848 100 %     Weight --      Height --      Head Circumference --      Peak Flow --      Pain Score 08/27/18 1846 8     Pain Loc --      Pain Edu? --      Excl. in GC? --    No data found.  Updated Vital Signs BP 117/60 (BP Location: Right Arm)   Pulse 80   Temp 99.3 F (37.4 C) (Temporal)   Resp 16   LMP 07/01/2018   SpO2 100%   Visual Acuity Right Eye Distance:   Left Eye Distance:   Bilateral Distance:    Right Eye Near:   Left Eye Near:    Bilateral Near:     Physical Exam Constitutional:      Appearance: She is normal weight. She is ill-appearing.  HENT:     Right Ear: Tympanic membrane normal. No drainage or  swelling.     Left Ear: Tympanic membrane normal. No drainage or swelling.     Nose: Congestion present. No rhinorrhea.     Mouth/Throat:     Mouth: Mucous membranes are moist.     Pharynx: Posterior oropharyngeal erythema present. No pharyngeal swelling, oropharyngeal exudate or uvula swelling.     Tonsils: No tonsillar exudate or tonsillar abscesses. Swelling: 0 on the right. 0 on the left.  Eyes:     Conjunctiva/sclera: Conjunctivae normal.     Pupils: Pupils are equal, round, and reactive to light.  Neck:     Musculoskeletal: Normal range of motion and neck supple.  Lymphadenopathy:     Cervical: No cervical adenopathy.  Neurological:     Mental Status: She is alert.      UC Treatments / Results  Labs (all labs ordered are listed, but only abnormal results are displayed) Labs Reviewed - No data to display  EKG None  Radiology No results found.  Procedures Procedures (including critical care time)  Medications Ordered in UC Medications - No data to display  Initial Impression / Assessment and Plan / UC Course  I have reviewed the triage vital signs and the nursing notes.  Pertinent labs & imaging results that were available during my care of the patient were reviewed by me and considered in my medical decision making (see chart for details).     1.  Acute viral pharyngitis: Symptomatic treatment: -Tylenol/NSAIDs for pain/fever. -Cepacol lozenges for sore throat - Final Clinical Impressions(s) / UC Diagnoses   Final diagnoses:  Viral pharyngitis   Discharge Instructions   None    ED Prescriptions    None     Controlled Substance Prescriptions [place] Controlled Substance Registry consulted? No   Merrilee Jansky, MD 08/27/18 [phone]

## 2018-09-08 ENCOUNTER — Other Ambulatory Visit: Payer: Self-pay

## 2018-09-08 ENCOUNTER — Inpatient Hospital Stay (HOSPITAL_COMMUNITY)
Admission: AD | Admit: 2018-09-08 | Discharge: 2018-09-08 | Disposition: A | Discharge status: Home / Self Care | Payer: Medicaid Other | Attending: Family Medicine | Admitting: Family Medicine

## 2018-09-08 ENCOUNTER — Encounter (HOSPITAL_COMMUNITY): Payer: Self-pay

## 2018-09-08 DIAGNOSIS — Z3A09 9 weeks gestation of pregnancy: Secondary | ICD-10-CM | POA: Insufficient documentation

## 2018-09-08 DIAGNOSIS — R109 Unspecified abdominal pain: Secondary | ICD-10-CM | POA: Diagnosis present

## 2018-09-08 DIAGNOSIS — R1030 Lower abdominal pain, unspecified: Secondary | ICD-10-CM | POA: Diagnosis not present

## 2018-09-08 DIAGNOSIS — Z3A10 10 weeks gestation of pregnancy: Secondary | ICD-10-CM | POA: Diagnosis not present

## 2018-09-08 DIAGNOSIS — O26891 Other specified pregnancy related conditions, first trimester: Secondary | ICD-10-CM | POA: Insufficient documentation

## 2018-09-08 LAB — URINALYSIS, ROUTINE W REFLEX MICROSCOPIC
Bilirubin Urine: NEGATIVE
Glucose, UA: NEGATIVE mg/dL
Hgb urine dipstick: NEGATIVE
Ketones, ur: NEGATIVE mg/dL
Nitrite: NEGATIVE
Protein, ur: NEGATIVE mg/dL
Specific Gravity, Urine: 1.015 (ref 1.005–1.030)
pH: 7 (ref 5.0–8.0)

## 2018-09-08 LAB — URINALYSIS, MICROSCOPIC (REFLEX)

## 2018-09-08 MED ORDER — ACETAMINOPHEN 500 MG PO TABS
1000.0000 mg | ORAL_TABLET | Freq: Once | ORAL | Status: AC
  Administered 2018-09-08: 1000 mg via ORAL
  Filled 2018-09-08: qty 2

## 2018-09-08 NOTE — Discharge Instructions (Signed)
Abdominal Pain During Pregnancy ° °Abdominal pain is common during pregnancy, and has many possible causes. Some causes are more serious than others, and sometimes the cause is not known. Abdominal pain can be a sign that labor is starting. It can also be caused by normal growth and stretching of muscles and ligaments during pregnancy. Always tell your health care provider if you have any abdominal pain. °Follow these instructions at home: °· Do not have sex or put anything in your vagina until your pain goes away completely. °· Get plenty of rest until your pain improves. °· Drink enough fluid to keep your urine pale yellow. °· Take over-the-counter and prescription medicines only as told by your health care provider. °· Keep all follow-up visits as told by your health care provider. This is important. °Contact a health care provider if: °· Your pain continues or gets worse after resting. °· You have lower abdominal pain that: °? Comes and goes at regular intervals. °? Spreads to your back. °? Is similar to menstrual cramps. °· You have pain or burning when you urinate. °Get help right away if: °· You have a fever or chills. °· You have vaginal bleeding. °· You are leaking fluid from your vagina. °· You are passing tissue from your vagina. °· You have vomiting or diarrhea that lasts for more than 24 hours. °· Your baby is moving less than usual. °· You feel very weak or faint. °· You have shortness of breath. °· You develop severe pain in your upper abdomen. °Summary °· Abdominal pain is common during pregnancy, and has many possible causes. °· If you experience abdominal pain during pregnancy, tell your health care provider right away. °· Follow your health care provider's home care instructions and keep all follow-up visits as directed. °This information is not intended to replace advice given to you by your health care provider. Make sure you discuss any questions you have with your health care  provider. °Document Released: 07/07/2005 Document Revised: 10/09/2016 Document Reviewed: 10/09/2016 °Elsevier Interactive Patient Education © 2019 [employer] Inc[employer] ° °

## 2018-09-08 NOTE — MAU Provider Note (Signed)
History     CSN: [id number]  Arrival date and time: 09/08/18 3893   First Provider Initiated Contact with Patient 09/08/18 1909      Chief Complaint  Patient presents with  . Abdominal Pain   Lori Weaver is a 23 y.o. G1P0 at [redacted]w[redacted]d who presents for Abdominal Pain.  Patient states her pain has been present intermittently in the lower abdominal area since yesterday and is worse when she "is really stressed out."  Patient describes the pain as pressure and rates it as about a  6/10.  She states it is present for minutes when it occurs and is every couple of hours.  However, she is able to sleep without incident.  Patient denies vaginal bleeding, discharge, itching, or odor.  She also denies issues with urination and bowel movements.  She has not taken anything for the pain today and reports taking one tablet of tylenol yesterday with improvement. Patient reports that she "just wants to make sure the baby is okay."     OB History    Gravida  1   Para      Term      Preterm      AB      Living        SAB      TAB      Ectopic      Multiple      Live Births              Past Medical History:  Diagnosis Date  . Allergy   . Anemia   . Anxiety   . Chlamydia   . Depression   . Dysmenorrhea   . Gonorrhea     Past Surgical History:  Procedure Laterality Date  . LAPAROSCOPY N/A 05/06/2017   Procedure: LAPAROSCOPY DIAGNOSTIC;  Surgeon: Tereso Newcomer, MD;  Location: [facility];  Service: Gynecology;  Laterality: N/A;  . LAPAROSCOPY ABDOMEN DIAGNOSTIC    . TONSILLECTOMY AND ADENOIDECTOMY    . TYMPANOSTOMY TUBE PLACEMENT Bilateral   . WISDOM TOOTH EXTRACTION      Family History  Problem Relation Age of Onset  . Lupus Mother   . Depression Mother   . Anxiety disorder Mother   . Lupus Maternal Grandmother     Social History   Tobacco Use  . Smoking status: Never Smoker  . Smokeless tobacco: Never Used  Substance Use Topics  . Alcohol  use: Not Currently  . Drug use: Not Currently    Types: Marijuana    Allergies:  Allergies  Allergen Reactions  . Fish Allergy Anaphylaxis  . Soap Rash    Use for Pre-op    Medications Prior to Admission  Medication Sig Dispense Refill Last Dose  . buPROPion (WELLBUTRIN XL) 150 MG 24 hr tablet Take 150 mg by mouth daily.    08/27/2018 at Unknown time  . Prenatal Vit-Fe Fumarate-FA (PRENATAL MULTIVITAMIN) TABS tablet Take 1 tablet by mouth daily at 12 noon.   08/27/2018 at Unknown time    Review of Systems  Constitutional: Negative for chills and fever.  Gastrointestinal: Negative for nausea and vomiting.  Genitourinary: Positive for pelvic pain. Negative for dysuria, vaginal bleeding and vaginal discharge.  Neurological: Negative for dizziness, light-headedness and headaches.   Physical Exam   Blood pressure 121/61, pulse (!) 104, temperature 98.3 F (36.8 C), temperature source Oral, resp. rate 16, weight 52.7 kg, last menstrual period 07/01/2018, SpO2 100 %.  Physical Exam  Constitutional:  She is oriented to person, place, and time. She appears well-developed and well-nourished.  HENT:  Head: Normocephalic and atraumatic.  Eyes: Conjunctivae are normal.  Neck: Normal range of motion.  Cardiovascular: Normal rate, regular rhythm and normal heart sounds.  Respiratory: Effort normal and breath sounds normal.  GI: Soft. Bowel sounds are normal.  Genitourinary:    Genitourinary Comments: Deferred   Musculoskeletal: Normal range of motion.  Neurological: She is oriented to person, place, and time.  Skin: Skin is warm and dry.  Psychiatric: She has a normal mood and affect. Her behavior is normal.    MAU Course  Procedures  Results for orders placed or performed during the hospital encounter of 09/08/18 (from the past 24 hour(s))  Urinalysis, Routine w reflex microscopic     Status: Abnormal   Collection Time: 09/08/18  7:10 PM  Result Value Ref Range   Color, Urine YELLOW  YELLOW   APPearance HAZY (A) CLEAR   Specific Gravity, Urine 1.015 1.005 - 1.030   pH 7.0 5.0 - 8.0   Glucose, UA NEGATIVE NEGATIVE mg/dL   Hgb urine dipstick NEGATIVE NEGATIVE   Bilirubin Urine NEGATIVE NEGATIVE   Ketones, ur NEGATIVE NEGATIVE mg/dL   Protein, ur NEGATIVE NEGATIVE mg/dL   Nitrite NEGATIVE NEGATIVE   Leukocytes,Ua TRACE (A) NEGATIVE  Urinalysis, Microscopic (reflex)     Status: Abnormal   Collection Time: 09/08/18  7:10 PM  Result Value Ref Range   RBC / HPF 0-5 0 - 5 RBC/hpf   WBC, UA 6-10 0 - 5 WBC/hpf   Bacteria, UA MANY (A) NONE SEEN   Squamous Epithelial / LPF 6-10 0 - 5   Amorphous Crystal PRESENT      MDM PE UA BS [place] Pain Medication  Assessment and Plan  23 year old G1P0 at 9.6 weeks Abdominal Pain  -BS [place] completed with FHR at 171 with movement noted. *Pic given to patient who was appreciative. -UA pending -Exam findings discussed. -Informed that provider will not be addressing anxiety tonight. -Informed that note will be made for MH screening at initial ob appt.  -Will give tylenol XR for pain. -Will assess after pain medication.  Follow Up (8:03 PM)  -Patient reports improvement of pain with tylenol dosing. -Reports pain now 0/10. -Discussed safety of tylenol usage in pregnancy and for pain management.  Instructed to take 2 tablets Q 8 hours prn for pain.  -Awaiting UA results, lab called and reports will complete immediately.  Follow Up (8:15 PM)  -UA returns with many bacteria, will send for culture -Patient notified of POC -No questions or concerns -Encouraged to call or return to MAU if symptoms worsen or with the onset of new symptoms. -Discharged to home in improved condition.   Cherre Robins MSN, CNM 09/08/2018, 7:09 PM

## 2018-09-08 NOTE — MAU Note (Signed)
Just having abd pain. No bleeding or cramping. Just been stressed out a lot. Boobs are no longer hurting, no longer nauseated. Been having a lot of problems with her depression and anxiety.

## 2018-09-10 LAB — CULTURE, OB URINE: Culture: 40000 — AB

## 2018-09-15 ENCOUNTER — Encounter: Payer: Self-pay | Admitting: Family Medicine

## 2018-09-15 ENCOUNTER — Ambulatory Visit (INDEPENDENT_AMBULATORY_CARE_PROVIDER_SITE_OTHER): Payer: Medicaid Other | Admitting: Medical

## 2018-09-15 ENCOUNTER — Other Ambulatory Visit (HOSPITAL_COMMUNITY)
Admission: RE | Admit: 2018-09-15 | Discharge: 2018-09-15 | Disposition: A | Discharge status: Home / Self Care | Payer: Medicaid Other | Source: Ambulatory Visit | Attending: Medical | Admitting: Medical

## 2018-09-15 ENCOUNTER — Encounter: Payer: Self-pay | Admitting: Internal Medicine

## 2018-09-15 ENCOUNTER — Encounter: Payer: Self-pay | Admitting: Medical

## 2018-09-15 ENCOUNTER — Ambulatory Visit: Payer: Medicaid Other | Admitting: Clinical

## 2018-09-15 VITALS — BP 123/57 | HR 87 | Wt 108.9 lb

## 2018-09-15 DIAGNOSIS — O99341 Other mental disorders complicating pregnancy, first trimester: Secondary | ICD-10-CM

## 2018-09-15 DIAGNOSIS — Z3480 Encounter for supervision of other normal pregnancy, unspecified trimester: Secondary | ICD-10-CM | POA: Insufficient documentation

## 2018-09-15 DIAGNOSIS — Z3481 Encounter for supervision of other normal pregnancy, first trimester: Secondary | ICD-10-CM | POA: Diagnosis not present

## 2018-09-15 DIAGNOSIS — F321 Major depressive disorder, single episode, moderate: Secondary | ICD-10-CM

## 2018-09-15 DIAGNOSIS — Z3A10 10 weeks gestation of pregnancy: Secondary | ICD-10-CM

## 2018-09-15 DIAGNOSIS — F419 Anxiety disorder, unspecified: Secondary | ICD-10-CM | POA: Insufficient documentation

## 2018-09-15 HISTORY — DX: Encounter for supervision of other normal pregnancy, unspecified trimester: Z34.80

## 2018-09-15 NOTE — Patient Instructions (Signed)
Childbirth Education Options: [facility] Department Classes:  Childbirth education classes can help you get ready for a positive parenting experience. You can also meet other expectant parents and get free stuff for your baby. Each class runs for five weeks on the same night and costs $45 for the mother-to-be and her support person. Medicaid covers the cost if you are eligible. Call [phone] to register. [facility] Childbirth Education:  [phone] or [phone] or [email or web address]  Baby & Me Class: Discuss newborn & infant parenting and family adjustment issues with other new mothers in a relaxed environment. Each week brings a new speaker or baby-centered activity. We encourage new mothers to join [place] every Thursday at 11:00am. Babies birth until crawling. No registration or fee. Daddy [employer]: This course offers Dads-to-be the tools and knowledge needed to feel confident on their journey to becoming new fathers. Experienced dads, who have been trained as coaches, teach dads-to-be how to hold, comfort, diaper, swaddle and play with their infant while being able to support the new mom as well. A class for men taught by men. $25/dad Big Brother/Big Sister: Let your children share in the joy of a new brother or sister in this special class designed just for them. Class includes discussion about how families care for babies: swaddling, holding, diapering, safety as well as how they can be helpful in their new role. This class is designed for children ages 45 to 48, but any age is welcome. Please register each child individually. $5/child  Mom Talk: This mom-led group offers support and connection to mothers as they journey through the adjustments and struggles of that sometimes overwhelming first year after the birth of a child. Tuesdays at 10:00am and Thursdays at 6:00pm. Babies welcome. No registration or fee. Breastfeeding Support Group: This group is a mother-to-mother  support circle where moms have the opportunity to share their breastfeeding experiences. A Lactation Consultant is present for questions and concerns. Meets each Tuesday at 11:00am. No fee or registration. Breastfeeding Your Baby: Learn what to expect in the first days of breastfeeding your newborn.  This class will help you feel more confident with the skills needed to begin your breastfeeding experience. Many new mothers are concerned about breastfeeding after leaving the hospital. This class will also address the most common fears and challenges about breastfeeding during the first few weeks, months and beyond. (call for fee) Comfort Techniques and Tour: This 2 hour interactive class will provide you the opportunity to learn & practice hands-on techniques that can help relieve some of the discomfort of labor and encourage your baby to rotate toward the best position for birth. You and your partner will be able to try a variety of labor positions with birth balls and rebozos as well as practice breathing, relaxation, and visualization techniques. A tour of the [facility] is included with this class. $20 per registrant and support person Childbirth Class- Weekend Option: This class is a Weekend version of our Birth & Baby series. It is designed for parents who have a difficult time fitting several weeks of classes into their schedule. It covers the care of your newborn and the basics of labor and childbirth. It also includes a [place] of [facility] and lunch. The class is held two consecutive days: beginning on Friday evening from 6:30 - 8:30 p.m. and the next day, Saturday from 9 a.m. - 4 p.m. (call for fee) Doren Custard Class: Interested in a waterbirth?  This  informational class will help you discover whether waterbirth is the right fit for you. Education about waterbirth itself, supplies you would need and how to assemble your support team is what you can  expect from this class. Some obstetrical practices require this class in order to pursue a waterbirth. (Not all obstetrical practices offer waterbirth-check with your healthcare provider.) Register only the expectant mom, but you are encouraged to bring your partner to class! Required if planning waterbirth, no fee. Infant/Child CPR: Parents, grandparents, babysitters, and friends learn Cardio-Pulmonary Resuscitation skills for infants and children. You will also learn how to treat both conscious and unconscious choking in infants and children. This Family & Friends program does not offer certification. Register each participant individually to ensure that enough mannequins are available. (Call for fee) Grandparent Love: Expecting a grandbaby? This class is for you! Learn about the latest infant care and safety recommendations and ways to support your own child as he or she transitions into the parenting role. Taught by Registered Nurses who are childbirth instructors, but most importantly...they are grandmothers too! $10/person. Childbirth Class- Natural Childbirth: This series of 5 weekly classes is for expectant parents who want to learn and practice natural methods of coping with the process of labor and childbirth. Relaxation, breathing, massage, visualization, role of the partner, and helpful positioning are highlighted. Participants learn how to be confident in their body's ability to give birth. This class will empower and help parents make informed decisions about their own care. Includes discussion that will help new parents transition into the immediate postpartum period. Maternity Care Center Tour of [facility] is included. We suggest taking this class between 25-32 weeks, but it's only a recommendation. $75 per registrant and one support person or $30 Medicaid. Childbirth Class- 3 week Series: This option of 3 weekly classes helps you and your labor partner prepare for childbirth. Newborn  care, labor & birth, cesarean birth, pain management, and comfort techniques are discussed and a Maternity Care Center Tour of [facility] is included. The class meets at the same time, on the same day of the week for 3 consecutive weeks beginning with the starting date you choose. $60 for registrant and one support person.  Marvelous Multiples: Expecting twins, triplets, or more? This class covers the differences in labor, birth, parenting, and breastfeeding issues that face multiples' parents. NICU tour is included. Led by a Certified Childbirth Educator who is the mother of twins. No fee. Caring for Baby: This class is for expectant and adoptive parents who want to learn and practice the most up-to-date newborn care for their babies. Focus is on birth through the first six weeks of life. Topics include feeding, bathing, diapering, crying, umbilical cord care, circumcision care and safe sleep. Parents learn to recognize symptoms of illness and when to call the pediatrician. Register only the mom-to-be and your partner or support person can plan to come with you! $10 per registrant and support person Childbirth Class- online option: This online class offers you the freedom to complete a Birth and Baby series in the comfort of your own home. The flexibility of this option allows you to review sections at your own pace, at times convenient to you and your support people. It includes additional video information, animations, quizzes, and extended activities. Get organized with helpful eClass tools, checklists, and trackers. Once you register online for the class, you will receive an email within a few days to accept the invitation and begin the class when the time   is right for you. The content will be available to you for 60 days. $60 for 60 days of online access for you and your support people.  Local Doulas: Natural Baby Doulas [email or web address] Tel:  [phone] [email or web address]/ [employer] [phone] [email or web address] The Labor Hassell Halim  (also do waterbirth tub rental) [phone] [email or web address] Triad Birth Doula [phone] [email or web address] NotebookDistributors.fi Sacred Rhythms  [phone] https://sacred-rhythms.com/ [employer] (PADA) [email or web address] https://www.frey.org/ [place] Birth and Baby  http://[email or web address]/ Considering Waterbirth? Guide for patients at [facility] [employer]  Why consider waterbirth?  . Gentle birth for babies . Less pain medicine used in labor . May allow for passive descent/less pushing . May reduce perineal tears  . More mobility and instinctive maternal position changes . Increased maternal relaxation . Reduced blood pressure in labor  Is waterbirth safe? What are the risks of infection, drowning or other complications?  . Infection: o Very low risk (3.7 % for tub vs 4.8% for bed) o 7 in 8000 waterbirths with documented infection o Poorly cleaned equipment most common cause o Slightly lower group B strep transmission rate  . Drowning o Maternal:  - Very low risk   - Related to seizures or fainting o Newborn:  - Very low risk. No evidence of increased risk of respiratory problems in multiple large studies - Physiological protection from breathing under water - Avoid underwater birth if there are any fetal complications - Once baby's head is out of the water, keep it out.  . Birth complication o Some reports of cord trauma, but risk decreased by bringing baby to surface gradually o No evidence of increased risk of shoulder dystocia. Mothers can usually change positions faster in water than in a bed, possibly aiding the maneuvers to free the shoulder.   You must attend a Doren Custard class at [facility]  3rd Wednesday of every month from 7-9pm  [employer] by calling [phone] or online at [email or web address]  Bring [place] the certificate from the class to your prenatal appointment  Meet with a midwife at 36 weeks to see if you can still plan a waterbirth and to sign the consent.   Purchase or rent the following supplies:   Water Birth Pool (Birth Pool in a Box or [place] for instance)  (Tubs start ~$125)  Single-use disposable tub liner designed for your brand of tub  New garden hose labeled "lead-free", "suitable for drinking water",  Electric drain pump to remove water (We recommend 792 gallon per hour or greater pump.)   Separate garden hose to remove the dirty water  Fish net  Bathing suit top (optional)  Long-handled mirror (optional)  Places to purchase or rent supplies  GotWebTools.is for tub purchases and supplies  Waterbirthsolutions.com for tub purchases and supplies  The Labor Ladies (www.thelaborladies.com) $275 for tub rental/set-up & take down/kit   [employer] (http://www.fleming.com/.htm) Information regarding doulas (labor support) who provide pool rentals  Our practice has a Birth Pool in a Box tub at the hospital that you may borrow on a first-come-first-served basis. It is your responsibility to to set up, clean and break down the tub. We cannot guarantee the availability of this tub in advance. You are responsible for bringing all accessories listed above. If you do not have all necessary supplies you cannot have a waterbirth.    Things that would prevent you from having a waterbirth:  Premature, <37wks  Previous cesarean birth  Presence of thick meconium-stained fluid  Multiple gestation (Twins,  triplets, etc.)  Uncontrolled diabetes or gestational diabetes requiring medication  Hypertension requiring medication or diagnosis of pre-eclampsia  Heavy vaginal bleeding  Non-reassuring fetal  heart rate  Active infection (MRSA, etc.). Group B Strep is NOT a contraindication for  waterbirth.  If your labor has to be induced and induction method requires continuous  monitoring of the baby's heart rate  Other risks/issues identified by your obstetrical provider  Please remember that birth is unpredictable. Under certain unforeseeable circumstances your provider may advise against giving birth in the tub. These decisions will be made on a case-by-case basis and with the safety of you and your baby as our highest priority.  AREA PEDIATRIC/FAMILY PRACTICE PHYSICIANS  [place] ([address]) .  [facility] o Chambliss, MD; Eniola, MD; Hale, MD; Hensel, MD; McDiarmid, MD; McIntyer, MD; Neal, MD; Walden, MD o [address] [place], [place] [address] o [phone] o Mon-Fri 8:30-12:30, 1:30-5:00 o Providers come to see babies at [facility] o Accepting Medicaid . [facility] o Limited providers who accept newborns: Koirala, MD; Morrow, MD; Wolters, MD o [address], [place], [place] [address] o [phone] o Mon-Fri 8:00-5:30 o Babies seen by providers at [facility] o Does NOT accept Medicaid o Please call early in hospitalization for appointment (limited availability)  . [facility] o Mulberry, MD o [address] [place], [place] [address] o [phone] o Mon, Tue, Thur, Fri 8:30-5:00, Wed 10:00-7:00 (closed 1-2pm) o Babies seen by [facility] providers o Accepting Medicaid . [facility] - Pediatrician o Rubin, MD o [address], [place], [place] [address] o [phone] o Mon-Fri 8:30-5:00, Sat 8:30-12:00 o Provider comes to see babies at [facility] o Accepting Medicaid o Must have been referred from current patients or contacted office prior to delivery . [facility] ([facility]) o Brown, MD; Chandler, MD; Ettefagh, MD; Grant, MD; Lester, MD; McCormick, MD; McQueen, MD; Prose, MD; Simha, MD; Stanley, MD; Stryffeler, NP; Tebben, NP o [address], [place], [place] [address] o [phone] o Mon, Tue, Thur, Fri 8:30-5:30, Wed 9:30-5:30, Sat 8:30-12:30 o Babies seen by [facility] providers o Accepting Medicaid o Only accepting infants of first-time parents or siblings of current patients o Hospital discharge coordinator will make follow-up appointment . Jack Amos o [address], [place], [place]  [address] o [phone]   Fax - [phone] . [facility] o [address], [place]  [address] o Phone - [phone]   Fax - [phone] . Shilpa Gosrani o [address], [place], [place]  [address] o [phone]  [place] ([address]) . [facility] o Bates, MD; [place], MD; Cooper, Cox, MD; MD; Davis, MD; Dovico, MD; Ettefaugh, MD; Little, MD; Lowe, MD; Keiffer, MD; Melvin, MD; Sumner, MD; Williams, MD o [address], [place], [place] [address] o [phone] o Mon-Fri 8:30-5:00 (extended evenings Mon-Thur as needed), Sat-Sun 10:00-1:00 o Providers come to see babies at [facility] o Accepting Medicaid for families of first-time babies and families with all children in the household age 3 and under. Must register with office prior to making appointment (M-F only). . [facility] o Henson, NP; Knapp, MD; Lalonde, MD; Tysinger, PA o [address] , [place] [address] o [phone] o Mon-Fri 8:00-5:00 o Babies seen by providers at [facility] o Does NOT accept Medicaid/Commercial   Insurance Only . [facility] ([facility])  o Artis, MD; Barnes, MD; Bratton, MD; Coccaro, MD; Lockett Gardner, MD; Kramer, MD; Marshall, MD; Netherton, MD; Poleto, MD; Skinner, MD o [address]  [place], [place] [address] o [phone] o Mon-Fri 8:30-5:30, Sat (Oct.-Mar.) 9:00-1:00 o Babies seen by providers at [facility] o Accepting Medicaid  [place] ([address]) . [facility] o Reid, MD; Warner, MD o [address], [place], [place] [address] o [phone] o Mon-Fri 8:30-5:00, Sat 8:30-12:00 o Providers come to see babies at [facility] o Does NOT accept Medicaid . [facility] o Becker, PA; Hagler, MD; Scifres, PA; Sun, MD; Swayne, MD o [address], [place], [place] [address] o [phone] o Mon-Fri 8:00-5:00 o Babies seen by providers at [facility] o Does NOT accept Medicaid o Only accepting babies of parents who are patients o Please call early in hospitalization for appointment (limited availability) . [facility] o Clark, MD; Frye, MD; Kelleher, MD; Mack, NP; Miller, MD; O'Keller, MD; Patterson, NP; Pudlo, MD; Puzio, MD; Thomas, MD; Tucker, MD; Twiselton, MD o [address], [place], [place] [address] o [phone] o Mon-Fri 8:00-5:00, Sat 9:00-12:00 o Providers come to see babies at [facility] o Does NOT accept Medicaid  [place] ([address]) . [facility] o Limited providers accepting new patients: Brake, NP; Wharton, PA o [address], [place], [place] [address] o [phone] o Mon-Fri 8:00-5:00 o Babies seen by providers at [facility] o Does NOT accept Medicaid o Only accepting babies of parents who are patients o Please call early in hospitalization for appointment (limited availability) . [facility] o Gay, MD; Quinlan, MD o [address] [place], [place] [address] o [phone] (press 1 to schedule appointment) o Mon-Fri 8:00-5:00 o Providers come to see babies at [facility] o Does NOT accept Medicaid . [facility] o Mazer, MD o [address] [place], [place]  [address] o [phone] o Mon-Fri 8:30-5:00 (lunch 12:30-1:00), extended hours by appointment only Wed 5:00-6:30 o Babies seen by [facility] providers o Accepting Medicaid . [facility] o Banks, MD; Jordan, MD; Koberlein, MD o [address], [place], [place] [address] o [phone] o Mon-Fri 8:00-5:00 o Babies seen by [facility] providers o Does NOT accept Medicaid . [facility] o [place], MD; Hunter, MD; Wallace, DO o [address] [place], [place] [address] o [phone] o Mon-Fri 8:00-5:00 o Babies seen by [facility] providers o Does NOT accept Medicaid . [facility] o Brandon, PA; Brecken, PA; Christy, NP; Dees, MD; DeClaire, MD; DeWeese, MD; Hansen, NP; Mills, NP; Parrish, NP; Smoot, NP; Summer, MD; Vapne, MD o [address] [place], [place] [address] o [phone] o Mon-Fri 8:30-5:00, Sat 10:00-1:00 o Providers come to see babies at [facility] o Does NOT accept Medicaid o Free prenatal information session Tuesdays at 4:45pm . [facility] o Bouska, MD; Gordon, PA; Jeffery, PA; Weber, PA o [address] [place] [address] o [phone] o Mon-Fri 7:30-5:30 o Babies seen by [facility] providers . [facility] o [address], [place], [place]  [address] o [phone]   Fax - [phone]  [place] ([address]) . [facility] o [place], [place] o [address] , [place] [address] o [phone] o Mon-Thur 8:00-6:00   o Providers come to see babies at [facility] o Accepting Medicaid . [facility] o Anderson, NP; Badger, MD; Beal, PA; Spencer, PA o [address] [place], [place] [address] o [phone] o Mon-Thur 7:30-7:30, Fri 7:30-4:30 o Babies seen by [facility] providers o Accepting Medicaid . [facility] o Agbuya, MD; Klett,  NP; Romgoolam, MD o [address], [place], [place] [address] o [phone] o Mon-Fri 8:30-5:00, Sat 8:30-12:00 o Providers come to see babies at [facility] o Accepting Medicaid o Must have "Meet & Greet" appointment at office prior to delivery . [facility] ([facility] o McCord, MD; Wallace, MD; Wood, MD o [address], [place], [place] [address] o [phone] o Mon-Wed 8:00-6:00, Thur-Fri 8:00-5:00, Sat 9:00-12:00 o Providers come to see babies at [facility] o Does NOT accept Medicaid o Only accepting siblings of current patients . [facility]  o [address], [place], [place]  [address] o [phone]   Fax - [phone] . [facility] o [address], [place], [place]  [address] o [phone]   Fax - [phone]  [place] ([address] & [address]) . [facility] o Cirigliano, DO; Matthews, DO o [address] [place], [place] [address] o [phone] o Mon-Fri 7:00-5:00 o Babies seen by [facility] providers o Does NOT accept Medicaid . [facility] o Briscoe, MD; Howley, PA; Moreira, PA o [address], [place], [place] [address] o [phone] o Mon-Fri 8:00-5:00 o Babies seen by [facility] providers o Accepting Medicaid . [facility] o Boyd, MD; Church, PA; Jones, NP; Osborn, PA o [address], [place], [place] [address] o [phone] o Mon-Fri 8:00-5:00 o Babies seen by providers at [facility] o Accepting Medicaid  [place] ([address]) . [facility] [place] o Wendling, DO o [address] [place], [place] [address] o [phone] o Mon-Fri 8:00-5:00 o Babies seen by [facility] providers o Does NOT accept  Medicaid o Limited availability, please call early in hospitalization to schedule follow-up . [facility] o Calderon, PA; Cummings, MD; Dillard, MD; Martin, PA; Olson, MD; VanDeven, PA o [address], [place], [place] [address] o [phone] o Mon-Fri 8:30-5:00, Sat 9:00-12:00 o Babies seen by providers at [facility] o Accepting Medicaid o Please register online then schedule online or call office o [email or web address] . [facility] ([facility] o Hunter, NP; Kumar, MD; Martin Rogers, PA o [address], [place], [place] [address] o [phone] o Mon-Fri 8:00-5:00 o Babies seen by providers at [facility] o Accepting Medicaid . [facility] ([facility] o [place]wood, MD; Kristi Fleenor, NP; West, MD o [address], [place], [place] [address] o [phone] o Mon-Fri 8:00-5:30, Sat&Sun by appointment (phones open at 8:30) o Babies seen by [facility] providers o Accepting Medicaid o Must be a first-time baby or sibling of current patient . [facility] [place]  o [address], [place],   [address] o [phone]   Fax - [phone]  [place] ([address]) . [facility] o Brown, PA; Cowen, PA; Rice, MD; Helton, PA; Spry, MD o [address]   [place], [place] [address] o [phone] o Mon-Thur 8:00-7:00, Fri 8:00-5:00, Sat 8:00-12:00, Sun 9:00-12:00 o Babies seen by [facility] providers o Accepting Medicaid . [facility] o Coe-Goins, MD; Marshall, MD; Pierre-Louis, MD o [address] [place] [address], [place], [place] [address] o [phone] o Mon-Thur 8:00-5:00 o Babies seen by providers at [facility] o Accepting Medicaid . [facility] o Bratton, MD; Coe-Goins, MD; Hayes,  MD; Lewis, MD; List, MD; Lott, MD; Marshall, MD; Moran, MD; O'Neal, MD; Pierre-Louis, MD; Pitonzo, MD; Scholer, MD; Spangle, MD o [address] [place], [place] [address] o [phone] o Mon-Fri 8:00-5:30, Sat (Oct.-Mar.) 9:00-1:00 o Babies seen by providers at [facility] o Accepting Medicaid o Must fill out new patient packet, available online at [email or web address] . [facility] ([facility]) o Friddle, NP; Harris, NP; Kelly, NP; Logan, MD; Melvin, PA; Poth, MD; Ramadoss, MD; Stanton, NP o [address], [place], [place] [address] o [phone] o Mon-Thur 8:00-5:30, Fri 8:00-5:00 o Babies seen by providers at [facility] o Accepting Medicaid  [place] ([address]) . [facility] o Dixon, PA; [place], MD; Pickard, MD; Tapia, PA o [address], [place], [place] [id number] o [phone] o Mon-Fri 8:00-5:00 o Babies seen by providers at [facility] o Accepting Medicaid   [place] ([address]) . [facility] [place] o Masneri, DO; Meyers, MD; Nelson, PA o [address], [place], [place] [address] o [phone] o Mon-Fri 8:00-5:00 o Babies seen by providers at [facility] o Does NOT accept Medicaid o Limited appointment availability, please call early in hospitalization  . [facility] [place] o Kunedd, DO; McGowen, MD o [address], [place], [place] [address] o [phone] o Mon-Fri 8:00-5:00 o Babies seen by [facility] providers o Does NOT accept Medicaid . [facility] [place] o Cameron, MD; MacDonald, MD; Michaels, PA; Nayak, MD o [address] [place] [address], [place], [place] [address] o [phone] o Mon-Fri 8:00-5:00 o After hours clinic ([address] [place], [place] [address]) [phone] Mon-Fri 5:00-8:00, Sat 12:00-6:00, Sun 10:00-4:00 o Babies seen by [facility]  providers o Accepting Medicaid . [facility] [place] o [address], [place], [place]  [address] o [phone]   Fax - [phone]  [place] ([address]) . [facility] [place] o Andy, MD o [address], [place], [place] [address] o [phone] o Mon-Fri 8:00-5:00 o Babies seen by [facility] providers o Does NOT accept Medicaid . [facility] [place] ([facility] [place]) o Eksir, MD o [address], [place], [place] [address] o [phone] o Mon-Thur 8:00-7:00, Fri 8:00-5:00, Sat 8:00-12:00 o Babies seen by providers at [facility] o Accepting Medicaid - but does not have vaccinations in office (must be received elsewhere) o Limited availability, please call early in hospitalization  [place] ([address]) . [facility]  o Charlene Flemming, MD o [address], [place] [address] o [phone]  Fax [phone]  Safe Medications in Pregnancy   Acne:  Benzoyl Peroxide  Salicylic Acid   Backache/Headache:  Tylenol: 2 regular strength every 4 hours OR        2 Extra strength every 6 hours   Colds/Coughs/Allergies:  Benadryl (alcohol free) 25 mg   every 6 hours as needed  Breath right strips  Claritin  Cepacol throat lozenges  Chloraseptic throat spray  Cold-Eeze- up to three times per day  Cough drops, alcohol free  Flonase (by prescription only)  Guaifenesin  Mucinex  Robitussin DM (plain only, alcohol free)  Saline nasal spray/drops  Sudafed (pseudoephedrine) & Actifed * use only after [redacted] weeks gestation and if you do not have high blood pressure  Tylenol  Vicks Vaporub  Zinc lozenges  Zyrtec   Constipation:  Colace  Ducolax suppositories  Fleet enema  Glycerin suppositories  Metamucil  Milk of magnesia  Miralax  Senokot  Smooth move tea   Diarrhea:  Kaopectate  Imodium A-D   *NO pepto Bismol   Hemorrhoids:  Anusol   Anusol HC  Preparation H  Tucks   Indigestion:  Tums  Maalox  Mylanta  Zantac  Pepcid   Insomnia:  Benadryl (alcohol free) 25mg every 6 hours as needed  Tylenol PM  Unisom, no Gelcaps   Leg Cramps:  Tums  MagGel   Nausea/Vomiting:  Bonine  Dramamine  Emetrol  Ginger extract  Sea bands  Meclizine  Nausea medication to take during pregnancy:  Unisom (doxylamine succinate 25 mg tablets) Take one tablet daily at bedtime. If symptoms are not adequately controlled, the dose can be increased to a maximum recommended dose of two tablets daily (1/2 tablet in the morning, 1/2 tablet mid-afternoon and one at bedtime).  Vitamin B6 100mg tablets. Take one tablet twice a day (up to 200 mg per day).   Skin Rashes:  Aveeno products  Benadryl cream or 25mg every 6 hours as needed  Calamine Lotion  1% cortisone cream   Yeast infection:  Gyne-lotrimin 7  Monistat 7    **If taking multiple medications, please check labels to avoid duplicating the same active ingredients  **take medication as directed on the label  ** Do not exceed 4000 mg of tylenol in 24 hours  **Do not take medications that contain aspirin or ibuprofen          

## 2018-09-15 NOTE — BH Specialist Note (Signed)
Integrated Behavioral Health Initial Visit  MRN: [id number] Name: JOYCEANN KIRALY  Number of Integrated Behavioral Health Clinician visits:: 1/6 Session Start time: 4:21  Session End time: 4:31 Total time: 15 minutes  Type of Service: Integrated Behavioral Health- Individual/Family Interpretor:No. Interpretor Name and Language: n/a   Warm Hand Off Completed.       SUBJECTIVE: DALYA LANDRO is a 23 y.o. female accompanied by n/a Patient was referred by Vonzella Nipple, PA-C for Initial OB introduction to integrated behavioral health services . Patient reports the following symptoms/concerns: Pt states a history of depression, currently treated with Wellbutrin; pt used to use Hydroxyzine for anxiety, but is not currently taking. Pt attributes increase in anxiety to current work stress (related to sickness in early pregnancy) but feels she is managing well.  Duration of problem: Current pregnancy; Severity of problem: mild  OBJECTIVE: Mood: Normal and Affect: Appropriate Risk of harm to self or others: No plan to harm self or others  LIFE CONTEXT: Family and Social: - School/Work: Pt works two jobs  Self-Care: - Life Changes: Current pregnancy  GOALS ADDRESSED: Patient will: 1. Reduce symptoms of: anxiety 2. Increase knowledge and/or ability of: healthy habits   INTERVENTIONS: Interventions utilized: Psychoeducation and/or Health Education  Standardized Assessments completed: Not given today  ASSESSMENT: Patient currently experiencing Supervision of normal pregnancy, antepartum   Patient may benefit from Initial OB introduction to integrated behavioral health services, along with psychoeducation regarding coping with anxiety. Marland Kitchen  PLAN: 1. Follow up with behavioral health clinician on : As needed 2. Behavioral recommendations:  -Continue taking BH medication, as prescribed -Continue taking prenatal vitamin, as recommended by medical provider -Read educational materials  regarding coping with symptoms of anxiety  3. Referral(s): Integrated [employer] (In Clinic)  [place], [place]

## 2018-09-15 NOTE — Progress Notes (Signed)
Medicaid Home Form Completed-09/15/18

## 2018-09-15 NOTE — Progress Notes (Signed)
   PRENATAL VISIT NOTE  Subjective:  Lori Weaver is a 23 y.o. G1P0 at [redacted]w[redacted]d being seen today for first prenatal care visit .  She is currently monitored for the following issues for this low-risk pregnancy and has Family history of lupus erythematosus; Depression; Acne vulgaris; Irritant contact dermatitis; Supervision of other normal pregnancy, antepartum; and Anxiety on their problem list.  Patient reports no complaints.  Contractions: Not present. Vag. Bleeding: None.  Movement: Present. Denies leaking of fluid.   The following portions of the patient's history were reviewed and updated as appropriate: allergies, current medications, past family history, past medical history, past social history, past surgical history and problem list. Problem list updated.  Objective:   Vitals:   09/15/18 1513  BP: (!) 123/57  Pulse: 87  Weight: 108 lb 14.4 oz (49.4 kg)    Fetal Status: Fetal Heart Rate (bpm): 161   Movement: Present     General:  Alert, oriented and cooperative. Patient is in no acute distress.  Skin: Skin is warm and dry. No rash noted.   Cardiovascular: Normal heart rate and rhythm noted  Respiratory: Normal respiratory effort, no problems with respiration noted clear to auscultation.   Abdomen: Soft, gravid, appropriate for gestational age. Normal bowel sounds. Non-tender.  Pain/Pressure: Absent     Pelvic: Cervical exam performed Dilation: Closed Effacement (%): Thick    Extremities: Normal range of motion.  Edema: None  Mental Status: Normal mood and affect. Normal behavior. Normal judgment and thought content.   Assessment and Plan:  Pregnancy: G1P0 at [redacted]w[redacted]d  1. Supervision of other normal pregnancy, antepartum - [facility] AMB BABYSCRIPTS OPT IN - Inheritest(R) CF/SMA Panel - Culture, OB Urine - Hemoglobinopathy Evaluation - Obstetric Panel, Including HIV - SMN1 COPY NUMBER ANALYSIS (SMA Carrier Screen) - [place] MFM OB COMP + 14 WK; Future - Cytology - PAP( [place]) -  Cervicovaginal ancillary only( [place]) - Genetic Screening  2. Current moderate episode of major depressive disorder, unspecified whether recurrent (HCC) - Ambulatory referral to Integrated Behavioral Health  3. Anxiety - Ambulatory referral to Integrated Behavioral Health  Preterm labor/first trimester warning symptoms and general obstetric precautions including but not limited to vaginal bleeding, contractions, leaking of fluid and fetal movement were reviewed in detail with the patient. Please refer to After Visit Summary for other counseling recommendations.  Return in about 4 weeks (around 10/13/2018) for LOB with midwife. Interested in [employer].   Future Appointments  Date Time Provider Department Center  11/11/2018 10:15 AM [facility]-M[facility] [place] 4 WH-M[facility]US M[facility]-US    Vonzella Nipple, PA-C

## 2018-09-16 LAB — CERVICOVAGINAL ANCILLARY ONLY
Bacterial vaginitis: NEGATIVE
Candida vaginitis: NEGATIVE

## 2018-09-17 LAB — CULTURE, OB URINE

## 2018-09-17 LAB — URINE CULTURE, OB REFLEX: Organism ID, Bacteria: NO GROWTH

## 2018-09-17 LAB — CYTOLOGY - PAP
Chlamydia: NEGATIVE
Diagnosis: NEGATIVE
Neisseria Gonorrhea: NEGATIVE
Trichomonas: NEGATIVE

## 2018-09-24 LAB — HEMOGLOBINOPATHY EVALUATION
Ferritin: 155 ng/mL — ABNORMAL HIGH (ref 15–150)
Hgb A2 Quant: 2.1 % (ref 1.8–3.2)
Hgb A: 97.9 % (ref 96.4–98.8)
Hgb C: 0 %
Hgb F Quant: 0 % (ref 0.0–2.0)
Hgb S: 0 %
Hgb Solubility: NEGATIVE
Hgb Variant: 0 %

## 2018-09-24 LAB — OBSTETRIC PANEL, INCLUDING HIV
Antibody Screen: NEGATIVE
Basophils Absolute: 0.1 10*3/uL (ref 0.0–0.2)
Basos: 1 %
EOS (ABSOLUTE): 0 10*3/uL (ref 0.0–0.4)
Eos: 1 %
HIV Screen 4th Generation wRfx: NONREACTIVE
Hematocrit: 30.6 % — ABNORMAL LOW (ref 34.0–46.6)
Hemoglobin: 10.4 g/dL — ABNORMAL LOW (ref 11.1–15.9)
Hepatitis B Surface Ag: NEGATIVE
IMMATURE GRANS (ABS): 0 10*3/uL (ref 0.0–0.1)
Immature Granulocytes: 0 %
LYMPHS: 23 %
Lymphocytes Absolute: 1.5 10*3/uL (ref 0.7–3.1)
MCH: 29.4 pg (ref 26.6–33.0)
MCHC: 34 g/dL (ref 31.5–35.7)
MCV: 86 fL (ref 79–97)
MONOCYTES: 9 %
Monocytes Absolute: 0.6 10*3/uL (ref 0.1–0.9)
Neutrophils Absolute: 4.2 10*3/uL (ref 1.4–7.0)
Neutrophils: 66 %
Platelets: 360 10*3/uL (ref 150–450)
RBC: 3.54 x10E6/uL — ABNORMAL LOW (ref 3.77–5.28)
RDW: 12.2 % (ref 11.7–15.4)
RPR Ser Ql: NONREACTIVE
Rh Factor: POSITIVE
Rubella Antibodies, IGG: 7.53 index (ref >0.99)
WBC: 6.4 10*3/uL (ref 3.4–10.8)

## 2018-09-24 LAB — INHERITEST(R) CF/SMA PANEL

## 2018-10-05 ENCOUNTER — Encounter: Payer: Self-pay | Admitting: *Deleted

## 2018-10-11 ENCOUNTER — Encounter: Payer: Self-pay | Admitting: Family Medicine

## 2018-10-12 ENCOUNTER — Encounter: Payer: Self-pay | Admitting: *Deleted

## 2018-10-13 ENCOUNTER — Encounter: Payer: Self-pay | Admitting: Nurse Practitioner

## 2018-10-13 ENCOUNTER — Ambulatory Visit (INDEPENDENT_AMBULATORY_CARE_PROVIDER_SITE_OTHER): Payer: Medicaid Other | Admitting: Nurse Practitioner

## 2018-10-13 ENCOUNTER — Other Ambulatory Visit: Payer: Self-pay

## 2018-10-13 ENCOUNTER — Ambulatory Visit (INDEPENDENT_AMBULATORY_CARE_PROVIDER_SITE_OTHER): Payer: Medicare Other | Admitting: Clinical

## 2018-10-13 VITALS — BP 125/57 | HR 99 | Wt 111.4 lb

## 2018-10-13 DIAGNOSIS — Z3A14 14 weeks gestation of pregnancy: Secondary | ICD-10-CM

## 2018-10-13 DIAGNOSIS — Z3480 Encounter for supervision of other normal pregnancy, unspecified trimester: Secondary | ICD-10-CM

## 2018-10-13 DIAGNOSIS — Z3482 Encounter for supervision of other normal pregnancy, second trimester: Secondary | ICD-10-CM

## 2018-10-13 MED ORDER — BUPROPION HCL ER (XL) 150 MG PO TB24: 150.0000 mg | ORAL_TABLET | Freq: Every day | ORAL | 2 refills | Status: DC

## 2018-10-13 NOTE — Progress Notes (Signed)
    Subjective:  Lori Weaver is a 23 y.o. G1P0 at [redacted]w[redacted]d being seen today for ongoing prenatal care.  She is currently monitored for the following issues for this low-risk pregnancy and has Family history of lupus erythematosus; Depression; Acne vulgaris; Irritant contact dermatitis; Supervision of other normal pregnancy, antepartum; and Anxiety on their problem list.  Seen by [facility] provider in the office today.  Recommended enrollment in [facility] as client has some concerns about obtaining food.  Patient reports she needs a refill of her medication - Wellbutrin and is not using any medications for anxiety at this point..  Contractions: Not present. Vag. Bleeding: None.  Movement: Present. Denies leaking of fluid.   The following portions of the patient's history were reviewed and updated as appropriate: allergies, current medications, past family history, past medical history, past social history, past surgical history and problem list. Problem list updated.  Objective:   Vitals:   10/13/18 0937  BP: (!) 125/57  Pulse: 99  Weight: 111 lb 6.4 oz (50.5 kg)    Fetal Status: Fetal Heart Rate (bpm): 147   Movement: Present     General:  Alert, oriented and cooperative. Patient is in no acute distress.  Skin: Skin is warm and dry. No rash noted.   Cardiovascular: Normal heart rate noted  Respiratory: Normal respiratory effort, no problems with respiration noted  Abdomen: Soft, gravid, appropriate for gestational age. Pain/Pressure: Present     Pelvic:  Cervical exam deferred        Extremities: Normal range of motion.  Edema: None  Mental Status: Normal mood and affect. Normal behavior. Normal judgment and thought content.   Urinalysis:      Assessment and Plan:  Pregnancy: G1P0 at [redacted]w[redacted]d  1. Supervision of other normal pregnancy, antepartum Nausea has improved.  Eats regular meals and snacks. Reviewed MyChart use and how Babyscripts works.  Advised to enter BP weekly. Reviewed safe medical  care with client.  Is using Covid 19 public safety advice for social distancing. Wants a letter for her dentist but advised that a cleaning appointment should be delayed at this time at least for 2 months. Instructed to check BP weekly beginning this week when the BP cuff arrives and record with Babyscripts.  Send a message to the office if she is having any problems with getting it started. Advised childbirth classes and breastfeeding classes later in this pregnancy. Is enrolled in waterbirth class in July.  - [facility] AMB BABYSCRIPTS OPT IN  2.  Depression Given refill for Wellbutrin XL which she has been taking through the pregnancy and is stable.  Will continue on this medication as she is now 14 weeks - refills given.  Preterm labor symptoms and general obstetric precautions including but not limited to vaginal bleeding, contractions, leaking of fluid and fetal movement were reviewed in detail with the patient. Please refer to After Visit Summary for other counseling recommendations.  Return in about 5 weeks (around 11/18/2018) for Lab only 11-11-18- AFP;  11-18-18 for ROB clinic visit.  Nolene Bernheim, RN, MSN, NP-BC Nurse Practitioner, [facility] for [employer], [facility] 10/13/2018 10:23 AM

## 2018-10-13 NOTE — BH Specialist Note (Signed)
  Integrated Behavioral Health Follow Up Visit  MRN: [id number] Name: Lori Weaver  Number of Integrated Behavioral Health Clinician visits: 2/6 Session Start time: 9:52  Session End time: 10:02 Total time: 15 minutes  Type of Service: Integrated Behavioral Health- Individual/Family Interpretor:No. Interpretor Name and Language: n/a  SUBJECTIVE: Lori Weaver is a 23 y.o. female accompanied by n/a Patient was referred by Nolene Bernheim, NP for history of anxiety and depression. Patient reports the following symptoms/concerns: Pt states her primary concern today is food availability during COVID-19, she does not qualify for EBT, and is unsure where to apply for [facility]. Pt feels she is coping well with depression and anxiety, and still taking her BH medication as prescribed.  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  Maintain reduction of symptoms of: anxiety and depression   INTERVENTIONS: Interventions utilized:  Supportive Counseling Standardized Assessments completed: GAD-7 and PHQ 9  ASSESSMENT: Patient currently experiencing Supervision of other normal pregnancy, antepartum  Patient may benefit from link to community resources today.  PLAN: 1. Follow up with behavioral health clinician on : As needed 2. Behavioral recommendations:  -Continue taking BH medication as prescribed -Call [facility] office, and follow instructions to apply for [facility] vouchers  3. Referral(s): Community Resources:  Food 4. "From scale of 1-10, how likely are you to follow plan?": 10  Rae Lips, LCSW  Depression screen [facility] 2/9 10/13/2018 05/21/2018 04/06/2017  Decreased Interest 0  0 0  Down, Depressed, Hopeless 1 1 0  PHQ - 2 Score 1 1 0  Altered sleeping 1 - 0  Tired, decreased energy 1 - 1  Change in appetite 0 - 2  Feeling bad or failure about yourself  0 - 0  Trouble concentrating 0 - 0  Moving slowly or fidgety/restless 0 - 0  Suicidal thoughts 0 - 0  PHQ-9 Score 3 - 3   GAD 7 : Generalized Anxiety Score 10/13/2018 04/06/2017  Nervous, Anxious, on Edge 1 0  Control/stop worrying 0 0  Worry too much - different things 0 0  Trouble relaxing 0 0  Restless 0 0  Easily annoyed or irritable 1 1  Afraid - awful might happen 0 0  Total GAD 7 Score 2 1

## 2018-10-14 ENCOUNTER — Telehealth: Payer: Self-pay | Admitting: Obstetrics and Gynecology

## 2018-10-14 NOTE — Telephone Encounter (Signed)
The patient called about the blood pressure cuffs email. Stated she never received it. I reconfirmed her email is [email or web address]. The patient stated she also checked her spam folder.

## 2018-10-15 NOTE — Telephone Encounter (Signed)
Pt called because she never received the e-mail for baby scripts and she needs to have it completed, I verified the patients e-mail address and made sure she connected the e-mail and checked all spam folders. Pt states she  Didn't have anything. Advised patient to drive through on Monday to pick up a cuff and to speak with carrie to help her get optimized correctly. Had patient check spam and trash from 02/26 because it was initially sent then, pt will now go online and create a account and I advised to put in our address for the mommy kit and to not to forget to come by here to pick up the bp cuff and to get it paired before she leaves.

## 2018-10-18 ENCOUNTER — Other Ambulatory Visit: Payer: Self-pay

## 2018-10-19 ENCOUNTER — Encounter (HOSPITAL_COMMUNITY): Payer: Self-pay

## 2018-10-19 ENCOUNTER — Inpatient Hospital Stay (HOSPITAL_COMMUNITY)
Admission: AD | Admit: 2018-10-19 | Discharge: 2018-10-19 | Disposition: A | Discharge status: Home / Self Care | Payer: Medicaid Other | Attending: Obstetrics & Gynecology | Admitting: Obstetrics & Gynecology

## 2018-10-19 ENCOUNTER — Other Ambulatory Visit: Payer: Self-pay

## 2018-10-19 ENCOUNTER — Encounter: Payer: Medicaid Other | Admitting: Family Medicine

## 2018-10-19 DIAGNOSIS — Z818 Family history of other mental and behavioral disorders: Secondary | ICD-10-CM | POA: Diagnosis not present

## 2018-10-19 DIAGNOSIS — O9989 Other specified diseases and conditions complicating pregnancy, childbirth and the puerperium: Secondary | ICD-10-CM | POA: Insufficient documentation

## 2018-10-19 DIAGNOSIS — Z91013 Allergy to seafood: Secondary | ICD-10-CM | POA: Insufficient documentation

## 2018-10-19 DIAGNOSIS — R1030 Lower abdominal pain, unspecified: Secondary | ICD-10-CM

## 2018-10-19 DIAGNOSIS — Z91048 Other nonmedicinal substance allergy status: Secondary | ICD-10-CM | POA: Insufficient documentation

## 2018-10-19 DIAGNOSIS — Z3A15 15 weeks gestation of pregnancy: Secondary | ICD-10-CM | POA: Diagnosis not present

## 2018-10-19 DIAGNOSIS — R102 Pelvic and perineal pain: Secondary | ICD-10-CM | POA: Insufficient documentation

## 2018-10-19 DIAGNOSIS — O26892 Other specified pregnancy related conditions, second trimester: Secondary | ICD-10-CM | POA: Diagnosis not present

## 2018-10-19 DIAGNOSIS — F419 Anxiety disorder, unspecified: Secondary | ICD-10-CM | POA: Insufficient documentation

## 2018-10-19 DIAGNOSIS — O99342 Other mental disorders complicating pregnancy, second trimester: Secondary | ICD-10-CM | POA: Insufficient documentation

## 2018-10-19 DIAGNOSIS — N949 Unspecified condition associated with female genital organs and menstrual cycle: Secondary | ICD-10-CM

## 2018-10-19 LAB — URINALYSIS, ROUTINE W REFLEX MICROSCOPIC
Bilirubin Urine: NEGATIVE
Glucose, UA: NEGATIVE mg/dL
Hgb urine dipstick: NEGATIVE
Ketones, ur: NEGATIVE mg/dL
LEUKOCYTE UA: NEGATIVE
Nitrite: NEGATIVE
Protein, ur: NEGATIVE mg/dL
Specific Gravity, Urine: 1.014 (ref 1.005–1.030)
pH: 7 (ref 5.0–8.0)

## 2018-10-19 LAB — WET PREP, GENITAL
CLUE CELLS WET PREP: NONE SEEN
SPERM: NONE SEEN
Trich, Wet Prep: NONE SEEN
Yeast Wet Prep HPF POC: NONE SEEN

## 2018-10-19 MED ORDER — ACETAMINOPHEN 500 MG PO TABS
1000.0000 mg | ORAL_TABLET | Freq: Once | ORAL | Status: AC
  Administered 2018-10-19: 1000 mg via ORAL
  Filled 2018-10-19: qty 2

## 2018-10-19 NOTE — Discharge Instructions (Signed)

## 2018-10-19 NOTE — MAU Provider Note (Signed)
History     CSN: [id number]  Arrival date and time: 10/19/18 1656   First Provider Initiated Contact with Patient 10/19/18 1749      Chief Complaint  Patient presents with  . Abdominal Pain  . Pelvic Pain   Lori Weaver 23 y.o. G1P0 at [redacted]w[redacted]d who presents today with lower abdominal pain. She reports that the pain has been getting better. She has not taken anything for the pain. She denies any VB.   Abdominal Pain  Pertinent negatives include no constipation (last BM 10/19/18 ), diarrhea, dysuria, fever, frequency, nausea or vomiting.  Pelvic Pain  The patient's primary symptoms include pelvic pain. The patient's pertinent negatives include no vaginal discharge. This is a new problem. The current episode started today. The problem occurs intermittently. The problem has been unchanged. Pain severity now: 7/10. The problem affects both sides. She is pregnant. Associated symptoms include abdominal pain. Pertinent negatives include no chills, constipation (last BM 10/19/18 ), diarrhea, dysuria, fever, frequency, nausea or vomiting. The symptoms are aggravated by tactile pressure. She has tried nothing for the symptoms. She uses nothing for contraception.    OB History    Gravida  1   Para      Term      Preterm      AB      Living        SAB      TAB      Ectopic      Multiple      Live Births              Past Medical History:  Diagnosis Date  . Allergy   . Anemia   . Anxiety   . Chlamydia   . Depression   . Dysmenorrhea   . Gonorrhea     Past Surgical History:  Procedure Laterality Date  . LAPAROSCOPY N/A 05/06/2017   Procedure: LAPAROSCOPY DIAGNOSTIC;  Surgeon: Tereso Newcomer, MD;  Location: [facility];  Service: Gynecology;  Laterality: N/A;  . LAPAROSCOPY ABDOMEN DIAGNOSTIC    . TONSILLECTOMY AND ADENOIDECTOMY    . TYMPANOSTOMY TUBE PLACEMENT Bilateral   . WISDOM TOOTH EXTRACTION      Family History  Problem Relation Age of Onset   . Lupus Mother   . Depression Mother   . Anxiety disorder Mother   . Lupus Maternal Grandmother     Social History   Tobacco Use  . Smoking status: Never Smoker  . Smokeless tobacco: Never Used  Substance Use Topics  . Alcohol use: Not Currently  . Drug use: Not Currently    Types: Marijuana    Allergies:  Allergies  Allergen Reactions  . Fish Allergy Anaphylaxis  . Soap Rash    Use for Pre-op    Medications Prior to Admission  Medication Sig Dispense Refill Last Dose  . buPROPion (WELLBUTRIN XL) 150 MG 24 hr tablet Take 1 tablet (150 mg total) by mouth daily. 30 tablet 2 10/19/2018 at Unknown time  . Prenatal Vit-Fe Fumarate-FA (PRENATAL MULTIVITAMIN) TABS tablet Take 1 tablet by mouth daily at 12 noon.   10/18/2018 at Unknown time    Review of Systems  Constitutional: Negative for chills and fever.  Gastrointestinal: Positive for abdominal pain. Negative for constipation (last BM 10/19/18 ), diarrhea, nausea and vomiting.  Genitourinary: Positive for pelvic pain. Negative for dysuria, frequency, vaginal bleeding and vaginal discharge.   Physical Exam   Blood pressure (!) 108/56, pulse 72, temperature 97.7  F (36.5 C), resp. rate 16, weight 50.1 kg, last menstrual period 07/01/2018, SpO2 100 %.  Physical Exam  Nursing note and vitals reviewed. Constitutional: She is oriented to person, place, and time. She appears well-developed and well-nourished. No distress.  HENT:  Head: Normocephalic.  Cardiovascular: Normal rate.  Respiratory: Effort normal.  GI: Soft. There is no abdominal tenderness. There is no rebound.  Genitourinary:    Genitourinary Comments:  External: no lesion Vagina: small amount of white discharge Cervix: pink, smooth, no CMT, closed/thick  Uterus: AGA, FHT 150 with doppler     Neurological: She is alert and oriented to person, place, and time.  Skin: Skin is warm and dry.  Psychiatric: She has a normal mood and affect.   Results for orders  placed or performed during the hospital encounter of 10/19/18 (from the past 24 hour(s))  Urinalysis, Routine w reflex microscopic     Status: Abnormal   Collection Time: 10/19/18  5:25 PM  Result Value Ref Range   Color, Urine YELLOW YELLOW   APPearance CLOUDY (A) CLEAR   Specific Gravity, Urine 1.014 1.005 - 1.030   pH 7.0 5.0 - 8.0   Glucose, UA NEGATIVE NEGATIVE mg/dL   Hgb urine dipstick NEGATIVE NEGATIVE   Bilirubin Urine NEGATIVE NEGATIVE   Ketones, ur NEGATIVE NEGATIVE mg/dL   Protein, ur NEGATIVE NEGATIVE mg/dL   Nitrite NEGATIVE NEGATIVE   Leukocytes,Ua NEGATIVE NEGATIVE  Wet prep, genital     Status: Abnormal   Collection Time: 10/19/18  6:03 PM  Result Value Ref Range   Yeast Wet Prep HPF POC NONE SEEN NONE SEEN   Trich, Wet Prep NONE SEEN NONE SEEN   Clue Cells Wet Prep HPF POC NONE SEEN NONE SEEN   WBC, Wet Prep HPF POC MANY (A) NONE SEEN   Sperm NONE SEEN     MAU Course  Procedures  MDM Patient has had tylenol for pain. She reports that her pain has improved.   Assessment and Plan   1. Round ligament pain   2. [redacted] weeks gestation of pregnancy    DC home Comfort measures reviewed  1st/2nd/3rd Trimester precautions  Bleeding precautions Ectopic precautions PTL precautions  Fetal kick counts RX: none Return to MAU as needed FU with OB as planned  Follow-up Information    Center for [facility] Follow up.   Specialty:  Obstetrics and Gynecology Contact information: [address] [place] [address] [phone]

## 2018-10-20 LAB — GC/CHLAMYDIA PROBE AMP (~~LOC~~) NOT AT ARMC
CHLAMYDIA, DNA PROBE: NEGATIVE
Neisseria Gonorrhea: NEGATIVE

## 2018-10-21 LAB — CULTURE, OB URINE: CULTURE: NO GROWTH

## 2018-11-02 ENCOUNTER — Encounter (HOSPITAL_COMMUNITY): Payer: Self-pay

## 2018-11-02 ENCOUNTER — Other Ambulatory Visit: Payer: Self-pay

## 2018-11-02 ENCOUNTER — Inpatient Hospital Stay (HOSPITAL_COMMUNITY)
Admission: AD | Admit: 2018-11-02 | Discharge: 2018-11-02 | Disposition: A | Discharge status: Home / Self Care | Payer: Medicaid Other | Source: Ambulatory Visit | Attending: Obstetrics & Gynecology | Admitting: Obstetrics & Gynecology

## 2018-11-02 DIAGNOSIS — Z79899 Other long term (current) drug therapy: Secondary | ICD-10-CM | POA: Diagnosis not present

## 2018-11-02 DIAGNOSIS — O26899 Other specified pregnancy related conditions, unspecified trimester: Secondary | ICD-10-CM

## 2018-11-02 DIAGNOSIS — O26892 Other specified pregnancy related conditions, second trimester: Secondary | ICD-10-CM | POA: Diagnosis not present

## 2018-11-02 DIAGNOSIS — R102 Pelvic and perineal pain: Secondary | ICD-10-CM | POA: Diagnosis not present

## 2018-11-02 DIAGNOSIS — F419 Anxiety disorder, unspecified: Secondary | ICD-10-CM | POA: Diagnosis not present

## 2018-11-02 DIAGNOSIS — Z3A17 17 weeks gestation of pregnancy: Secondary | ICD-10-CM | POA: Diagnosis not present

## 2018-11-02 DIAGNOSIS — F329 Major depressive disorder, single episode, unspecified: Secondary | ICD-10-CM | POA: Insufficient documentation

## 2018-11-02 LAB — URINALYSIS, ROUTINE W REFLEX MICROSCOPIC
Bilirubin Urine: NEGATIVE
Glucose, UA: NEGATIVE mg/dL
Hgb urine dipstick: NEGATIVE
Ketones, ur: NEGATIVE mg/dL
Nitrite: NEGATIVE
Protein, ur: NEGATIVE mg/dL
Specific Gravity, Urine: 1.009 (ref 1.005–1.030)
pH: 8 (ref 5.0–8.0)

## 2018-11-02 MED ORDER — COMFORT FIT MATERNITY SUPP SM MISC: 1.0000 [IU] | Freq: Every day | 0 refills | Status: DC | PRN

## 2018-11-02 MED ORDER — ACETAMINOPHEN 500 MG PO TABS
1000.0000 mg | ORAL_TABLET | Freq: Once | ORAL | Status: AC
  Administered 2018-11-02: 1000 mg via ORAL

## 2018-11-02 NOTE — Discharge Instructions (Signed)
PREGNANCY SUPPORT BELT: You are not alone, Seventy-five percent of women have some sort of abdominal or back pain at some point in their pregnancy. Your baby is growing at a fast pace, which means that your whole body is rapidly trying to adjust to the changes. As your uterus grows, your back may start feeling a bit under stress and this can result in back or abdominal pain that can go from mild, and therefore bearable, to severe pains that will not allow you to sit or lay down comfortably, When it comes to dealing with pregnancy-related pains and cramps, some pregnant women usually prefer natural remedies, which the market is filled with nowadays. For example, wearing a pregnancy support belt can help ease and lessen your discomfort and pain. WHAT ARE THE BENEFITS OF WEARING A PREGNANCY SUPPORT BELT? A pregnancy support belt provides support to the lower portion of the belly taking some of the weight of the growing uterus and distributing to the other parts of your body. It is designed make you comfortable and gives you extra support. Over the years, the pregnancy apparel market has been studying the needs and wants of pregnant women and they have come up with the most comfortable pregnancy support belts that woman could ever ask for. In fact, you will no longer have to wear a stretched-out or bulky pregnancy belt that is visible underneath your clothes and makes you feel even more uncomfortable. Nowadays, a pregnancy support belt is made of comfortable and stretchy materials that will not irritate your skin but will actually make you feel at ease and you will not even notice you are wearing it. They are easy to put on and adjust during the day and can be worn at night for additional support.  BENEFITS:  Relives Back pain  Relieves Abdominal Muscle and Leg Pain  Stabilizes the Pelvic Ring  Offers a Cushioned Abdominal Lift Pad  Relieves pressure on the Sciatic Nerve Within Minutes WHERE TO GET  YOUR PREGNANCY BELT: [employer] [phone] @[phone]  [address] [place], [place] [id number]     Constipation, Adult Constipation is when a person has fewer bowel movements in a week than normal, has difficulty having a bowel movement, or has stools that are dry, hard, or larger than normal. Constipation may be caused by an underlying condition. It may become worse with age if a person takes certain medicines and does not take in enough fluids. Follow these instructions at home: Eating and drinking   Eat foods that have a lot of fiber, such as fresh fruits and vegetables, whole grains, and beans.  Limit foods that are high in fat, low in fiber, or overly processed, such as french fries, hamburgers, cookies, candies, and soda.  Drink enough fluid to keep your urine clear or pale yellow. General instructions  Exercise regularly or as told by your health care provider.  Go to the restroom when you have the urge to go. Do not hold it in.  Take over-the-counter and prescription medicines only as told by your health care provider. These include any fiber supplements.  Practice pelvic floor retraining exercises, such as deep breathing while relaxing the lower abdomen and pelvic floor relaxation during bowel movements.  Watch your condition for any changes.  Keep all follow-up visits as told by your health care provider. This is important. Contact a health care provider if:  You have pain that gets worse.  You have a fever.  You do not have  a bowel movement after 4 days.  You vomit.  You are not hungry.  You lose weight.  You are bleeding from the anus.  You have thin, pencil-like stools. Get help right away if:  You have a fever and your symptoms suddenly get worse.  You leak stool or have blood in your stool.  Your abdomen is bloated.  You have severe pain in your abdomen.  You feel dizzy or you faint. This information is not intended to replace advice  given to you by your health care provider. Make sure you discuss any questions you have with your health care provider. Document Released: 04/04/2004 Document Revised: 01/25/2016 Document Reviewed: 12/26/2015 Elsevier Interactive Patient Education  2019 [employer].

## 2018-11-02 NOTE — MAU Provider Note (Signed)
Chief Complaint: Abdominal Pain   First Provider Initiated Contact with Patient 11/02/18 0043     SUBJECTIVE HPI: Lori Weaver is a 23 y.o. G1P0 at [redacted]w[redacted]d who presents to Maternity Admissions reporting abdominal pain. Symptoms started yesterday. Reports pain in her LLQ that is constant & is worse in waves. Pain worse with movement. Has been taking tylenol with relief, but then pain returns. Took 1 ES tylenol at 7 pm. Denies n/v/d, dysuria, fever/chills, vaginal discharge, LOF, or vaginal bleeding. Last BM was yesterday. States she has daily BMs, but that at times they feel hard. Is not treating constipation.   Location: abdomen Quality: dull, sharp Severity: 8/10 on pain scale Duration: 2 days Timing: intermittent Modifying factors: worse with walking Associated signs and symptoms: none  Past Medical History:  Diagnosis Date  . Allergy   . Anemia   . Anxiety   . Chlamydia   . Depression   . Dysmenorrhea   . Gonorrhea    OB History  Gravida Para Term Preterm AB Living  1            SAB TAB Ectopic Multiple Live Births               # Outcome Date GA Lbr Len/2nd Weight Sex Delivery Anes PTL Lv  1 Current            Past Surgical History:  Procedure Laterality Date  . LAPAROSCOPY N/A 05/06/2017   Procedure: LAPAROSCOPY DIAGNOSTIC;  Surgeon: Tereso Newcomer, MD;  Location: [facility];  Service: Gynecology;  Laterality: N/A;  . LAPAROSCOPY ABDOMEN DIAGNOSTIC    . TONSILLECTOMY AND ADENOIDECTOMY    . TYMPANOSTOMY TUBE PLACEMENT Bilateral   . WISDOM TOOTH EXTRACTION     Social History   Socioeconomic History  . Marital status: Single    Spouse name: Not on file  . Number of children: Not on file  . Years of education: Not on file  . Highest education level: Not on file  Occupational History  . Not on file  Social Needs  . Financial resource strain: Not on file  . Food insecurity:    Worry: Not on file    Inability: Not on file  . Transportation needs:     Medical: Not on file    Non-medical: Not on file  Tobacco Use  . Smoking status: Never Smoker  . Smokeless tobacco: Never Used  Substance and Sexual Activity  . Alcohol use: Not Currently  . Drug use: Not Currently    Types: Marijuana  . Sexual activity: Yes    Birth control/protection: None  Lifestyle  . Physical activity:    Days per week: Not on file    Minutes per session: Not on file  . Stress: Not on file  Relationships  . Social connections:    Talks on phone: Not on file    Gets together: Not on file    Attends religious service: Not on file    Active member of club or organization: Not on file    Attends meetings of clubs or organizations: Not on file    Relationship status: Not on file  . Intimate partner violence:    Fear of current or ex partner: Not on file    Emotionally abused: Not on file    Physically abused: Not on file    Forced sexual activity: Not on file  Other Topics Concern  . Not on file  Social History Narrative  Currently works at a gas station.  Going to school currently for biology.  Plans to get a masters degree.  Plans to go to PA school.   Family History  Problem Relation Age of Onset  . Lupus Mother   . Depression Mother   . Anxiety disorder Mother   . Lupus Maternal Grandmother    No current facility-administered medications on file prior to encounter.    Current Outpatient Medications on File Prior to Encounter  Medication Sig Dispense Refill  . acetaminophen (TYLENOL) 500 MG tablet Take 500 mg by mouth every 6 (six) hours as needed.    Marland Kitchen. buPROPion (WELLBUTRIN XL) 150 MG 24 hr tablet Take 1 tablet (150 mg total) by mouth daily. 30 tablet 2  . Prenatal Vit-Fe Fumarate-FA (PRENATAL MULTIVITAMIN) TABS tablet Take 1 tablet by mouth daily at 12 noon.     Allergies  Allergen Reactions  . Fish Allergy Anaphylaxis  . Soap Rash    Use for Pre-op    I have reviewed patient's Past Medical Hx, Surgical Hx, Family Hx, Social Hx,  medications and allergies.   Review of Systems  Constitutional: Negative.   Gastrointestinal: Positive for abdominal pain.  Genitourinary: Negative.     OBJECTIVE Patient Vitals for the past 24 hrs:  BP Temp Pulse Resp SpO2 Height Weight  11/02/18 0026 (!) 103/52 98.3 F (36.8 C) 69 17 100 % 4\' 10"  (1.473 m) 51.5 kg   Constitutional: Well-developed, well-nourished female in no acute distress.  Cardiovascular: normal rate & rhythm, no murmur Respiratory: normal rate and effort. Lung sounds clear throughout GI: Abd soft, non-tender, Pos BS x 4. No guarding or rebound tenderness MS: Extremities nontender, no edema, normal ROM Neurologic: Alert and oriented x 4.  GU:  Dilation: Closed Effacement (%): Thick Cervical Position: Posterior Exam by:: Judeth HornErin Doyle Tegethoff, NP    LAB RESULTS Results for orders placed or performed during the hospital encounter of 11/02/18 (from the past 24 hour(s))  Urinalysis, Routine w reflex microscopic     Status: Abnormal   Collection Time: 11/02/18 12:48 AM  Result Value Ref Range   Color, Urine YELLOW YELLOW   APPearance HAZY (A) CLEAR   Specific Gravity, Urine 1.009 1.005 - 1.030   pH 8.0 5.0 - 8.0   Glucose, UA NEGATIVE NEGATIVE mg/dL   Hgb urine dipstick NEGATIVE NEGATIVE   Bilirubin Urine NEGATIVE NEGATIVE   Ketones, ur NEGATIVE NEGATIVE mg/dL   Protein, ur NEGATIVE NEGATIVE mg/dL   Nitrite NEGATIVE NEGATIVE   Leukocytes,Ua MODERATE (A) NEGATIVE   RBC / HPF 0-5 0 - 5 RBC/hpf   WBC, UA 6-10 0 - 5 WBC/hpf   Bacteria, UA RARE (A) NONE SEEN   Squamous Epithelial / LPF 0-5 0 - 5   Budding Yeast PRESENT     IMAGING No results found.  MAU COURSE Orders Placed This Encounter  Procedures  . Culture, OB Urine  . Urinalysis, Routine w reflex microscopic  . Discharge patient   Meds ordered this encounter  Medications  . acetaminophen (TYLENOL) tablet 1,000 mg  . Elastic Bandages & Supports (COMFORT FIT MATERNITY SUPP SM) MISC    Sig: 1 Units  by Does not apply route daily as needed.    Dispense:  1 each    Refill:  0    Order Specific Question:   Supervising Provider    Answer:   Adam PhenixARNOLD, JAMES G [[id number]]    MDM FHT present via doppler Cervix closed/thick U/a with moderate leuks. Pt denies  urinary complaints. Will send for culture.  Round ligament pain vs constipation. Will send home with rx for maternity support belt & information regarding treatment of constipation.  ASSESSMENT 1. Pain of round ligament during pregnancy   2. [redacted] weeks gestation of pregnancy     PLAN Discharge home in stable condition. Discussed reasons to return to MAU  Allergies as of 11/02/2018      Reactions   Fish Allergy Anaphylaxis   Soap Rash   Use for Pre-op      Medication List    TAKE these medications   acetaminophen 500 MG tablet Commonly known as:  TYLENOL Take 500 mg by mouth every 6 (six) hours as needed.   buPROPion 150 MG 24 hr tablet Commonly known as:  WELLBUTRIN XL Take 1 tablet (150 mg total) by mouth daily.   Comfort Fit Maternity Supp Sm Misc 1 Units by Does not apply route daily as needed.   prenatal multivitamin Tabs tablet Take 1 tablet by mouth daily at 12 noon.        Judeth Horn, NP 11/02/2018  1:13 AM

## 2018-11-02 NOTE — MAU Note (Signed)
Left lower abdominal pain that started yesterday.  Took 500 mg tylenol at 1900 with relief but it came back.  States she only had 2 left so didn't want to take another one.  No VB/discharge.

## 2018-11-03 LAB — CULTURE, OB URINE: Culture: 20000 — AB

## 2018-11-09 ENCOUNTER — Encounter: Payer: Self-pay | Admitting: General Practice

## 2018-11-11 ENCOUNTER — Other Ambulatory Visit: Payer: Medicaid Other

## 2018-11-11 ENCOUNTER — Other Ambulatory Visit: Payer: Self-pay

## 2018-11-11 ENCOUNTER — Other Ambulatory Visit: Payer: Self-pay | Admitting: General Practice

## 2018-11-11 ENCOUNTER — Ambulatory Visit (HOSPITAL_COMMUNITY)
Admission: RE | Admit: 2018-11-11 | Discharge: 2018-11-11 | Disposition: A | Discharge status: Home / Self Care | Payer: Medicaid Other | Source: Ambulatory Visit | Attending: Obstetrics and Gynecology | Admitting: Obstetrics and Gynecology

## 2018-11-11 ENCOUNTER — Other Ambulatory Visit: Payer: Self-pay | Admitting: Medical

## 2018-11-11 DIAGNOSIS — Z3480 Encounter for supervision of other normal pregnancy, unspecified trimester: Secondary | ICD-10-CM

## 2018-11-11 DIAGNOSIS — Z3A19 19 weeks gestation of pregnancy: Secondary | ICD-10-CM | POA: Diagnosis not present

## 2018-11-11 DIAGNOSIS — Z363 Encounter for antenatal screening for malformations: Secondary | ICD-10-CM | POA: Diagnosis not present

## 2018-11-17 ENCOUNTER — Telehealth: Payer: Self-pay | Admitting: *Deleted

## 2018-11-17 ENCOUNTER — Telehealth: Payer: Self-pay | Admitting: Obstetrics and Gynecology

## 2018-11-17 NOTE — Telephone Encounter (Signed)
Called the patient to inform of upcoming appointment, left a detailed voicemail °

## 2018-11-17 NOTE — Telephone Encounter (Signed)
Called pt to discuss babyscripts and ensure that she is able to use the app and enter her blood pressure.  Pt did not pick up.  Left a message informing pt that she was being contacted in regards to babyscripts and requesting that she take and log a blood pressure into the app.  Advised pt to call the clinic if she has difficulty with this. MyChart message sent.  

## 2018-11-18 ENCOUNTER — Encounter: Payer: Medicaid Other | Admitting: Obstetrics & Gynecology

## 2018-11-19 LAB — AFP, SERUM, OPEN SPINA BIFIDA
AFP MoM: 1.31
AFP Value: 84.5 ng/mL
Gest. Age on Collection Date: 19 weeks
Maternal Age At EDD: 22.3 yr
OSBR Risk 1 IN: 9327
Test Results:: NEGATIVE
Weight: 114 [lb_av]

## 2018-12-18 ENCOUNTER — Inpatient Hospital Stay (HOSPITAL_COMMUNITY)
Admission: AD | Admit: 2018-12-18 | Discharge: 2018-12-19 | Disposition: A | Discharge status: Home / Self Care | Payer: Medicaid Other | Attending: Obstetrics and Gynecology | Admitting: Obstetrics and Gynecology

## 2018-12-18 ENCOUNTER — Encounter (HOSPITAL_COMMUNITY): Payer: Self-pay | Admitting: Nurse Practitioner

## 2018-12-18 DIAGNOSIS — Z91013 Allergy to seafood: Secondary | ICD-10-CM | POA: Insufficient documentation

## 2018-12-18 DIAGNOSIS — O26892 Other specified pregnancy related conditions, second trimester: Secondary | ICD-10-CM | POA: Diagnosis not present

## 2018-12-18 DIAGNOSIS — Z91048 Other nonmedicinal substance allergy status: Secondary | ICD-10-CM | POA: Insufficient documentation

## 2018-12-18 DIAGNOSIS — O2342 Unspecified infection of urinary tract in pregnancy, second trimester: Secondary | ICD-10-CM

## 2018-12-18 DIAGNOSIS — R1030 Lower abdominal pain, unspecified: Secondary | ICD-10-CM | POA: Insufficient documentation

## 2018-12-18 DIAGNOSIS — Z3A24 24 weeks gestation of pregnancy: Secondary | ICD-10-CM | POA: Diagnosis not present

## 2018-12-18 DIAGNOSIS — R109 Unspecified abdominal pain: Secondary | ICD-10-CM | POA: Diagnosis not present

## 2018-12-18 DIAGNOSIS — O9989 Other specified diseases and conditions complicating pregnancy, childbirth and the puerperium: Secondary | ICD-10-CM | POA: Diagnosis not present

## 2018-12-18 NOTE — MAU Note (Signed)
Tightness and some SOB started last night 12/17/18 frequently. She asked friends and family about that sensation and they advised her to come get checked out. She currently is having some tightness around the middle of the abdomen and bilateral sides, denies radiation of pain to back. Pt reports 6-7/10 discomfort right now. Pt took anti-depressant and pre-natal vitamin today. No other interventions tried.  Pt reports vaginal discharge that has increased over the past two days, it feels wet to the point of changing underwear twice with some saturation. She reports it is milky in color and thicker than normal. Denies bleeding. Denies sexual intercourse in the past 24 hours. Denies decrease in FM.

## 2018-12-19 ENCOUNTER — Encounter (HOSPITAL_COMMUNITY): Payer: Self-pay | Admitting: Nurse Practitioner

## 2018-12-19 DIAGNOSIS — R109 Unspecified abdominal pain: Secondary | ICD-10-CM

## 2018-12-19 DIAGNOSIS — Z3A24 24 weeks gestation of pregnancy: Secondary | ICD-10-CM

## 2018-12-19 DIAGNOSIS — O2342 Unspecified infection of urinary tract in pregnancy, second trimester: Secondary | ICD-10-CM

## 2018-12-19 DIAGNOSIS — O26892 Other specified pregnancy related conditions, second trimester: Secondary | ICD-10-CM

## 2018-12-19 LAB — URINALYSIS, ROUTINE W REFLEX MICROSCOPIC
Bilirubin Urine: NEGATIVE
Glucose, UA: NEGATIVE mg/dL
Hgb urine dipstick: NEGATIVE
Ketones, ur: NEGATIVE mg/dL
Nitrite: NEGATIVE
Protein, ur: NEGATIVE mg/dL
Specific Gravity, Urine: 1.011 (ref 1.005–1.030)
pH: 7 (ref 5.0–8.0)

## 2018-12-19 LAB — WET PREP, GENITAL
Clue Cells Wet Prep HPF POC: NONE SEEN
Sperm: NONE SEEN
Trich, Wet Prep: NONE SEEN
Yeast Wet Prep HPF POC: NONE SEEN

## 2018-12-19 MED ORDER — CEPHALEXIN 500 MG PO CAPS: 500.0000 mg | ORAL_CAPSULE | Freq: Three times a day (TID) | ORAL | 0 refills | Status: DC

## 2018-12-19 NOTE — MAU Provider Note (Signed)
Chief Complaint:  Abdominal Pain   First Provider Initiated Contact with Patient 12/19/18 0012      HPI: Lori Weaver is a 23 y.o. G1P0 at [redacted]w[redacted]d by LMP who presents to maternity admissions reporting abdominal cramping and increased watery discharge today. She reports intermittent tightening and cramping pain in her low abdomen starting at 5-6 pm yesterday.  The pain has remained the same since onset.  The vaginal discharge is enough to wet her underwear but has not required a pad.  It is clear without odor.  There are no other symptoms, she has not tried any treatments.  She reports good fetal movement.  HPI  Past Medical History: Past Medical History:  Diagnosis Date  . Allergy   . Anemia   . Anxiety   . Chlamydia   . Depression   . Dysmenorrhea   . Gonorrhea     Past obstetric history: OB History  Gravida Para Term Preterm AB Living  1            SAB TAB Ectopic Multiple Live Births               # Outcome Date GA Lbr Len/2nd Weight Sex Delivery Anes PTL Lv  1 Current             Past Surgical History: Past Surgical History:  Procedure Laterality Date  . LAPAROSCOPY N/A 05/06/2017   Procedure: LAPAROSCOPY DIAGNOSTIC;  Surgeon: Tereso Newcomer, MD;  Location: [facility];  Service: Gynecology;  Laterality: N/A;  . LAPAROSCOPY ABDOMEN DIAGNOSTIC    . TONSILLECTOMY AND ADENOIDECTOMY    . TYMPANOSTOMY TUBE PLACEMENT Bilateral   . WISDOM TOOTH EXTRACTION      Family History: Family History  Problem Relation Age of Onset  . Lupus Mother   . Depression Mother   . Anxiety disorder Mother   . Lupus Maternal Grandmother     Social History: Social History   Tobacco Use  . Smoking status: Never Smoker  . Smokeless tobacco: Never Used  Substance Use Topics  . Alcohol use: Not Currently  . Drug use: Not Currently    Allergies:  Allergies  Allergen Reactions  . Fish Allergy Anaphylaxis  . Soap Rash    Use for Pre-op    Meds:  Medications Prior  to Admission  Medication Sig Dispense Refill Last Dose  . acetaminophen (TYLENOL) 500 MG tablet Take 500 mg by mouth every 6 (six) hours as needed.   Past Month at Unknown time  . buPROPion (WELLBUTRIN XL) 150 MG 24 hr tablet Take 1 tablet (150 mg total) by mouth daily. 30 tablet 2 12/18/2018 at Unknown time  . Elastic Bandages & Supports (COMFORT FIT MATERNITY SUPP SM) MISC 1 Units by Does not apply route daily as needed. 1 each 0 Past Month at Unknown time  . Prenatal Vit-Fe Fumarate-FA (PRENATAL MULTIVITAMIN) TABS tablet Take 1 tablet by mouth daily at 12 noon.   12/18/2018 at Unknown time    ROS:  Review of Systems   I have reviewed patient's Past Medical Hx, Surgical Hx, Family Hx, Social Hx, medications and allergies.   Physical Exam   Patient Vitals for the past 24 hrs:  BP Temp Temp src Pulse Resp SpO2 Height Weight  12/18/18 2331 121/62 99.2 F (37.3 C) Oral 99 18 100 % 4\' 10"  (1.473 m) 54.7 kg  12/18/18 2313 - - - - - - 4\' 10"  (1.473 m) 54.7 kg   Constitutional: Well-developed, well-nourished  female in no acute distress.  Cardiovascular: normal rate Respiratory: normal effort GI: Abd soft, non-tender, gravid appropriate for gestational age.  MS: Extremities nontender, no edema, normal ROM Neurologic: Alert and oriented x 4.  GU: Neg CVAT.  PELVIC EXAM: Cervix pink, visually closed, friable to cotton swab, small amount of milky white discharge, vaginal walls and external genitalia normal  Dilation: Closed Effacement (%): Thick Cervical Position: Posterior Exam by:: Sharen CounterLisa Leftwich-Kirby CNM  FHT:  Baseline 145, moderate variability, accelerations present, no decelerations Contractions: None on toco or to palpation   Labs: Results for orders placed or performed during the hospital encounter of 12/18/18 (from the past 24 hour(s))  Urinalysis, Routine w reflex microscopic     Status: Abnormal   Collection Time: 12/18/18 11:43 PM  Result Value Ref Range   Color, Urine  YELLOW YELLOW   APPearance CLEAR CLEAR   Specific Gravity, Urine 1.011 1.005 - 1.030   pH 7.0 5.0 - 8.0   Glucose, UA NEGATIVE NEGATIVE mg/dL   Hgb urine dipstick NEGATIVE NEGATIVE   Bilirubin Urine NEGATIVE NEGATIVE   Ketones, ur NEGATIVE NEGATIVE mg/dL   Protein, ur NEGATIVE NEGATIVE mg/dL   Nitrite NEGATIVE NEGATIVE   Leukocytes,Ua MODERATE (A) NEGATIVE   RBC / HPF 0-5 0 - 5 RBC/hpf   WBC, UA 6-10 0 - 5 WBC/hpf   Bacteria, UA MANY (A) NONE SEEN   Squamous Epithelial / LPF 0-5 0 - 5   Mucus PRESENT   Wet prep, genital     Status: Abnormal   Collection Time: 12/19/18 12:06 AM  Result Value Ref Range   Yeast Wet Prep HPF POC NONE SEEN NONE SEEN   Trich, Wet Prep NONE SEEN NONE SEEN   Clue Cells Wet Prep HPF POC NONE SEEN NONE SEEN   WBC, Wet Prep HPF POC MANY (A) NONE SEEN   Sperm NONE SEEN    B/Positive/-- (02/26 0000)  Imaging:  No results found.  MAU Course/MDM: Orders Placed This Encounter  Procedures  . Wet prep, genital  . OB Urine Culture  . Urinalysis, Routine w reflex microscopic  . Discharge patient    Meds ordered this encounter  Medications  . cephALEXin (KEFLEX) 500 MG capsule    Sig: Take 1 capsule (500 mg total) by mouth 3 (three) times daily.    Dispense:  21 capsule    Refill:  0    Order Specific Question:   Supervising Provider    Answer:   Conan BowensDAVIS, KELLY M [[id number]]     NST reviewed and appropriate for gestational age Cervix closed/thick, no evidence of preterm labor No pooling or ferning, amnisure not collected due to friable cervix and bleeding during pelvic exam. No evidence of PPROM. UA with moderate leukocytes and WBCs, may indicate UTI Will treat with Keflex 500 mg TID x 7 days, urine culture sent today Bleeding precautions reviewed, spotting after exam expected but return to care if heavy bleeding Increase PO fluids, complete course of abx D/C home with preterm labor precautions, f/u in office as scheduled  Pt discharge with strict  return precautions.    Assessment: 1. UTI (urinary tract infection) during pregnancy, second trimester   2. Abdominal pain during pregnancy in second trimester     Plan: Discharge home Labor precautions and fetal kick counts Follow-up Information    [facility]   Specialty:  Obstetrics and Gynecology Why:  As scheduled, return to MAU as needed for emergencies Contact information: [address], [address] [id number] mc [place] [address] [phone]         Allergies as of 12/19/2018      Reactions   Fish Allergy Anaphylaxis   Soap Rash   Use for Pre-op      Medication List    TAKE these medications   acetaminophen 500 MG tablet Commonly known as:  TYLENOL Take 500 mg by mouth every 6 (six) hours as needed.   buPROPion 150 MG 24 hr tablet Commonly known as:  WELLBUTRIN XL Take 1 tablet (150 mg total) by mouth daily.   cephALEXin 500 MG capsule Commonly known as:  KEFLEX Take 1 capsule (500 mg total) by mouth 3 (three) times daily.   Comfort Fit Maternity Supp Sm Misc 1 Units by Does not apply route daily as needed.   prenatal multivitamin Tabs tablet Take 1 tablet by mouth daily at 12 noon.       Sharen Counter Certified Nurse-Midwife 12/19/2018 1:13 AM

## 2018-12-20 ENCOUNTER — Telehealth: Payer: Self-pay | Admitting: Advanced Practice Midwife

## 2018-12-20 LAB — CULTURE, OB URINE: Culture: 40000 — AB

## 2018-12-20 LAB — GC/CHLAMYDIA PROBE AMP (~~LOC~~) NOT AT ARMC
Chlamydia: NEGATIVE
Neisseria Gonorrhea: NEGATIVE

## 2018-12-20 MED ORDER — TERCONAZOLE 0.4 % VA CREA: 1.0000 | TOPICAL_CREAM | Freq: Every day | VAGINAL | 0 refills | Status: DC

## 2018-12-20 NOTE — Telephone Encounter (Signed)
Pt treated for UTI at MAU visit 5/31.  Pt with abdominal pain/cramping at 24 weeks of pregnancy.  Urine culture with lactobacillus only so called pt to discuss symptoms and offer treatment for vaginal candidiasis in addition to abx for UTI. Rx sent to pt pharmacy for Terazol 7.  Left message for pt to return call to office ([facility]) today, 12/20/18.

## 2019-01-06 LAB — OB RESULTS CONSOLE HGB/HCT, BLOOD
HCT: 28 — AB (ref 29–41)
Hemoglobin: 9.3

## 2019-01-06 LAB — OB RESULTS CONSOLE RPR: RPR: NONREACTIVE

## 2019-01-06 LAB — GLUCOSE, 1 HOUR: Glucose 1 Hour: 62

## 2019-01-06 LAB — OB RESULTS CONSOLE PLATELET COUNT: Platelets: 308

## 2019-01-06 LAB — OB RESULTS CONSOLE HIV ANTIBODY (ROUTINE TESTING): HIV: NONREACTIVE

## 2019-01-20 ENCOUNTER — Telehealth: Payer: Self-pay | Admitting: Family Medicine

## 2019-01-20 NOTE — Telephone Encounter (Signed)
Patient called to say she needed to start prenatal care. She stated she was going to a birthing center, but they closed down. Then she was going to go to another center, but they were too full. She had some things done at the birthing center which she will get faxed here so she can start care here again.

## 2019-01-25 ENCOUNTER — Encounter: Payer: Self-pay | Admitting: *Deleted

## 2019-01-25 ENCOUNTER — Telehealth: Payer: Self-pay | Admitting: Nurse Practitioner

## 2019-01-25 NOTE — Telephone Encounter (Signed)
Spoke with patient about her appointment on 7/8 @ 1:35. Patient instructed that this is a Advertising account planner visit. Patient verbalized that she has the app downloaded and knows how to access the appointment.

## 2019-01-26 ENCOUNTER — Other Ambulatory Visit: Payer: Self-pay

## 2019-01-26 ENCOUNTER — Telehealth (INDEPENDENT_AMBULATORY_CARE_PROVIDER_SITE_OTHER): Payer: Medicaid Other | Admitting: Nurse Practitioner

## 2019-01-26 ENCOUNTER — Encounter: Payer: Self-pay | Admitting: *Deleted

## 2019-01-26 ENCOUNTER — Encounter: Payer: Self-pay | Admitting: Nurse Practitioner

## 2019-01-26 ENCOUNTER — Telehealth: Payer: Self-pay | Admitting: Nurse Practitioner

## 2019-01-26 VITALS — BP 101/60 | HR 80

## 2019-01-26 DIAGNOSIS — F32A Depression, unspecified: Secondary | ICD-10-CM

## 2019-01-26 DIAGNOSIS — O99013 Anemia complicating pregnancy, third trimester: Secondary | ICD-10-CM

## 2019-01-26 DIAGNOSIS — Z3A29 29 weeks gestation of pregnancy: Secondary | ICD-10-CM

## 2019-01-26 DIAGNOSIS — F329 Major depressive disorder, single episode, unspecified: Secondary | ICD-10-CM

## 2019-01-26 DIAGNOSIS — O99019 Anemia complicating pregnancy, unspecified trimester: Secondary | ICD-10-CM | POA: Insufficient documentation

## 2019-01-26 DIAGNOSIS — Z3480 Encounter for supervision of other normal pregnancy, unspecified trimester: Secondary | ICD-10-CM

## 2019-01-26 DIAGNOSIS — O99343 Other mental disorders complicating pregnancy, third trimester: Secondary | ICD-10-CM

## 2019-01-26 MED ORDER — BUPROPION HCL ER (XL) 150 MG PO TB24: 150.0000 mg | ORAL_TABLET | Freq: Every day | ORAL | 2 refills | Status: DC

## 2019-01-26 MED ORDER — FERROUS FUMARATE 324 (106 FE) MG PO TABS: 1.0000 | ORAL_TABLET | Freq: Two times a day (BID) | ORAL | 3 refills | Status: DC

## 2019-01-26 MED ORDER — DOCUSATE CALCIUM 240 MG PO CAPS: 240.0000 mg | ORAL_CAPSULE | Freq: Every day | ORAL | 2 refills | Status: DC

## 2019-01-26 NOTE — Progress Notes (Signed)
   TELEHEALTH VIRTUAL OBSTETRICS VISIT ENCOUNTER NOTE  I connected with Lori Weaver on 01/26/19 at  1:35 PM EDT by MyChart video at home and verified that I am speaking with the correct person using two identifiers.   I discussed the limitations, risks, security and privacy concerns of performing an evaluation and management service by telephone and the availability of in person appointments. I also discussed with the patient that there may be a patient responsible charge related to this service. The patient expressed understanding and agreed to proceed.  Subjective:  Lori Weaver is a 23 y.o. G1P0 at [redacted]w[redacted]d being followed for ongoing prenatal care.  She was seen for care at [place] returns to this office where she was seen before she transferred to [place].  Asking about water birth and advised it is not currently available in Covid 19.  She is currently monitored for the following issues for this low-risk pregnancy and has Family history of lupus erythematosus; Depression; Acne vulgaris; Irritant contact dermatitis; Supervision of other normal pregnancy, antepartum; Anxiety; and Anemia in pregnancy on their problem list.  Patient reports no complaints. Reports fetal movement. Denies any contractions, bleeding or leaking of fluid.   The following portions of the patient's history were reviewed and updated as appropriate: allergies, current medications, past family history, past medical history, past social history, past surgical history and problem list.   Objective:   General:  Alert, oriented and cooperative.   Mental Status: Normal mood and affect perceived. Normal judgment and thought content.  Rest of physical exam deferred due to type of encounter  Assessment and Plan:  Pregnancy: G1P0 at [redacted]w[redacted]d 1. Supervision of other normal pregnancy, antepartum Reviewed new WC&C, entrance C for birth Advised childbirth and breastfeeding classes - advised to check on Babyscripts App  2.  Depression Refilled Wellbutrin XL No current symptoms  3. Anemia Ordered iron supplement and stool softener for client to take and will recheck hemoglobin at 34 weeks.  Preterm labor symptoms and general obstetric precautions including but not limited to vaginal bleeding, contractions, leaking of fluid and fetal movement were reviewed in detail with the patient.  I discussed the assessment and treatment plan with the patient. The patient was provided an opportunity to ask questions and all were answered. The patient agreed with the plan and demonstrated an understanding of the instructions. The patient was advised to call back or seek an in-person office evaluation/go to MAU at [facility] for any urgent or concerning symptoms. Please refer to After Visit Summary for other counseling recommendations.   I provided 12 minutes of non-face-to-face time during this encounter.  Return in about 2 weeks (around 02/09/2019) for MyChart video visit.  Future Appointments  Date Time Provider [place]  02/09/2019 11:15 AM Julianne Handler, CNM [facility] [place], NP Center for [employer] [facility], [place]

## 2019-01-26 NOTE — Progress Notes (Signed)
Reviewed chart with patient since her last visit with [place]. She still has her bp cuff and took her bp while on phone. She does not have [employer] app any longer and I informed her we will have [employer] send her a new email with relink- and she can download again. I instructed her to start taking bp and logging in once  A week. States she cannot to her weight but last weight 122.4 lb at [employer]

## 2019-01-26 NOTE — Progress Notes (Signed)
1:24 I called Elwyn to see if she is ready to start virtual visit early- left message calling for virtual visit and she can log in when she is ready and/ or I will her in a few minutes.  Vaughan Basta, RN I connected with  Carroll Sage on 01/26/19 at  1:35 PM EDT by telephone and verified that I am speaking with the correct person using two identifiers.   I discussed the limitations, risks, security and privacy concerns of performing an evaluation and management service by telephone and virtual and the availability of in person appointments. I also discussed with the patient that there may be a patient responsible charge related to this service. The patient expressed understanding and agreed to proceed.  Evalin Shawhan,RN 01/26/2019  1:34 PM

## 2019-01-26 NOTE — Telephone Encounter (Signed)
Attempted to call patient with her next ob appointment. No answer, left a voicemail with the appointment information. Appointment reminder also mailed.

## 2019-01-31 ENCOUNTER — Other Ambulatory Visit: Payer: Self-pay

## 2019-01-31 ENCOUNTER — Encounter (HOSPITAL_COMMUNITY): Payer: Self-pay

## 2019-01-31 ENCOUNTER — Inpatient Hospital Stay (HOSPITAL_COMMUNITY)
Admission: AD | Admit: 2019-01-31 | Discharge: 2019-02-01 | Disposition: A | Discharge status: Home / Self Care | Payer: Medicare Other | Attending: Obstetrics and Gynecology | Admitting: Obstetrics and Gynecology

## 2019-01-31 DIAGNOSIS — O4702 False labor before 37 completed weeks of gestation, second trimester: Secondary | ICD-10-CM

## 2019-01-31 DIAGNOSIS — R197 Diarrhea, unspecified: Secondary | ICD-10-CM | POA: Insufficient documentation

## 2019-01-31 DIAGNOSIS — Z3A30 30 weeks gestation of pregnancy: Secondary | ICD-10-CM | POA: Insufficient documentation

## 2019-01-31 DIAGNOSIS — O26893 Other specified pregnancy related conditions, third trimester: Secondary | ICD-10-CM | POA: Insufficient documentation

## 2019-01-31 LAB — URINALYSIS, ROUTINE W REFLEX MICROSCOPIC
Bacteria, UA: NONE SEEN
Bilirubin Urine: NEGATIVE
Glucose, UA: NEGATIVE mg/dL
Hgb urine dipstick: NEGATIVE
Ketones, ur: NEGATIVE mg/dL
Nitrite: NEGATIVE
Protein, ur: 30 mg/dL — AB
Specific Gravity, Urine: 1.018 (ref 1.005–1.030)
pH: 7 (ref 5.0–8.0)

## 2019-01-31 NOTE — MAU Note (Signed)
At 1700 started having abdominal cramping and then diarrhea about an hour later.  3X since then.  No VB/LOF.  Feeling some tightening with the cramping but not sure if it's contractions.  + FM.

## 2019-02-01 DIAGNOSIS — R197 Diarrhea, unspecified: Secondary | ICD-10-CM | POA: Diagnosis not present

## 2019-02-01 DIAGNOSIS — Z3A30 30 weeks gestation of pregnancy: Secondary | ICD-10-CM | POA: Diagnosis not present

## 2019-02-01 DIAGNOSIS — O26893 Other specified pregnancy related conditions, third trimester: Secondary | ICD-10-CM | POA: Diagnosis not present

## 2019-02-01 DIAGNOSIS — R109 Unspecified abdominal pain: Secondary | ICD-10-CM | POA: Diagnosis present

## 2019-02-01 DIAGNOSIS — O4702 False labor before 37 completed weeks of gestation, second trimester: Secondary | ICD-10-CM | POA: Diagnosis not present

## 2019-02-01 LAB — WET PREP, GENITAL
Clue Cells Wet Prep HPF POC: NONE SEEN
Sperm: NONE SEEN
Trich, Wet Prep: NONE SEEN
Yeast Wet Prep HPF POC: NONE SEEN

## 2019-02-01 LAB — GC/CHLAMYDIA PROBE AMP (~~LOC~~) NOT AT ARMC
Chlamydia: NEGATIVE
Neisseria Gonorrhea: NEGATIVE

## 2019-02-01 LAB — FETAL FIBRONECTIN: Fetal Fibronectin: NEGATIVE

## 2019-02-01 MED ORDER — LOPERAMIDE HCL 2 MG PO CAPS
4.0000 mg | ORAL_CAPSULE | Freq: Once | ORAL | Status: AC
  Administered 2019-02-01: 4 mg via ORAL
  Filled 2019-02-01: qty 2

## 2019-02-01 NOTE — Discharge Instructions (Signed)
Food Choices to Help Relieve Diarrhea, Adult When you have diarrhea, the foods you eat and your eating habits are very important. Choosing the right foods and drinks can help:  Relieve diarrhea.  Replace lost fluids and nutrients.  Prevent dehydration. What general guidelines should I follow?  Relieving diarrhea  Choose foods with less than 2 g or .07 oz. of fiber per serving.  Limit fats to less than 8 tsp (38 g or 1.34 oz.) a day.  Avoid the following: ? Foods and beverages sweetened with high-fructose corn syrup, honey, or sugar alcohols such as xylitol, sorbitol, and mannitol. ? Foods that contain a lot of fat or sugar. ? Fried, greasy, or spicy foods. ? High-fiber grains, breads, and cereals. ? Raw fruits and vegetables.  Eat foods that are rich in probiotics. These foods include dairy products such as yogurt and fermented milk products. They help increase healthy bacteria in the stomach and intestines (gastrointestinal tract, or GI tract).  If you have lactose intolerance, avoid dairy products. These may make your diarrhea worse.  Take medicine to help stop diarrhea (antidiarrheal medicine) only as told by your health care provider. Replacing nutrients  Eat small meals or snacks every 3-4 hours.  Eat bland foods, such as white rice, toast, or baked potato, until your diarrhea starts to get better. Gradually reintroduce nutrient-rich foods as tolerated or as told by your health care provider. This includes: ? Well-cooked protein foods. ? Peeled, seeded, and soft-cooked fruits and vegetables. ? Low-fat dairy products.  Take vitamin and mineral supplements as told by your health care provider. Preventing dehydration  Start by sipping water or a special solution to prevent dehydration (oral rehydration solution, ORS). Urine that is clear or pale yellow means that you are getting enough fluid.  Try to drink at least 8-10 cups of fluid each day to help replace lost  fluids.  You may add other liquids in addition to water, such as clear juice or decaffeinated sports drinks, as tolerated or as told by your health care provider.  Avoid drinks with caffeine, such as coffee, tea, or soft drinks.  Avoid alcohol. What foods are recommended?     The items listed may not be a complete list. Talk with your health care provider about what dietary choices are best for you. Grains White rice. White, French, or pita breads (fresh or toasted), including plain rolls, buns, or bagels. White pasta. Saltine, soda, or graham crackers. Pretzels. Low-fiber cereal. Cooked cereals made with water (such as cornmeal, farina, or cream cereals). Plain muffins. Matzo. Melba toast. Zwieback. Vegetables Potatoes (without the skin). Most well-cooked and canned vegetables without skins or seeds. Tender lettuce. Fruits Apple sauce. Fruits canned in juice. Cooked apricots, cherries, grapefruit, peaches, pears, or plums. Fresh bananas and cantaloupe. Meats and other protein foods Baked or boiled chicken. Eggs. Tofu. Fish. Seafood. Smooth nut butters. Ground or well-cooked tender beef, ham, veal, lamb, pork, or poultry. Dairy Plain yogurt, kefir, and unsweetened liquid yogurt. Lactose-free milk, buttermilk, skim milk, or soy milk. Low-fat or nonfat hard cheese. Beverages Water. Low-calorie sports drinks. Fruit juices without pulp. Strained tomato and vegetable juices. Decaffeinated teas. Sugar-free beverages not sweetened with sugar alcohols. Oral rehydration solutions, if approved by your health care provider. Seasoning and other foods Bouillon, broth, or soups made from recommended foods. What foods are not recommended? The items listed may not be a complete list. Talk with your health care provider about what dietary choices are best for you. Grains Whole   grain, whole wheat, bran, or rye breads, rolls, pastas, and crackers. Wild or brown rice. Whole grain or bran cereals. Barley.  Oats and oatmeal. Corn tortillas or taco shells. Granola. Popcorn. Vegetables Raw vegetables. Fried vegetables. Cabbage, broccoli, Brussels sprouts, artichokes, baked beans, beet greens, corn, kale, legumes, peas, sweet potatoes, and yams. Potato skins. Cooked spinach and cabbage. Fruits Dried fruit, including raisins and dates. Raw fruits. Stewed or dried prunes. Canned fruits with syrup. Meat and other protein foods Fried or fatty meats. Deli meats. Chunky nut butters. Nuts and seeds. Beans and lentils. Bacon. Hot dogs. Sausage. Dairy High-fat cheeses. Whole milk, chocolate milk, and beverages made with milk, such as milk shakes. Half-and-half. Cream. sour cream. Ice cream. Beverages Caffeinated beverages (such as coffee, tea, soda, or energy drinks). Alcoholic beverages. Fruit juices with pulp. Prune juice. Soft drinks sweetened with high-fructose corn syrup or sugar alcohols. High-calorie sports drinks. Fats and oils Butter. Cream sauces. Margarine. Salad oils. Plain salad dressings. Olives. Avocados. Mayonnaise. Sweets and desserts Sweet rolls, doughnuts, and sweet breads. Sugar-free desserts sweetened with sugar alcohols such as xylitol and sorbitol. Seasoning and other foods Honey. Hot sauce. Chili powder. Gravy. Cream-based or milk-based soups. Pancakes and waffles. Summary  When you have diarrhea, the foods you eat and your eating habits are very important.  Make sure you get at least 8-10 cups of fluid each day, or enough to keep your urine clear or pale yellow.  Eat bland foods and gradually reintroduce healthy, nutrient-rich foods as tolerated, or as told by your health care provider.  Avoid high-fiber, fried, greasy, or spicy foods. This information is not intended to replace advice given to you by your health care provider. Make sure you discuss any questions you have with your health care provider. Document Released: 09/27/2003 Document Revised: 10/28/2018 Document Reviewed:  07/04/2016 Elsevier Patient Education  2020 Elsevier Inc.  

## 2019-02-01 NOTE — MAU Provider Note (Signed)
History     CSN: [id number]  Arrival date and time: 01/31/19 2231   None     Chief Complaint  Patient presents with  . Abdominal Pain  . Diarrhea   Lori Weaver is a 23 y.o. G1P0 at [redacted]w[redacted]d who receives care at [facility].  She presents today for Abdominal Pain and Diarrhea.  She states she started experiencing stomach cramps around 5pm and had only eaten a boiled egg and some water.  She states she the drove to [place] with her boyfriend and was eating at [facility]. She states the cramping became more severe and she had her first of 3 bouts of diarrhea. She states the cramping is constant, but the severity is intermittent as it gets worse prior to her diarrhea incidents.  She states she has had diarrhea once since arrival.  She states she has not been around anyone other than her boyfriend and he has not displayed any symptoms.     Recent Dietary Intake  Breakfast: Boiled egg water  Dinner: Steak, loaded mash, sweet potatoes, rolls, potato skins  Saturday:  Spaghetti Spinach Salad with boiled Egg     OB History    Gravida  1   Para      Term      Preterm      AB      Living        SAB      TAB      Ectopic      Multiple      Live Births              Past Medical History:  Diagnosis Date  . Allergy   . Anemia   . Anxiety   . Chlamydia   . Depression   . Dysmenorrhea   . Gonorrhea     Past Surgical History:  Procedure Laterality Date  . LAPAROSCOPY N/A 05/06/2017   Procedure: LAPAROSCOPY DIAGNOSTIC;  Surgeon: Osborne Oman, MD;  Location: [facility];  Service: Gynecology;  Laterality: N/A;  . LAPAROSCOPY ABDOMEN DIAGNOSTIC    . TONSILLECTOMY AND ADENOIDECTOMY    . TYMPANOSTOMY TUBE PLACEMENT Bilateral   . WISDOM TOOTH EXTRACTION      Family History  Problem Relation Age of Onset  . Lupus Mother   . Depression Mother   . Anxiety disorder Mother   . Migraines Mother   . Lupus Maternal Grandmother     Social  History   Tobacco Use  . Smoking status: Never Smoker  . Smokeless tobacco: Never Used  Substance Use Topics  . Alcohol use: Not Currently  . Drug use: Not Currently    Allergies:  Allergies  Allergen Reactions  . Fish Allergy Anaphylaxis  . Soap Rash    Use for Pre-op    Medications Prior to Admission  Medication Sig Dispense Refill Last Dose  . buPROPion (WELLBUTRIN XL) 150 MG 24 hr tablet Take 1 tablet (150 mg total) by mouth daily. 30 tablet 2 01/31/2019 at Unknown time  . acetaminophen (TYLENOL) 500 MG tablet Take 500 mg by mouth every 6 (six) hours as needed.     . docusate calcium (SURFAK) 240 MG capsule Take 1 capsule (240 mg total) by mouth daily. 30 capsule 2   . Elastic Bandages & Supports (COMFORT FIT MATERNITY SUPP SM) MISC 1 Units by Does not apply route daily as needed. 1 each 0   . Ferrous Fumarate (HEMOCYTE) 324 (106 Fe) MG TABS tablet Take  1 tablet (106 mg of iron total) by mouth 2 (two) times daily. 60 tablet 3   . penicillin v potassium (VEETID) 500 MG tablet TK 1 T PO Q 6 H     . Prenatal Vit-Fe Fumarate-FA (PRENATAL MULTIVITAMIN) TABS tablet Take 1 tablet by mouth daily at 12 noon.       Review of Systems  Constitutional: Positive for chills. Negative for fever.  Respiratory: Negative for cough and shortness of breath.   Gastrointestinal: Positive for abdominal pain (Cramping) and diarrhea. Negative for nausea and vomiting.  Genitourinary: Negative for difficulty urinating, dysuria, pelvic pain, vaginal bleeding and vaginal discharge.  Musculoskeletal: Negative for back pain.  Neurological: Negative for dizziness, light-headedness and headaches.   Physical Exam   Last menstrual period 07/01/2018.  Physical Exam  Constitutional: She is oriented to person, place, and time. She appears well-developed and well-nourished. No distress.  HENT:  Head: Normocephalic and atraumatic.  Eyes: Conjunctivae are normal.  Neck: Normal range of motion.   Cardiovascular: Normal rate, regular rhythm and normal heart sounds.  Respiratory: Effort normal and breath sounds normal. No respiratory distress.  GI: Soft. Bowel sounds are increased. There is no abdominal tenderness.  Genitourinary:    Vaginal discharge present.     No vaginal bleeding.  No bleeding in the vagina.    Genitourinary Comments: Speculum Exam: -Vaginal Vault: Pink mucosa.  Moderate amt thick white discharge in vault -wet prep collected -Cervix:Pink, no lesions, cysts, or polyps.  Appears slightly open. No active bleeding from os-GC/CT collected -Bimanual Exam: Closed at internal os/FT at external   Musculoskeletal: Normal range of motion.  Neurological: She is alert and oriented to person, place, and time.  Skin: Skin is warm and dry.  Psychiatric: She has a normal mood and affect. Her behavior is normal.    Fetal Assessment 145 bpm, Mod Var, -Decels, +10x10 Accels Toco: Irregular  MAU Course   Results for orders placed or performed during the hospital encounter of 01/31/19 (from the past 24 hour(s))  Urinalysis, Routine w reflex microscopic     Status: Abnormal   Collection Time: 01/31/19 10:57 PM  Result Value Ref Range   Color, Urine YELLOW YELLOW   APPearance HAZY (A) CLEAR   Specific Gravity, Urine 1.018 1.005 - 1.030   pH 7.0 5.0 - 8.0   Glucose, UA NEGATIVE NEGATIVE mg/dL   Hgb urine dipstick NEGATIVE NEGATIVE   Bilirubin Urine NEGATIVE NEGATIVE   Ketones, ur NEGATIVE NEGATIVE mg/dL   Protein, ur 30 (A) NEGATIVE mg/dL   Nitrite NEGATIVE NEGATIVE   Leukocytes,Ua SMALL (A) NEGATIVE   RBC / HPF 0-5 0 - 5 RBC/hpf   WBC, UA 0-5 0 - 5 WBC/hpf   Bacteria, UA NONE SEEN NONE SEEN   Squamous Epithelial / LPF 0-5 0 - 5   Mucus PRESENT    Hyaline Casts, UA PRESENT   Fetal fibronectin     Status: None   Collection Time: 02/01/19 12:46 AM  Result Value Ref Range   Fetal Fibronectin NEGATIVE NEGATIVE  Wet prep, genital     Status: Abnormal   Collection Time:  02/01/19 12:46 AM  Result Value Ref Range   Yeast Wet Prep HPF POC NONE SEEN NONE SEEN   Trich, Wet Prep NONE SEEN NONE SEEN   Clue Cells Wet Prep HPF POC NONE SEEN NONE SEEN   WBC, Wet Prep HPF POC MANY (A) NONE SEEN   Sperm NONE SEEN    No results found.  MDM  PE Labs: fFN, Wet Prep, GC/CT EFM Assessment and Plan  23 years old G1P0  SIUP at 30.5weeks Cat I FT Abdominal Cramping Diarrhea Contractions-Preterm  -Exam findings discussed. -Informed that cervix is closed, but able to get fingertip through os and halfway up canal. -Informed that fFN would be sent to r/o risk for PTL and ensure that contractions are likely related to diarrhea and not vice versa. -Patient verbalized understanding and without questions or concerns. -Cultures sent -Will give Imodium 4mg  now. -Will await results  Follow Up (1:32 AM) Acute Diarrhea  -fFN Negative -Wet prep returns with insignificant findings. -Results discussed with patient. -Informed that GC/CT will return within 2-3 days. -Patient reports no diarrhea since imodium dosing. -Patient reports cramping has improved as well.  -Reassured that symptoms most likely related to oral intake or contact with viral gastroenteritis.  -Discussed hand hygiene, bland diet, hydration, and rest while overcoming symptoms. -Instructed that imodium could be obtained OTC if diarrhea returns. -Pregnancy safe medication sheet given. -No questions or concerns. -NST reactive. -Encouraged to call or return to MAU if symptoms worsen or with the onset of new symptoms. -Discharged to home in improved condition.   Cherre RobinsJessica L Karyme Mcconathy MSN, CNM 02/01/2019, 12:19 AM

## 2019-02-09 ENCOUNTER — Encounter: Payer: Self-pay | Admitting: Certified Nurse Midwife

## 2019-02-09 ENCOUNTER — Telehealth: Payer: Self-pay | Admitting: Certified Nurse Midwife

## 2019-02-09 ENCOUNTER — Telehealth: Payer: Medicaid Other | Admitting: Certified Nurse Midwife

## 2019-02-09 ENCOUNTER — Other Ambulatory Visit: Payer: Self-pay

## 2019-02-09 DIAGNOSIS — Z3480 Encounter for supervision of other normal pregnancy, unspecified trimester: Secondary | ICD-10-CM

## 2019-02-09 NOTE — Telephone Encounter (Signed)
Attempted to call patient to get her reschedule for her missed ob appointment. No answer, left voicemail for patient to give the office a call back so we can get her rescheduled. No show letter mailed.

## 2019-02-09 NOTE — Progress Notes (Signed)
11:26a-Called pt for My Chart visit, no answer, left VM that ill call back in 10 to 15,minutes.   11:38-2nd attempt, no answer, will need to reschedule.Left VM.

## 2019-02-10 ENCOUNTER — Other Ambulatory Visit: Payer: Self-pay

## 2019-02-10 ENCOUNTER — Telehealth: Payer: Medicaid Other | Admitting: Obstetrics and Gynecology

## 2019-02-15 ENCOUNTER — Telehealth (INDEPENDENT_AMBULATORY_CARE_PROVIDER_SITE_OTHER): Payer: Medicare Other | Admitting: Advanced Practice Midwife

## 2019-02-15 ENCOUNTER — Other Ambulatory Visit: Payer: Self-pay

## 2019-02-15 ENCOUNTER — Encounter: Payer: Self-pay | Admitting: Advanced Practice Midwife

## 2019-02-15 VITALS — BP 104/65 | HR 88

## 2019-02-15 DIAGNOSIS — Z3A32 32 weeks gestation of pregnancy: Secondary | ICD-10-CM | POA: Diagnosis not present

## 2019-02-15 DIAGNOSIS — Z3483 Encounter for supervision of other normal pregnancy, third trimester: Secondary | ICD-10-CM | POA: Diagnosis not present

## 2019-02-15 DIAGNOSIS — Z3480 Encounter for supervision of other normal pregnancy, unspecified trimester: Secondary | ICD-10-CM

## 2019-02-15 NOTE — Progress Notes (Signed)
   TELEHEALTH VIRTUAL OBSTETRICS VISIT ENCOUNTER NOTE  I connected with Lori Weaver on 02/15/19 at  3:15 PM EDT by telephone at home and verified that I am speaking with the correct person using two identifiers.   I discussed the limitations, risks, security and privacy concerns of performing an evaluation and management service by telephone and the availability of in person appointments. I also discussed with the patient that there may be a patient responsible charge related to this service. The patient expressed understanding and agreed to proceed.  Subjective:  Lori Weaver is a 23 y.o. G1P0 at [redacted]w[redacted]d being followed for ongoing prenatal care.  She is currently monitored for the following issues for this low-risk pregnancy and has Family history of lupus erythematosus; Depression; Acne vulgaris; Irritant contact dermatitis; Supervision of other normal pregnancy, antepartum; Anxiety; and Anemia in pregnancy on their problem list.  Patient reports no complaints. Reports fetal movement. Denies any contractions, bleeding or leaking of fluid.   The following portions of the patient's history were reviewed and updated as appropriate: allergies, current medications, past family history, past medical history, past social history, past surgical history and problem list.   Objective:   General:  Alert, oriented and cooperative.   Mental Status: Normal mood and affect perceived. Normal judgment and thought content.  Rest of physical exam deferred due to type of encounter  Assessment and Plan:  Pregnancy: G1P0 at [redacted]w[redacted]d 1. Supervision of other normal pregnancy, antepartum - GBS at next visit  - CBC; Future - Transfer from [place] only   Preterm labor symptoms and general obstetric precautions including but not limited to vaginal bleeding, contractions, leaking of fluid and fetal movement were reviewed in detail with the patient.  I discussed the assessment and treatment plan with the  patient. The patient was provided an opportunity to ask questions and all were answered. The patient agreed with the plan and demonstrated an understanding of the instructions. The patient was advised to call back or seek an in-person office evaluation/go to MAU at [facility] for any urgent or concerning symptoms. Please refer to After Visit Summary for other counseling recommendations.   I provided 15 minutes of non-face-to-face time during this encounter.  Return in about 4 weeks (around 03/15/2019) for in person visit, 36 week cultures .  No future appointments.  Marcille Buffy DNP, CNM  02/15/19  3:17 PM  [facility]

## 2019-02-15 NOTE — Progress Notes (Signed)
2:59p-Clld pt for My chart visit, no answer, left VM stating will retry closer to appointment time.   2nd attempt, pt answered.

## 2019-03-14 ENCOUNTER — Other Ambulatory Visit: Payer: Self-pay

## 2019-03-14 ENCOUNTER — Inpatient Hospital Stay (HOSPITAL_COMMUNITY)
Admission: AD | Admit: 2019-03-14 | Discharge: 2019-03-15 | Disposition: A | Discharge status: Home / Self Care | Payer: Medicare Other | Attending: Obstetrics & Gynecology | Admitting: Obstetrics & Gynecology

## 2019-03-14 ENCOUNTER — Encounter (HOSPITAL_COMMUNITY): Payer: Self-pay

## 2019-03-14 DIAGNOSIS — F419 Anxiety disorder, unspecified: Secondary | ICD-10-CM | POA: Diagnosis not present

## 2019-03-14 DIAGNOSIS — R102 Pelvic and perineal pain: Secondary | ICD-10-CM | POA: Insufficient documentation

## 2019-03-14 DIAGNOSIS — Z3A36 36 weeks gestation of pregnancy: Secondary | ICD-10-CM | POA: Insufficient documentation

## 2019-03-14 DIAGNOSIS — Z818 Family history of other mental and behavioral disorders: Secondary | ICD-10-CM | POA: Diagnosis not present

## 2019-03-14 DIAGNOSIS — O26893 Other specified pregnancy related conditions, third trimester: Secondary | ICD-10-CM | POA: Diagnosis not present

## 2019-03-14 DIAGNOSIS — Z3689 Encounter for other specified antenatal screening: Secondary | ICD-10-CM | POA: Diagnosis not present

## 2019-03-14 DIAGNOSIS — Z91013 Allergy to seafood: Secondary | ICD-10-CM | POA: Insufficient documentation

## 2019-03-14 DIAGNOSIS — Z79899 Other long term (current) drug therapy: Secondary | ICD-10-CM | POA: Diagnosis not present

## 2019-03-14 DIAGNOSIS — F329 Major depressive disorder, single episode, unspecified: Secondary | ICD-10-CM | POA: Insufficient documentation

## 2019-03-14 DIAGNOSIS — R8271 Bacteriuria: Secondary | ICD-10-CM | POA: Insufficient documentation

## 2019-03-14 DIAGNOSIS — O99343 Other mental disorders complicating pregnancy, third trimester: Secondary | ICD-10-CM | POA: Diagnosis not present

## 2019-03-14 DIAGNOSIS — N898 Other specified noninflammatory disorders of vagina: Secondary | ICD-10-CM

## 2019-03-14 LAB — URINALYSIS, ROUTINE W REFLEX MICROSCOPIC
Bilirubin Urine: NEGATIVE
Glucose, UA: NEGATIVE mg/dL
Hgb urine dipstick: NEGATIVE
Ketones, ur: NEGATIVE mg/dL
Nitrite: NEGATIVE
Protein, ur: NEGATIVE mg/dL
Specific Gravity, Urine: 1.015 (ref 1.005–1.030)
pH: 7 (ref 5.0–8.0)

## 2019-03-14 LAB — WET PREP, GENITAL
Clue Cells Wet Prep HPF POC: NONE SEEN
Sperm: NONE SEEN
Trich, Wet Prep: NONE SEEN
Yeast Wet Prep HPF POC: NONE SEEN

## 2019-03-14 MED ORDER — CYCLOBENZAPRINE HCL 10 MG PO TABS
10.0000 mg | ORAL_TABLET | Freq: Once | ORAL | Status: AC
  Administered 2019-03-14: 10 mg via ORAL
  Filled 2019-03-14: qty 1

## 2019-03-14 NOTE — MAU Note (Signed)
Pt reports to MAU c/o pelvic pain that has been "ongoing for a while but worsened today." pt reports a clear/white vaginal discharge that started a few weeks ago. Pt reports +FM. No bleeding and pt reports some braxton hicks.

## 2019-03-14 NOTE — MAU Provider Note (Signed)
History     CSN: [id number]  Arrival date and time: 03/14/19 2053   First Provider Initiated Contact with Patient 03/14/19 2251      Chief Complaint  Patient presents with  . Pelvic Pain    Lori Weaver is a 23 y.o. G1P0 at [redacted]w[redacted]d who receives care at [facility].  She presents today for Pelvic Pain.  She states she has been having pelvic pain "for a couple weeks" and it has gotten progressively worse.  She states that she takes tylenol with relief for a couple of hours, but the pain soon returns. She also reports some increased vaginal discharge that is clear or white. She denies vaginal odor, burning, itching, bleeding, or leaking and is agreeable to testing.       OB History    Gravida  1   Para      Term      Preterm      AB      Living        SAB      TAB      Ectopic      Multiple      Live Births              Past Medical History:  Diagnosis Date  . Allergy   . Anemia   . Anxiety   . Chlamydia   . Depression   . Dysmenorrhea   . Gonorrhea     Past Surgical History:  Procedure Laterality Date  . LAPAROSCOPY N/A 05/06/2017   Procedure: LAPAROSCOPY DIAGNOSTIC;  Surgeon: Osborne Oman, MD;  Location: [place];  Service: Gynecology;  Laterality: N/A;  . LAPAROSCOPY ABDOMEN DIAGNOSTIC    . TONSILLECTOMY AND ADENOIDECTOMY    . TYMPANOSTOMY TUBE PLACEMENT Bilateral   . WISDOM TOOTH EXTRACTION      Family History  Problem Relation Age of Onset  . Lupus Mother   . Depression Mother   . Anxiety disorder Mother   . Migraines Mother   . Lupus Maternal Grandmother     Social History   Tobacco Use  . Smoking status: Never Smoker  . Smokeless tobacco: Never Used  Substance Use Topics  . Alcohol use: Not Currently  . Drug use: Not Currently    Allergies:  Allergies  Allergen Reactions  . Fish Allergy Anaphylaxis  . Soap Rash    Use for Pre-op    Medications Prior to Admission  Medication Sig Dispense Refill Last  Dose  . buPROPion (WELLBUTRIN XL) 150 MG 24 hr tablet Take 1 tablet (150 mg total) by mouth daily. 30 tablet 2 03/14/2019 at Unknown time  . acetaminophen (TYLENOL) 500 MG tablet Take 500 mg by mouth every 6 (six) hours as needed.     . docusate calcium (SURFAK) 240 MG capsule Take 1 capsule (240 mg total) by mouth daily. (Patient not taking: Reported on 02/15/2019) 30 capsule 2   . Elastic Bandages & Supports (COMFORT FIT MATERNITY SUPP SM) MISC 1 Units by Does not apply route daily as needed. (Patient not taking: Reported on 02/15/2019) 1 each 0   . Ferrous Fumarate (HEMOCYTE) 324 (106 Fe) MG TABS tablet Take 1 tablet (106 mg of iron total) by mouth 2 (two) times daily. (Patient not taking: Reported on 02/15/2019) 60 tablet 3   . penicillin v potassium (VEETID) 500 MG tablet TK 1 T PO Q 6 H     . Prenatal Vit-Fe Fumarate-FA (PRENATAL MULTIVITAMIN) TABS tablet Take 1  tablet by mouth daily at 12 noon.       Review of Systems  Constitutional: Negative for chills and fever.  Respiratory: Negative for cough and shortness of breath.   Gastrointestinal: Positive for abdominal pain (Cramping). Negative for constipation, diarrhea, nausea and vomiting.  Genitourinary: Positive for pelvic pain and vaginal discharge. Negative for difficulty urinating, dysuria, vaginal bleeding and vaginal pain.  Musculoskeletal: Positive for back pain.  Neurological: Positive for headaches. Negative for dizziness and light-headedness.   Physical Exam   Blood pressure 129/72, pulse (!) 108, temperature 98.8 F (37.1 C), temperature source Oral, resp. rate 16, weight 64 kg, last menstrual period 07/01/2018.  Physical Exam  Constitutional: She is oriented to person, place, and time. She appears well-developed and well-nourished.  HENT:  Head: Normocephalic and atraumatic.  Eyes: Conjunctivae are normal.  Neck: Normal range of motion.  Cardiovascular: Normal rate, regular rhythm and normal heart sounds.  Respiratory:  Effort normal.  GI: Soft. There is abdominal tenderness.  Genitourinary:    Genitourinary Comments: Normal External Female Genitalia  Thin white discharge apparent at introitus.  Cultures collected via Blind Swab  Dilation: 2 Effacement (%): 50 Exam by:: Gerrit HeckJessica Herchel Hopkin, CNM    Musculoskeletal: Normal range of motion.  Neurological: She is alert and oriented to person, place, and time.  Skin: Skin is warm and dry.  Psychiatric: She has a normal mood and affect. Her behavior is normal.    Fetal Assessment 145 bpm, Mod Var, -Decels, +Accels Toco: Q2-274min, palpates mild  MAU Course   Results for orders placed or performed during the hospital encounter of 03/14/19 (from the past 24 hour(s))  Urinalysis, Routine w reflex microscopic     Status: Abnormal   Collection Time: 03/14/19  9:51 PM  Result Value Ref Range   Color, Urine YELLOW YELLOW   APPearance HAZY (A) CLEAR   Specific Gravity, Urine 1.015 1.005 - 1.030   pH 7.0 5.0 - 8.0   Glucose, UA NEGATIVE NEGATIVE mg/dL   Hgb urine dipstick NEGATIVE NEGATIVE   Bilirubin Urine NEGATIVE NEGATIVE   Ketones, ur NEGATIVE NEGATIVE mg/dL   Protein, ur NEGATIVE NEGATIVE mg/dL   Nitrite NEGATIVE NEGATIVE   Leukocytes,Ua LARGE (A) NEGATIVE   RBC / HPF 11-20 0 - 5 RBC/hpf   WBC, UA 11-20 0 - 5 WBC/hpf   Bacteria, UA MANY (A) NONE SEEN   Squamous Epithelial / LPF 11-20 0 - 5   Mucus PRESENT   Wet prep, genital     Status: Abnormal   Collection Time: 03/14/19 11:15 PM   Specimen: Vaginal  Result Value Ref Range   Yeast Wet Prep HPF POC NONE SEEN NONE SEEN   Trich, Wet Prep NONE SEEN NONE SEEN   Clue Cells Wet Prep HPF POC NONE SEEN NONE SEEN   WBC, Wet Prep HPF POC MANY (A) NONE SEEN   Sperm NONE SEEN    No results found.  MDM PE Labs:UA, GC/CT, Wet Prep EFM  Assessment and Plan  23 year old G1P0  SIUP at 36.4weeks Cat I FT Pelvic pain  -Exam findings discussed. -Informed of bacteria in urine; will send for culture.    -Offered completion of GBS today and change in next office visit to virtual visit.  -Patient declines GBS culture stating that she needs to go to the office for completion of paper work. -Cultures collected. -Discussed cervical exam findings.  Patient expresses excitement with progress.  -Flexeril 10mg  now. -NST Reactive -Will reassess.   Shanda BumpsJessica  Fraser DinL Anoushka Divito MSN, CNM 03/14/2019, 10:52 PM   Reassessment (12:30 AM)  -Wet prep returns with insignificant findings. -Patient sitting upright in bed. -Patient reports improvement in pain with flexeril dosing. -Results discussed with patient. -Informed that GC/CT will return within 2-3 days. -Offered and accept script for home dosing.  Rx sent to pharmacy on file.  -Labor Precautions discussed.  -Encouraged to call or return to MAU if symptoms worsen or with the onset of new symptoms. -Discharged to home in improved condition.  Cherre RobinsJessica L Bensyn Bornemann MSN, CNM

## 2019-03-15 ENCOUNTER — Telehealth: Payer: Self-pay | Admitting: Medical

## 2019-03-15 DIAGNOSIS — Z3A36 36 weeks gestation of pregnancy: Secondary | ICD-10-CM

## 2019-03-15 DIAGNOSIS — O26893 Other specified pregnancy related conditions, third trimester: Secondary | ICD-10-CM

## 2019-03-15 DIAGNOSIS — R102 Pelvic and perineal pain: Secondary | ICD-10-CM

## 2019-03-15 DIAGNOSIS — Z3689 Encounter for other specified antenatal screening: Secondary | ICD-10-CM

## 2019-03-15 MED ORDER — CYCLOBENZAPRINE HCL 10 MG PO TABS: 10.0000 mg | ORAL_TABLET | Freq: Two times a day (BID) | ORAL | 0 refills | Status: DC | PRN

## 2019-03-15 NOTE — Telephone Encounter (Signed)
Patient stated she is returning a call she received about her appointment on 8/26 @ 1:15. Patient instructed to wear a face mask and no visitors are allowed. Patient screened for covid symptoms and denied having any.

## 2019-03-15 NOTE — Discharge Instructions (Signed)

## 2019-03-16 ENCOUNTER — Ambulatory Visit (INDEPENDENT_AMBULATORY_CARE_PROVIDER_SITE_OTHER): Payer: Medicaid Other | Admitting: Medical

## 2019-03-16 ENCOUNTER — Encounter: Payer: Self-pay | Admitting: Medical

## 2019-03-16 ENCOUNTER — Other Ambulatory Visit: Payer: Self-pay

## 2019-03-16 VITALS — BP 119/70 | HR 83 | Temp 98.6°F | Wt 139.8 lb

## 2019-03-16 DIAGNOSIS — Z3A36 36 weeks gestation of pregnancy: Secondary | ICD-10-CM

## 2019-03-16 DIAGNOSIS — Z3480 Encounter for supervision of other normal pregnancy, unspecified trimester: Secondary | ICD-10-CM

## 2019-03-16 DIAGNOSIS — O99013 Anemia complicating pregnancy, third trimester: Secondary | ICD-10-CM

## 2019-03-16 LAB — CBC
Hematocrit: 31.4 % — ABNORMAL LOW (ref 34.0–46.6)
Hemoglobin: 11 g/dL — ABNORMAL LOW (ref 11.1–15.9)
MCH: 30.5 pg (ref 26.6–33.0)
MCHC: 35 g/dL (ref 31.5–35.7)
MCV: 87 fL (ref 79–97)
Platelets: 257 10*3/uL (ref 150–450)
RBC: 3.61 x10E6/uL — ABNORMAL LOW (ref 3.77–5.28)
RDW: 11.1 % — ABNORMAL LOW (ref 11.7–15.4)
WBC: 7.7 10*3/uL (ref 3.4–10.8)

## 2019-03-16 LAB — CULTURE, OB URINE: Culture: NO GROWTH

## 2019-03-16 LAB — GC/CHLAMYDIA PROBE AMP (~~LOC~~) NOT AT ARMC
Chlamydia: NEGATIVE
Neisseria Gonorrhea: NEGATIVE

## 2019-03-16 NOTE — Progress Notes (Signed)
   PRENATAL VISIT NOTE  Subjective:  Lori Weaver is a 23 y.o. G1P0 at [redacted]w[redacted]d being seen today for ongoing prenatal care.  She is currently monitored for the following issues for this low-risk pregnancy and has Family history of lupus erythematosus; Depression; Acne vulgaris; Irritant contact dermatitis; Supervision of other normal pregnancy, antepartum; Anxiety; and Anemia in pregnancy on their problem list.  Patient reports fatigue and occasional contractions.  Contractions: Irritability. Vag. Bleeding: None.  Movement: Present. Denies leaking of fluid.   The following portions of the patient's history were reviewed and updated as appropriate: allergies, current medications, past family history, past medical history, past social history, past surgical history and problem list.   Objective:   Vitals:   03/16/19 1320  BP: 119/70  Pulse: 83  Temp: 98.6 F (37 C)  Weight: 139 lb 12.8 oz (63.4 kg)    Fetal Status: Fetal Heart Rate (bpm): 140 Fundal Height: 36 cm Movement: Present  Presentation: Vertex  General:  Alert, oriented and cooperative. Patient is in no acute distress.  Skin: Skin is warm and dry. No rash noted.   Cardiovascular: Normal heart rate noted  Respiratory: Normal respiratory effort, no problems with respiration noted  Abdomen: Soft, gravid, appropriate for gestational age.  Pain/Pressure: Present     Pelvic: Cervical exam performed Dilation: 2 Effacement (%): 50 Station: -3  Extremities: Normal range of motion.  Edema: None  Mental Status: Normal mood and affect. Normal behavior. Normal judgment and thought content.   Assessment and Plan:  Pregnancy: G1P0 at [redacted]w[redacted]d 1. Supervision of other normal pregnancy, antepartum - Culture, beta strep (group b only) - GC/Chlamydia negative last week in MAU - Discussed patient's birth plan: desires CNM if possible, wants to try to deliver on hands and knees, wants to avoid IOL and epidural, desires delayed cord clamping - Advised  to request CNM if available - Information about cervical ripening with EPO given for 39 weeks  2. Anemia during pregnancy in third trimester - CBC  Preterm labor symptoms and general obstetric precautions including but not limited to vaginal bleeding, contractions, leaking of fluid and fetal movement were reviewed in detail with the patient. Please refer to After Visit Summary for other counseling recommendations.   Return in about 2 weeks (around 03/30/2019) for LOB with CNM, Virtual.  No future appointments.  Kerry Hough, PA-C

## 2019-03-16 NOTE — Patient Instructions (Addendum)
Fetal Movement Counts °Patient Name: ________________________________________________ Patient Due Date: ____________________ °What is a fetal movement count? ° °A fetal movement count is the number of times that you feel your baby move during a certain amount of time. This may also be called a fetal kick count. A fetal movement count is recommended for every pregnant woman. You may be asked to start counting fetal movements as early as week 28 of your pregnancy. °Pay attention to when your baby is most active. You may notice your baby's sleep and wake cycles. You may also notice things that make your baby move more. You should do a fetal movement count: °· When your baby is normally most active. °· At the same time each day. °A good time to count movements is while you are resting, after having something to eat and drink. °How do I count fetal movements? °1. Find a quiet, comfortable area. Sit, or lie down on your side. °2. Write down the date, the start time and stop time, and the number of movements that you felt between those two times. Take this information with you to your health care visits. °3. For 2 hours, count kicks, flutters, swishes, rolls, and jabs. You should feel at least 10 movements during 2 hours. °4. You may stop counting after you have felt 10 movements. °5. If you do not feel 10 movements in 2 hours, have something to eat and drink. Then, keep resting and counting for 1 hour. If you feel at least 4 movements during that hour, you may stop counting. °Contact a health care provider if: °· You feel fewer than 4 movements in 2 hours. °· Your baby is not moving like he or she usually does. °Date: ____________ Start time: ____________ Stop time: ____________ Movements: ____________ °Date: ____________ Start time: ____________ Stop time: ____________ Movements: ____________ °Date: ____________ Start time: ____________ Stop time: ____________ Movements: ____________ °Date: ____________ Start time:  ____________ Stop time: ____________ Movements: ____________ °Date: ____________ Start time: ____________ Stop time: ____________ Movements: ____________ °Date: ____________ Start time: ____________ Stop time: ____________ Movements: ____________ °Date: ____________ Start time: ____________ Stop time: ____________ Movements: ____________ °Date: ____________ Start time: ____________ Stop time: ____________ Movements: ____________ °Date: ____________ Start time: ____________ Stop time: ____________ Movements: ____________ °This information is not intended to replace advice given to you by your health care provider. Make sure you discuss any questions you have with your health care provider. °Document Released: 08/06/2006 Document Revised: 07/27/2018 Document Reviewed: 08/16/2015 °Elsevier Patient Education © 2020 Elsevier Inc. °Braxton Hicks Contractions °Contractions of the uterus can occur throughout pregnancy, but they are not always a sign that you are in labor. You may have practice contractions called Braxton Hicks contractions. These false labor contractions are sometimes confused with true labor. °What are Braxton Hicks contractions? °Braxton Hicks contractions are tightening movements that occur in the muscles of the uterus before labor. Unlike true labor contractions, these contractions do not result in opening (dilation) and thinning of the cervix. Toward the end of pregnancy (32-34 weeks), Braxton Hicks contractions can happen more often and may become stronger. These contractions are sometimes difficult to tell apart from true labor because they can be very uncomfortable. You should not feel embarrassed if you go to the hospital with false labor. °Sometimes, the only way to tell if you are in true labor is for your health care provider to look for changes in the cervix. The health care provider will do a physical exam and may monitor your contractions. If you   are not in true labor, the exam should show  that your cervix is not dilating and your water has not broken. If there are no other health problems associated with your pregnancy, it is completely safe for you to be sent home with false labor. You may continue to have Braxton Hicks contractions until you go into true labor. How to tell the difference between true labor and false labor True labor  Contractions last 30-70 seconds.  Contractions become very regular.  Discomfort is usually felt in the top of the uterus, and it spreads to the lower abdomen and low back.  Contractions do not go away with walking.  Contractions usually become more intense and increase in frequency.  The cervix dilates and gets thinner. False labor  Contractions are usually shorter and not as strong as true labor contractions.  Contractions are usually irregular.  Contractions are often felt in the front of the lower abdomen and in the groin.  Contractions may go away when you walk around or change positions while lying down.  Contractions get weaker and are shorter-lasting as time goes on.  The cervix usually does not dilate or become thin. Follow these instructions at home:   Take over-the-counter and prescription medicines only as told by your health care provider.  Keep up with your usual exercises and follow other instructions from your health care provider.  Eat and drink lightly if you think you are going into labor.  If Braxton Hicks contractions are making you uncomfortable: ? Change your position from lying down or resting to walking, or change from walking to resting. ? Sit and rest in a tub of warm water. ? Drink enough fluid to keep your urine pale yellow. Dehydration may cause these contractions. ? Do slow and deep breathing several times an hour.  Keep all follow-up prenatal visits as told by your health care provider. This is important. Contact a health care provider if:  You have a fever.  You have continuous pain in  your abdomen. Get help right away if:  Your contractions become stronger, more regular, and closer together.  You have fluid leaking or gushing from your vagina.  You pass blood-tinged mucus (bloody show).  You have bleeding from your vagina.  You have low back pain that you never had before.  You feel your babys head pushing down and causing pelvic pressure.  Your baby is not moving inside you as much as it used to. Summary  Contractions that occur before labor are called Braxton Hicks contractions, false labor, or practice contractions.  Braxton Hicks contractions are usually shorter, weaker, farther apart, and less regular than true labor contractions. True labor contractions usually become progressively stronger and regular, and they become more frequent.  Manage discomfort from [facility] contractions by changing position, resting in a warm bath, drinking plenty of water, or practicing deep breathing. This information is not intended to replace advice given to you by your health care provider. Make sure you discuss any questions you have with your health care provider. Document Released: 11/20/2016 Document Revised: 06/19/2017 Document Reviewed: 11/20/2016 Elsevier Patient Education  2020 [employer].   Cervical Ripening: May try one or both  Red Raspberry Leaf capsules:  two 300mg  or 400mg  tablets with each meal, 2-3 times a day  Potential Side Effects Of Raspberry Leaf:  Most women do not experience any side effects from drinking raspberry leaf tea. However, nausea and loose stools are possible   Evening Primrose Oil capsules:  may take 1 to 3 capsules daily. May also prick one to release the oil and insert it into your vagina at night.  Some of the potential side effects:  Upset stomach  Loose stools or diarrhea  Headaches  Nausea

## 2019-03-18 ENCOUNTER — Telehealth: Payer: Self-pay | Admitting: Obstetrics & Gynecology

## 2019-03-18 NOTE — Telephone Encounter (Signed)
Called the patient and left a detailed voicemail message regarding the new ap

## 2019-03-20 LAB — CULTURE, BETA STREP (GROUP B ONLY): Strep Gp B Culture: NEGATIVE

## 2019-03-22 ENCOUNTER — Telehealth: Payer: Self-pay | Admitting: Obstetrics and Gynecology

## 2019-03-22 NOTE — Telephone Encounter (Signed)
Called the patient to inform of changes in the appointment due to an change in the office. The appointment is scheduled for a day sooner. Left a detailed voicemail message.

## 2019-03-23 ENCOUNTER — Telehealth: Payer: Self-pay | Admitting: General Practice

## 2019-03-23 ENCOUNTER — Telehealth: Payer: Self-pay | Admitting: Student

## 2019-03-23 ENCOUNTER — Telehealth: Payer: Self-pay | Admitting: Obstetrics and Gynecology

## 2019-03-23 NOTE — Telephone Encounter (Signed)
Lori Weaver was returning a call back from the nurse.

## 2019-03-23 NOTE — Telephone Encounter (Signed)
Attempted to call patient about needing to change her appointment, and why. Left a message for her to give Lori Weaver a call so we could explain the need for the change.

## 2019-03-23 NOTE — Telephone Encounter (Signed)
Called patient stating I am returning her phone call. She states she wanted to know what her GBS results were. Informed her of negative results. Patient verbalized understanding & had no questions.

## 2019-03-23 NOTE — Telephone Encounter (Signed)
Received message from front office staff that the patient was requesting a call back for results. Called patient, no answer- left message stating we are trying to reach you to return your phone call, please call us back.

## 2019-03-28 ENCOUNTER — Encounter (HOSPITAL_COMMUNITY): Payer: Self-pay

## 2019-03-28 ENCOUNTER — Other Ambulatory Visit: Payer: Self-pay

## 2019-03-28 ENCOUNTER — Inpatient Hospital Stay (HOSPITAL_COMMUNITY)
Admission: AD | Admit: 2019-03-28 | Discharge: 2019-03-29 | Disposition: A | Discharge status: Home / Self Care | Payer: Medicare Other | Attending: Obstetrics and Gynecology | Admitting: Obstetrics and Gynecology

## 2019-03-28 DIAGNOSIS — O471 False labor at or after 37 completed weeks of gestation: Secondary | ICD-10-CM

## 2019-03-28 DIAGNOSIS — Z3480 Encounter for supervision of other normal pregnancy, unspecified trimester: Secondary | ICD-10-CM

## 2019-03-28 DIAGNOSIS — Z3A38 38 weeks gestation of pregnancy: Secondary | ICD-10-CM | POA: Insufficient documentation

## 2019-03-28 NOTE — MAU Note (Signed)
Pt here with abdominal pain, decreased fetal movement. Denies any bleeding or leaking

## 2019-03-29 ENCOUNTER — Encounter: Payer: Medicaid Other | Admitting: Student

## 2019-03-29 DIAGNOSIS — Z3A38 38 weeks gestation of pregnancy: Secondary | ICD-10-CM

## 2019-03-29 DIAGNOSIS — O471 False labor at or after 37 completed weeks of gestation: Secondary | ICD-10-CM | POA: Diagnosis not present

## 2019-03-29 DIAGNOSIS — Z029 Encounter for administrative examinations, unspecified: Secondary | ICD-10-CM

## 2019-03-29 NOTE — MAU Note (Signed)
I have communicated with Derrill Memo, CNM and reviewed vital signs:  Vitals:   03/28/19 2256  BP: 135/71  Pulse: (!) 103  Resp: 18  Temp: 98.3 F (36.8 C)  SpO2: 100%    Vaginal exam:  Dilation: 2 Effacement (%): Thick Cervical Position: Posterior Station: -3 Presentation: Vertex Exam by:: Denyse Dago, RN,   Also reviewed contraction pattern and that non-stress test is reactive.  It has been documented that patient is contracting every 7-15 minutes withno cervical change over 1 hours not indicating active labor.  Patient denies any other complaints.  Based on this report provider has given order for discharge.  A discharge order and diagnosis entered by a provider.   Labor discharge instructions reviewed with patient.

## 2019-03-29 NOTE — Discharge Instructions (Signed)
Braxton Hicks Contractions Contractions of the uterus can occur throughout pregnancy, but they are not always a sign that you are in labor. You may have practice contractions called Braxton Hicks contractions. These false labor contractions are sometimes confused with true labor. What are Braxton Hicks contractions? Braxton Hicks contractions are tightening movements that occur in the muscles of the uterus before labor. Unlike true labor contractions, these contractions do not result in opening (dilation) and thinning of the cervix. Toward the end of pregnancy (32-34 weeks), Braxton Hicks contractions can happen more often and may become stronger. These contractions are sometimes difficult to tell apart from true labor because they can be very uncomfortable. You should not feel embarrassed if you go to the hospital with false labor. Sometimes, the only way to tell if you are in true labor is for your health care provider to look for changes in the cervix. The health care provider will do a physical exam and may monitor your contractions. If you are not in true labor, the exam should show that your cervix is not dilating and your water has not broken. If there are no other health problems associated with your pregnancy, it is completely safe for you to be sent home with false labor. You may continue to have Braxton Hicks contractions until you go into true labor. How to tell the difference between true labor and false labor True labor  Contractions last 30-70 seconds.  Contractions become very regular.  Discomfort is usually felt in the top of the uterus, and it spreads to the lower abdomen and low back.  Contractions do not go away with walking.  Contractions usually become more intense and increase in frequency.  The cervix dilates and gets thinner. False labor  Contractions are usually shorter and not as strong as true labor contractions.  Contractions are usually irregular.  Contractions  are often felt in the front of the lower abdomen and in the groin.  Contractions may go away when you walk around or change positions while lying down.  Contractions get weaker and are shorter-lasting as time goes on.  The cervix usually does not dilate or become thin. Follow these instructions at home:   Take over-the-counter and prescription medicines only as told by your health care provider.  Keep up with your usual exercises and follow other instructions from your health care provider.  Eat and drink lightly if you think you are going into labor.  If Braxton Hicks contractions are making you uncomfortable: ? Change your position from lying down or resting to walking, or change from walking to resting. ? Sit and rest in a tub of warm water. ? Drink enough fluid to keep your urine pale yellow. Dehydration may cause these contractions. ? Do slow and deep breathing several times an hour.  Keep all follow-up prenatal visits as told by your health care provider. This is important. Contact a health care provider if:  You have a fever.  You have continuous pain in your abdomen. Get help right away if:  Your contractions become stronger, more regular, and closer together.  You have fluid leaking or gushing from your vagina.  You pass blood-tinged mucus (bloody show).  You have bleeding from your vagina.  You have low back pain that you never had before.  You feel your baby's head pushing down and causing pelvic pressure.  Your baby is not moving inside you as much as it used to. Summary  Contractions that occur before labor are   called Braxton Hicks contractions, false labor, or practice contractions.  Braxton Hicks contractions are usually shorter, weaker, farther apart, and less regular than true labor contractions. True labor contractions usually become progressively stronger and regular, and they become more frequent.  Manage discomfort from Braxton Hicks contractions  by changing position, resting in a warm bath, drinking plenty of water, or practicing deep breathing. This information is not intended to replace advice given to you by your health care provider. Make sure you discuss any questions you have with your health care provider. Document Released: 11/20/2016 Document Revised: 06/19/2017 Document Reviewed: 11/20/2016 Elsevier Patient Education  2020 Elsevier Inc.  

## 2019-03-29 NOTE — MAU Provider Note (Signed)
Lori Weaver is a 23 y.o. G1P0 female at [redacted]w[redacted]d  RN Labor check, not seen by provider SVE by RN: Dilation: 2 Effacement (%): Thick Station: -3 Exam by:: Denyse Dago, RN- unchanged after 1 hr NST: FHR baseline 135-140 bpm, Variability: moderate, Accelerations:present, Decelerations:  Absent= Cat 1/Reactive Toco: irregular, every 5-15 minutes with interspersed irritability  D/C home Keep next OB f/u  Myrtis Ser [facility] 03/29/2019 12:43 AM

## 2019-03-30 ENCOUNTER — Encounter: Payer: Medicaid Other | Admitting: Obstetrics and Gynecology

## 2019-04-04 ENCOUNTER — Ambulatory Visit (INDEPENDENT_AMBULATORY_CARE_PROVIDER_SITE_OTHER): Payer: Medicaid Other | Admitting: Advanced Practice Midwife

## 2019-04-04 ENCOUNTER — Encounter: Payer: Self-pay | Admitting: Advanced Practice Midwife

## 2019-04-04 ENCOUNTER — Telehealth: Payer: Medicaid Other | Admitting: Advanced Practice Midwife

## 2019-04-04 ENCOUNTER — Other Ambulatory Visit: Payer: Self-pay

## 2019-04-04 VITALS — BP 130/82 | HR 101 | Wt 150.4 lb

## 2019-04-04 DIAGNOSIS — Z3483 Encounter for supervision of other normal pregnancy, third trimester: Secondary | ICD-10-CM

## 2019-04-04 DIAGNOSIS — Z3A39 39 weeks gestation of pregnancy: Secondary | ICD-10-CM

## 2019-04-04 DIAGNOSIS — Z3480 Encounter for supervision of other normal pregnancy, unspecified trimester: Secondary | ICD-10-CM

## 2019-04-04 NOTE — Progress Notes (Signed)
   PRENATAL VISIT NOTE  Subjective:  Lori Weaver is a 23 y.o. G1P0 at [redacted]w[redacted]d being seen today for ongoing prenatal care.  She is currently monitored for the following issues for this low-risk pregnancy and has Family history of lupus erythematosus; Depression; Acne vulgaris; Irritant contact dermatitis; Supervision of other normal pregnancy, antepartum; Anxiety; and Anemia in pregnancy on their problem list.  Patient reports no complaints.  Contractions: Irregular. Vag. Bleeding: Bloody Show.  Movement: Present. Denies leaking of fluid.   The following portions of the patient's history were reviewed and updated as appropriate: allergies, current medications, past family history, past medical history, past social history, past surgical history and problem list.   Objective:   Vitals:   04/04/19 1017  BP: 130/82  Pulse: (!) 101  Weight: 150 lb 6.4 oz (68.2 kg)    Fetal Status: Fetal Heart Rate (bpm): 145 Fundal Height: 39 cm Movement: Present  Presentation: Vertex  General:  Alert, oriented and cooperative. Patient is in no acute distress.  Skin: Skin is warm and dry. No rash noted.   Cardiovascular: Normal heart rate noted  Respiratory: Normal respiratory effort, no problems with respiration noted  Abdomen: Soft, gravid, appropriate for gestational age.  Pain/Pressure: Present     Pelvic: Cervical exam performed Dilation: 2 Effacement (%): 60 Station: -2  Extremities: Normal range of motion.  Edema: None  Mental Status: Normal mood and affect. Normal behavior. Normal judgment and thought content.   Assessment and Plan:  Pregnancy: G1P0 at [redacted]w[redacted]d 1. Supervision of other normal pregnancy, antepartum - Routine care - Post dates BPP/NST next week - IOL scheduled for 04/14/2019, orders placed.  Term labor symptoms and general obstetric precautions including but not limited to vaginal bleeding, contractions, leaking of fluid and fetal movement were reviewed in detail with the patient.  Please refer to After Visit Summary for other counseling recommendations.   Return in about 1 week (around 04/11/2019) for Post dates BPP/NST .  Future Appointments  Date Time Provider [place]  04/14/2019  8:30 AM [facility] [place] None    Marcille Buffy DNP, CNM  04/04/19  10:44 AM

## 2019-04-05 ENCOUNTER — Telehealth (HOSPITAL_COMMUNITY): Payer: Self-pay | Admitting: *Deleted

## 2019-04-05 NOTE — Telephone Encounter (Signed)
Preadmission screen  

## 2019-04-06 ENCOUNTER — Telehealth (HOSPITAL_COMMUNITY): Payer: Self-pay | Admitting: *Deleted

## 2019-04-06 NOTE — Telephone Encounter (Signed)
Preadmission screen  

## 2019-04-07 ENCOUNTER — Telehealth (HOSPITAL_COMMUNITY): Payer: Self-pay | Admitting: *Deleted

## 2019-04-07 NOTE — Telephone Encounter (Signed)
Preadmission screen  

## 2019-04-08 ENCOUNTER — Telehealth (HOSPITAL_COMMUNITY): Payer: Self-pay | Admitting: *Deleted

## 2019-04-08 ENCOUNTER — Other Ambulatory Visit: Payer: Self-pay

## 2019-04-08 ENCOUNTER — Inpatient Hospital Stay (HOSPITAL_COMMUNITY)
Admission: AD | Admit: 2019-04-08 | Discharge: 2019-04-09 | Disposition: A | Discharge status: Home / Self Care | Payer: Medicaid Other | Source: Home / Self Care | Attending: Obstetrics & Gynecology | Admitting: Obstetrics & Gynecology

## 2019-04-08 DIAGNOSIS — O479 False labor, unspecified: Secondary | ICD-10-CM

## 2019-04-08 NOTE — Telephone Encounter (Signed)
Preadmission screen  

## 2019-04-09 ENCOUNTER — Inpatient Hospital Stay (HOSPITAL_COMMUNITY)
Admission: AD | Admit: 2019-04-09 | Discharge: 2019-04-12 | DRG: 768 | Disposition: A | Discharge status: Home / Self Care | Payer: Medicaid Other | Attending: Obstetrics and Gynecology | Admitting: Obstetrics and Gynecology

## 2019-04-09 ENCOUNTER — Other Ambulatory Visit: Payer: Self-pay

## 2019-04-09 ENCOUNTER — Encounter (HOSPITAL_COMMUNITY): Payer: Self-pay

## 2019-04-09 DIAGNOSIS — D649 Anemia, unspecified: Secondary | ICD-10-CM | POA: Diagnosis present

## 2019-04-09 DIAGNOSIS — O480 Post-term pregnancy: Secondary | ICD-10-CM

## 2019-04-09 DIAGNOSIS — O9902 Anemia complicating childbirth: Secondary | ICD-10-CM | POA: Diagnosis present

## 2019-04-09 DIAGNOSIS — Z3A40 40 weeks gestation of pregnancy: Secondary | ICD-10-CM | POA: Diagnosis not present

## 2019-04-09 DIAGNOSIS — Z20828 Contact with and (suspected) exposure to other viral communicable diseases: Secondary | ICD-10-CM | POA: Diagnosis present

## 2019-04-09 DIAGNOSIS — Z3480 Encounter for supervision of other normal pregnancy, unspecified trimester: Secondary | ICD-10-CM

## 2019-04-09 HISTORY — DX: Post-term pregnancy: O48.0

## 2019-04-09 LAB — CBC
HCT: 39.8 % (ref 36.0–46.0)
Hemoglobin: 12.9 g/dL (ref 12.0–15.0)
MCH: 30.9 pg (ref 26.0–34.0)
MCHC: 32.4 g/dL (ref 30.0–36.0)
MCV: 95.2 fL (ref 80.0–100.0)
Platelets: 286 10*3/uL (ref 150–400)
RBC: 4.18 MIL/uL (ref 3.87–5.11)
RDW: 13.2 % (ref 11.5–15.5)
WBC: 13.8 10*3/uL — ABNORMAL HIGH (ref 4.0–10.5)
nRBC: 0 % (ref 0.0–0.2)

## 2019-04-09 LAB — TYPE AND SCREEN
ABO/RH(D): B POS
Antibody Screen: NEGATIVE

## 2019-04-09 LAB — SARS CORONAVIRUS 2 BY RT PCR (HOSPITAL ORDER, PERFORMED IN ~~LOC~~ HOSPITAL LAB): SARS Coronavirus 2: NEGATIVE

## 2019-04-09 LAB — ABO/RH: ABO/RH(D): B POS

## 2019-04-09 MED ORDER — PHENYLEPHRINE 40 MCG/ML (10ML) SYRINGE FOR IV PUSH (FOR BLOOD PRESSURE SUPPORT): 80.0000 ug | PREFILLED_SYRINGE | INTRAVENOUS | Status: DC | PRN

## 2019-04-09 MED ORDER — TERBUTALINE SULFATE 1 MG/ML IJ SOLN: 0.2500 mg | Freq: Once | INTRAMUSCULAR | Status: DC | PRN

## 2019-04-09 MED ORDER — OXYTOCIN 40 UNITS IN NORMAL SALINE INFUSION - SIMPLE MED
1.0000 m[IU]/min | INTRAVENOUS | Status: DC
  Administered 2019-04-09: 2 m[IU]/min via INTRAVENOUS
  Administered 2019-04-10: 6 m[IU]/min via INTRAVENOUS
  Administered 2019-04-10: 4 m[IU]/min via INTRAVENOUS

## 2019-04-09 MED ORDER — FENTANYL CITRATE (PF) 100 MCG/2ML IJ SOLN
100.0000 ug | INTRAMUSCULAR | Status: DC | PRN
  Administered 2019-04-10 (×2): 100 ug via INTRAVENOUS
  Filled 2019-04-09 (×2): qty 2

## 2019-04-09 MED ORDER — OXYTOCIN 40 UNITS IN NORMAL SALINE INFUSION - SIMPLE MED: 2.5000 [IU]/h | INTRAVENOUS | Status: DC

## 2019-04-09 MED ORDER — FENTANYL-BUPIVACAINE-NACL 0.5-0.125-0.9 MG/250ML-% EP SOLN
12.0000 mL/h | EPIDURAL | Status: DC | PRN
  Administered 2019-04-10: 12 mL/h via EPIDURAL
  Filled 2019-04-09 (×2): qty 250

## 2019-04-09 MED ORDER — OXYTOCIN 40 UNITS IN NORMAL SALINE INFUSION - SIMPLE MED
1.0000 m[IU]/min | INTRAVENOUS | Status: DC
  Filled 2019-04-09: qty 1000

## 2019-04-09 MED ORDER — OXYTOCIN BOLUS FROM INFUSION
500.0000 mL | Freq: Once | INTRAVENOUS | Status: AC
  Administered 2019-04-11: 500 mL via INTRAVENOUS

## 2019-04-09 MED ORDER — DIPHENHYDRAMINE HCL 50 MG/ML IJ SOLN: 12.5000 mg | INTRAMUSCULAR | Status: DC | PRN

## 2019-04-09 MED ORDER — LACTATED RINGERS IV SOLN
500.0000 mL | INTRAVENOUS | Status: DC | PRN
  Administered 2019-04-10: 07:00:00 500 mL via INTRAVENOUS
  Administered 2019-04-11: 1000 mL via INTRAVENOUS

## 2019-04-09 MED ORDER — LIDOCAINE HCL (PF) 1 % IJ SOLN: 30.0000 mL | INTRAMUSCULAR | Status: DC | PRN

## 2019-04-09 MED ORDER — LACTATED RINGERS IV SOLN
500.0000 mL | Freq: Once | INTRAVENOUS | Status: AC
  Administered 2019-04-10: 500 mL via INTRAVENOUS

## 2019-04-09 MED ORDER — SOD CITRATE-CITRIC ACID 500-334 MG/5ML PO SOLN: 30.0000 mL | ORAL | Status: DC | PRN

## 2019-04-09 MED ORDER — OXYCODONE-ACETAMINOPHEN 5-325 MG PO TABS: 2.0000 | ORAL_TABLET | ORAL | Status: DC | PRN

## 2019-04-09 MED ORDER — EPHEDRINE 5 MG/ML INJ: 10.0000 mg | INTRAVENOUS | Status: DC | PRN

## 2019-04-09 MED ORDER — LACTATED RINGERS IV SOLN
INTRAVENOUS | Status: DC
  Administered 2019-04-09 – 2019-04-10 (×5): via INTRAVENOUS

## 2019-04-09 MED ORDER — OXYCODONE-ACETAMINOPHEN 5-325 MG PO TABS: 1.0000 | ORAL_TABLET | ORAL | Status: DC | PRN

## 2019-04-09 MED ORDER — OXYCODONE-ACETAMINOPHEN 5-325 MG PO TABS
1.0000 | ORAL_TABLET | Freq: Once | ORAL | Status: AC
  Administered 2019-04-09: 1 via ORAL
  Filled 2019-04-09: qty 1

## 2019-04-09 MED ORDER — ACETAMINOPHEN 325 MG PO TABS: 650.0000 mg | ORAL_TABLET | ORAL | Status: DC | PRN

## 2019-04-09 MED ORDER — ONDANSETRON HCL 4 MG/2ML IJ SOLN: 4.0000 mg | Freq: Four times a day (QID) | INTRAMUSCULAR | Status: DC | PRN

## 2019-04-09 MED ORDER — MISOPROSTOL 50MCG HALF TABLET: 50.0000 ug | ORAL_TABLET | ORAL | Status: DC | PRN

## 2019-04-09 NOTE — H&P (Signed)
OBSTETRIC ADMISSION HISTORY AND PHYSICAL  Lori Weaver is a 23 y.o. female G1P0 with IUP at [redacted]w[redacted]d by LMP presenting for spontaneous onset of labor and variables on FHR. She reports +FMs, No LOF, no VB, no blurry vision, headaches or peripheral edema, and RUQ pain.  She plans on breast feeding. She request nothing for birth control. She received her prenatal care at [facility]   Dating: By LMP --->  Estimated Date of Delivery: 04/07/19   Nursing Staff Provider  Office Location  [place] Dating  LMP, consistent with early [place]  Language  English Anatomy US  Normal  Flu Vaccine  Declined-09/15/2018 Genetic Screen  NIPS: low risk, female  AFP:  Screen negative   TDaP vaccine    Hgb A1C or  GTT Early  Third trimester 1 hour 62  Rhogam  NA   LAB RESULTS   Feeding Plan Breast Blood Type B/Positive/-- (02/26 0000)   Contraception None Antibody Negative (02/26 0000)   Circumcision Undecided Rubella 7.53 (02/26 0000)  Pediatrician  List given RPR Nonreactive (06/18 0000)   Support Person Nick(FOB) HBsAg Negative (02/26 0000)   Prenatal Classes Info given HIV Non-reactive (06/18 0000)  BTL Consent NA GBS   Negative  VBAC Consent NA Pap  Normal    Hgb Electro   Normal    CF  Negative    SMA  Negative    Waterbirth  [ ]  Class [ ]  Consent [ ]  CNM visit   Prenatal History/Complications:  Past Medical History: Past Medical History:  Diagnosis Date  . Allergy   . Anemia   . Anxiety   . Chlamydia   . Depression   . Dysmenorrhea   . Gonorrhea     Past Surgical History: Past Surgical History:  Procedure Laterality Date  . LAPAROSCOPY N/A 05/06/2017   Procedure: LAPAROSCOPY DIAGNOSTIC;  Surgeon: Osborne Oman, MD;  Location: [place];  Service: Gynecology;  Laterality: N/A;  . LAPAROSCOPY ABDOMEN DIAGNOSTIC    . TONSILLECTOMY AND ADENOIDECTOMY    . TYMPANOSTOMY TUBE PLACEMENT Bilateral   . WISDOM TOOTH EXTRACTION      Obstetrical History: OB History    Gravida  1   Para       Term      Preterm      AB      Living        SAB      TAB      Ectopic      Multiple      Live Births              Social History Social History   Socioeconomic History  . Marital status: Single    Spouse name: Not on file  . Number of children: Not on file  . Years of education: Not on file  . Highest education level: Not on file  Occupational History  . Not on file  Social Needs  . Financial resource strain: Not on file  . Food insecurity    Worry: Never true    Inability: Never true  . Transportation needs    Medical: No    Non-medical: No  Tobacco Use  . Smoking status: Never Smoker  . Smokeless tobacco: Never Used  Substance and Sexual Activity  . Alcohol use: Not Currently  . Drug use: Not Currently  . Sexual activity: Yes    Birth control/protection: None  Lifestyle  . Physical activity    Days per week: Not  on file    Minutes per session: Not on file  . Stress: Not on file  Relationships  . Social Musicianconnections    Talks on phone: Not on file    Gets together: Not on file    Attends religious service: Not on file    Active member of club or organization: Not on file    Attends meetings of clubs or organizations: Not on file    Relationship status: Not on file  Other Topics Concern  . 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  Plans to get a masters degree.  Plans to go to PA school.    Family History: Family History  Problem Relation Age of Onset  . Lupus Mother   . Depression Mother   . Anxiety disorder Mother   . Migraines Mother   . Lupus Maternal Grandmother     Allergies: Allergies  Allergen Reactions  . Fish Allergy Anaphylaxis  . Soap Rash    Use for Pre-op    Medications Prior to Admission  Medication Sig Dispense Refill Last Dose  . acetaminophen (TYLENOL) 500 MG tablet Take 500 mg by mouth every 6 (six) hours as needed.     Marland Kitchen.  buPROPion (WELLBUTRIN XL) 150 MG 24 hr tablet Take 1 tablet (150 mg total) by mouth daily. 30 tablet 2   . cyclobenzaprine (FLEXERIL) 10 MG tablet Take 1 tablet (10 mg total) by mouth 2 (two) times daily as needed for muscle spasms. 20 tablet 0   . Elastic Bandages & Supports (COMFORT FIT MATERNITY SUPP SM) MISC 1 Units by Does not apply route daily as needed. (Patient not taking: Reported on 02/15/2019) 1 each 0   . Ferrous Fumarate (HEMOCYTE) 324 (106 Fe) MG TABS tablet Take 1 tablet (106 mg of iron total) by mouth 2 (two) times daily. 60 tablet 3   . Prenatal Vit-Fe Fumarate-FA (PRENATAL MULTIVITAMIN) TABS tablet Take 1 tablet by mouth daily at 12 noon.        Review of Systems   All systems reviewed and negative except as stated in HPI  Blood pressure 124/74, pulse (!) 106, temperature 98.3 F (36.8 C), temperature source Oral, resp. rate 17, last menstrual period 07/01/2018, SpO2 100 %. General appearance: alert, cooperative and no distress Lungs: clear to auscultation bilaterally Heart: regular rate and rhythm Abdomen: soft, non-tender; bowel sounds normal Pelvic: n/a Extremities: Homans sign is negative, no sign of DVT DTR's +2 Presentation: cephalic Fetal monitoringBaseline: 140 bpm, Variability: Good {> 6 bpm), Accelerations: Reactive and Decelerations: Absent Uterine activityFrequency: Every 5-6 minutes Dilation: 4 Effacement (%): 70 Station: -2 Exam by:: Janeth Rasehristina Robinson RN   Prenatal labs: ABO, Rh: B/Positive/-- (02/26 0000) Antibody: Negative (02/26 0000) Rubella: 7.53 (02/26 0000) RPR: Nonreactive (06/18 0000)  HBsAg: Negative (02/26 0000)  HIV: Non-reactive (06/18 0000)  GBS: Negative/-- (08/26 1342)   Prenatal Transfer Tool  Maternal Diabetes: No Genetic Screening: Normal Maternal Ultrasounds/Referrals: Normal Fetal Ultrasounds or other Referrals:  None Maternal Substance Abuse:  No Significant Maternal Medications:  None Significant Maternal Lab  Results: Group B Strep negative  No results found for this or any previous visit (from the past 24 hour(s)).  Patient Active Problem List   Diagnosis Date Noted  . Post-dates pregnancy 04/09/2019  . Anemia in pregnancy 01/26/2019  . Supervision of other normal pregnancy, antepartum 09/15/2018  . Anxiety 09/15/2018  . Irritant contact dermatitis 01/19/2018  .  Family history of lupus erythematosus 12/02/2017  . Depression 12/02/2017  . Acne vulgaris 12/02/2017    Assessment/Plan:  Lori Weaver is a 23 y.o. G1P0 at [redacted]w[redacted]d here for spontaneous onset of labor  #Labor: FHR showed one variable while in MAU and patient rating pain 10/10. Patient is a transfer from [place] [facility] and desires low intervention birth with midwife if possible.   Agreeable to saline lock but no IV fluids. Discussed importance of hydration in labor.   Requesting intermittent monitoring. FHR monitoring reactive for >1hr with no more decelerations. Will do intermittent monitoring to encourage patient to walk around.  Declining cervical exams and augmentation at this time. Will do expectant management and discussed reasons for augmentation including fetal and maternal indications.   Adamant about not pushing on her back and wants to deliver on all fours.   Desires delayed cord clamping x1 hour. Does not want lotus birth, but does not want to cut cord until the one hour is up. Discussed normal cord perfusion and timing of cord pulsation. Patient verbalized understanding but desire to wait 1 hour.    #Pain: Considering epidural #FWB: Cat 1 #ID:  GBS neg #MOF: Breast #MOC: none #Circ:  n/a  Rolm Bookbinder, CNM  04/09/2019, 3:15 PM

## 2019-04-09 NOTE — Progress Notes (Signed)
Labor Progress Note Lori Weaver is a 23 y.o. G1P0 at [redacted]w[redacted]d admitted for decels  S:  Feeling ctx q5 min, coping well. No LOF. Good FM. +bloody show.   O:  BP 131/76   Pulse (!) 103   Temp 98.6 F (37 C) (Oral)   Resp 16   Ht 4\' 10"  (1.473 m)   Wt 69.4 kg   LMP 07/01/2018   SpO2 100% Comment: ra  BMI 31.98 kg/m  EFM: baseline 135 bpm/ mod variability/ + accels/ no decels  Toco: q5-7 SVE: 4.5/80/-2, intact  A/P: 23 y.o. G1P0 [redacted]w[redacted]d  1. Post dates  2. Labor: none 3. FWB: Cat I 4. Pain: analgesia/anesthesia prn  No signs of active labor. Recommend Pitocin induction, pt agrees. Anticipate labor progression and SVD.  Julianne Handler, CNM 11:21 PM

## 2019-04-09 NOTE — MAU Note (Signed)
I have communicated with Dr. Posey Pronto  and reviewed vital signs:  Vitals:   04/09/19 0015 04/09/19 0254  BP: 128/78 122/73  Pulse: (!) 109 91  Resp: 16 16  Temp: 98.4 F (36.9 C) 98.2 F (36.8 C)    Vaginal exam:  Dilation: 2.5 Effacement (%): 70 Cervical Position: Posterior Station: -2 Presentation: Vertex Exam by:: Navy Rothschild Production designer, theatre/television/film,   Also reviewed contraction pattern and that non-stress test is reactive.  It has been documented that patient is contracting every 6 minutes with no cervical change over 2 hours not indicating active labor.  Patient denies any other complaints.  Based on this report provider has given order for discharge.  A discharge order and diagnosis entered by a provider.   Labor discharge instructions reviewed with patient.

## 2019-04-09 NOTE — Discharge Instructions (Signed)
Braxton Hicks Contractions Contractions of the uterus can occur throughout pregnancy, but they are not always a sign that you are in labor. You may have practice contractions called Braxton Hicks contractions. These false labor contractions are sometimes confused with true labor. What are Braxton Hicks contractions? Braxton Hicks contractions are tightening movements that occur in the muscles of the uterus before labor. Unlike true labor contractions, these contractions do not result in opening (dilation) and thinning of the cervix. Toward the end of pregnancy (32-34 weeks), Braxton Hicks contractions can happen more often and may become stronger. These contractions are sometimes difficult to tell apart from true labor because they can be very uncomfortable. You should not feel embarrassed if you go to the hospital with false labor. Sometimes, the only way to tell if you are in true labor is for your health care provider to look for changes in the cervix. The health care provider will do a physical exam and may monitor your contractions. If you are not in true labor, the exam should show that your cervix is not dilating and your water has not broken. If there are no other health problems associated with your pregnancy, it is completely safe for you to be sent home with false labor. You may continue to have Braxton Hicks contractions until you go into true labor. How to tell the difference between true labor and false labor True labor  Contractions last 30-70 seconds.  Contractions become very regular.  Discomfort is usually felt in the top of the uterus, and it spreads to the lower abdomen and low back.  Contractions do not go away with walking.  Contractions usually become more intense and increase in frequency.  The cervix dilates and gets thinner. False labor  Contractions are usually shorter and not as strong as true labor contractions.  Contractions are usually irregular.  Contractions  are often felt in the front of the lower abdomen and in the groin.  Contractions may go away when you walk around or change positions while lying down.  Contractions get weaker and are shorter-lasting as time goes on.  The cervix usually does not dilate or become thin. Follow these instructions at home:   Take over-the-counter and prescription medicines only as told by your health care provider.  Keep up with your usual exercises and follow other instructions from your health care provider.  Eat and drink lightly if you think you are going into labor.  If Braxton Hicks contractions are making you uncomfortable: ? Change your position from lying down or resting to walking, or change from walking to resting. ? Sit and rest in a tub of warm water. ? Drink enough fluid to keep your urine pale yellow. Dehydration may cause these contractions. ? Do slow and deep breathing several times an hour.  Keep all follow-up prenatal visits as told by your health care provider. This is important. Contact a health care provider if:  You have a fever.  You have continuous pain in your abdomen. Get help right away if:  Your contractions become stronger, more regular, and closer together.  You have fluid leaking or gushing from your vagina.  You pass blood-tinged mucus (bloody show).  You have bleeding from your vagina.  You have low back pain that you never had before.  You feel your baby's head pushing down and causing pelvic pressure.  Your baby is not moving inside you as much as it used to. Summary  Contractions that occur before labor are   called Braxton Hicks contractions, false labor, or practice contractions.  Braxton Hicks contractions are usually shorter, weaker, farther apart, and less regular than true labor contractions. True labor contractions usually become progressively stronger and regular, and they become more frequent.  Manage discomfort from Braxton Hicks contractions  by changing position, resting in a warm bath, drinking plenty of water, or practicing deep breathing. This information is not intended to replace advice given to you by your health care provider. Make sure you discuss any questions you have with your health care provider. Document Released: 11/20/2016 Document Revised: 06/19/2017 Document Reviewed: 11/20/2016 Elsevier Patient Education  2020 Elsevier Inc.  

## 2019-04-09 NOTE — MAU Note (Signed)
Lori Weaver is a 23 y.o. at [redacted]w[redacted]d here in MAU reporting:  +contractions; awoken from sleep +vaginal bleeding ; started after being checked last night and has continued Onset of complaint: 11am Pain score: 10/10 Vitals:   04/09/19 1430  BP: 124/74  Pulse: (!) 106  Resp: 17  SpO2: 100%     FHT: 155 Lab orders placed from triage: mau labor

## 2019-04-09 NOTE — Progress Notes (Signed)
Patient states contractions are worsening. Sitting upright in bed watching movie on computer, no apparent distress. FHR tracing reactive during intermittent monitoring. Declines cervical exam or augmentation at this time.   Wende Mott, CNM 04/09/19 6:46 PM

## 2019-04-09 NOTE — MAU Note (Signed)
Patient desiring natural birth. States will do a saline lock but at present does not want to be connected to fluids. Patient has requested IV be put in [facility]. RN placed 20G IV in Left AC salined locked per patient.

## 2019-04-10 ENCOUNTER — Encounter (HOSPITAL_COMMUNITY): Payer: Self-pay | Admitting: *Deleted

## 2019-04-10 ENCOUNTER — Inpatient Hospital Stay (HOSPITAL_COMMUNITY): Payer: Medicaid Other | Admitting: Anesthesiology

## 2019-04-10 LAB — RPR: RPR Ser Ql: NONREACTIVE

## 2019-04-10 MED ORDER — SODIUM CHLORIDE (PF) 0.9 % IJ SOLN
INTRAMUSCULAR | Status: DC | PRN
  Administered 2019-04-10: 12 mL/h via EPIDURAL

## 2019-04-10 MED ORDER — LIDOCAINE HCL (PF) 1 % IJ SOLN
INTRAMUSCULAR | Status: DC | PRN
  Administered 2019-04-10 (×2): 5 mL via EPIDURAL

## 2019-04-10 MED ORDER — BUPROPION HCL ER (XL) 150 MG PO TB24
150.0000 mg | ORAL_TABLET | Freq: Every day | ORAL | Status: DC
  Administered 2019-04-10: 150 mg via ORAL
  Filled 2019-04-10: qty 1

## 2019-04-10 NOTE — Progress Notes (Signed)
Lori Weaver is a 23 y.o. G1P0 at [redacted]w[redacted]d admitted for active labor  Subjective: Pt comfortable with epidural. S/O at bedside for support.  Objective: BP 116/74   Pulse 88   Temp 98.5 F (36.9 C) (Oral)   Resp 16   Ht 4\' 10"  (1.473 m)   Wt 69.4 kg   LMP 07/01/2018   SpO2 99%   BMI 31.98 kg/m  I/O last 3 completed shifts: In: -  Out: 450 [Urine:450] No intake/output data recorded.  FHT:  FHR: 135 bpm, variability: moderate,  accelerations:  Present,  decelerations:  Present short period of lates/variables, resolved with reduced rate of Pitocin/repositioning UC:   regular, every 2 minutes SVE:   Dilation: 8 Effacement (%): 90 Station: Plus 1 Exam by:: [name], CNM  Labs: Lab Results  Component Value Date   WBC 13.8 (H) 04/09/2019   HGB 12.9 04/09/2019   HCT 39.8 04/09/2019   MCV 95.2 04/09/2019   PLT 286 04/09/2019    Assessment / Plan: Augmentation of labor, progressing well  Labor: Progressing normally Preeclampsia:  n/a Fetal Wellbeing:  Now Category I  Pain Control:  Epidural I/D:  GBS neg Anticipated MOD:  NSVD  Fatima Blank 04/10/2019, 6:06 PM

## 2019-04-10 NOTE — Progress Notes (Signed)
Labor Progress Note Lori Weaver is a 23 y.o. G1P0 at [redacted]w[redacted]d presented for decels  S:  Comfortable with epidural. No c/o.  O:  BP 120/85   Pulse (!) 109   Temp 98.3 F (36.8 C) (Oral)   Resp 16   Ht 4\' 10"  (1.473 m)   Wt 69.4 kg   LMP 07/01/2018   SpO2 100% Comment: ra  BMI 31.98 kg/m  EFM: baseline 130 bpm/ mod variability/ + accels/ no decels  Toco: 2-3 SVE: 6/80/-1 Pitocin: 4 mu/min GU: foley to SD, dark amber  A/P: 23 y.o. G1P0 [redacted]w[redacted]d  1. Labor: protracted 2. FWB: Cat I 3. Pain: epidural  Will improve hydration and perfusion with IVF bolus. Increase Pitocin. Consider AROM if no change in a few hrs. Anticipate SVD.  Julianne Handler, CNM 6:51 AM

## 2019-04-10 NOTE — Progress Notes (Signed)
Lori Weaver is a 23 y.o. G1P0 at [redacted]w[redacted]d admitted for active labor  Subjective: Pt comfortable with epidural.  S/O at bedside for support.  Objective: BP 139/86   Pulse 100   Temp 98.7 F (37.1 C) (Oral)   Resp 20   Ht 4\' 10"  (1.473 m)   Wt 69.4 kg   LMP 07/01/2018   SpO2 99%   BMI 31.98 kg/m  I/O last 3 completed shifts: In: -  Out: 450 [Urine:450] No intake/output data recorded.  FHT:  FHR: 135 bpm, variability: moderate,  accelerations:  Present,  decelerations:  Absent UC:   regular, every 2 minutes SVE:   Dilation: 6 Effacement (%): 90 Station: Plus 1 Exam by:: [name], CNM AROM with clear fluid, bloody show noted on exam, IUPC placed without difficulty. Pt tolerated well.  Labs: Lab Results  Component Value Date   WBC 13.8 (H) 04/09/2019   HGB 12.9 04/09/2019   HCT 39.8 04/09/2019   MCV 95.2 04/09/2019   PLT 286 04/09/2019    Assessment / Plan: Augmentation of labor, progressing well  Labor: Progressing normally Preeclampsia:  labs stable Fetal Wellbeing:  Category I Pain Control:  Epidural I/D:  GBS neg Anticipated MOD:  NSVD  Lori Weaver 04/10/2019, 2:09 PM

## 2019-04-10 NOTE — Anesthesia Preprocedure Evaluation (Signed)
Anesthesia Evaluation  Patient identified by MRN, date of birth, ID band Patient awake    Reviewed: Allergy & Precautions, H&P , NPO status , Patient's Chart, lab work & pertinent test results  History of Anesthesia Complications Negative for: history of anesthetic complications  Airway Mallampati: II  TM Distance: >3 FB Neck ROM: full    Dental no notable dental hx. (+) Teeth Intact   Pulmonary neg pulmonary ROS,    Pulmonary exam normal breath sounds clear to auscultation       Cardiovascular negative cardio ROS Normal cardiovascular exam Rhythm:regular Rate:Normal     Neuro/Psych PSYCHIATRIC DISORDERS Anxiety Depression negative neurological ROS     GI/Hepatic negative GI ROS, Neg liver ROS,   Endo/Other  negative endocrine ROS  Renal/GU negative Renal ROS  negative genitourinary   Musculoskeletal   Abdominal (+) + obese,   Peds  Hematology negative hematology ROS (+)   Anesthesia Other Findings   Reproductive/Obstetrics (+) Pregnancy                             Anesthesia Physical Anesthesia Plan  ASA: II  Anesthesia Plan: Epidural   Post-op Pain Management:    Induction:   PONV Risk Score and Plan:   Airway Management Planned:   Additional Equipment:   Intra-op Plan:   Post-operative Plan:   Informed Consent: I have reviewed the patients History and Physical, chart, labs and discussed the procedure including the risks, benefits and alternatives for the proposed anesthesia with the patient or authorized representative who has indicated his/her understanding and acceptance.       Plan Discussed with:   Anesthesia Plan Comments:         Anesthesia Quick Evaluation

## 2019-04-10 NOTE — Anesthesia Procedure Notes (Signed)
Epidural Patient location during procedure: OB Start time: 04/10/2019 2:17 AM End time: 04/10/2019 2:27 AM  Staffing Anesthesiologist: Murvin Natal, MD Performed: anesthesiologist   Preanesthetic Checklist Completed: patient identified, site marked, pre-op evaluation, timeout performed, IV checked, risks and benefits discussed and monitors and equipment checked  Epidural Patient position: sitting Prep: DuraPrep Patient monitoring: heart rate, cardiac monitor, continuous pulse ox and blood pressure Approach: midline Location: L3-L4 Injection technique: LOR air  Needle:  Needle type: Tuohy  Needle gauge: 17 G Needle length: 9 cm Needle insertion depth: 6 cm Catheter type: closed end flexible Catheter size: 19 Gauge Catheter at skin depth: 11 cm Test dose: negative and Other  Assessment Events: blood not aspirated, injection not painful, no injection resistance and negative IV test  Additional Notes Informed consent obtained prior to proceeding including risk of failure, 1% risk of PDPH, risk of minor discomfort and bruising.  Discussed alternatives to epidural analgesia and patient desires to proceed.  Timeout performed pre-procedure verifying patient name, procedure, and platelet count.  Difficulty encounter while attempting to thread and bolus epidural catheter on the initial attempt. Able to thread and bolus catheter on the second attempt. Patient tolerated procedure well. Reason for block:procedure for pain

## 2019-04-10 NOTE — Progress Notes (Signed)
Labor Progress Note Lori Weaver is a 23 y.o. G1P0 at [redacted]w[redacted]d presented for active labor  S:  Patient sleeping  O:  BP 130/81   Pulse 96   Temp 98.5 F (36.9 C) (Oral)   Resp 20   Ht 4\' 10"  (1.473 m)   Wt 69.4 kg   LMP 07/01/2018   SpO2 99%   BMI 31.98 kg/m   Fetal Tracing:  Baseline: 125 Variability: moderate Accels: 15x15 Decels: none  Toco: 2-6   CVE: Dilation: 10 Dilation Complete Date: 04/10/19 Dilation Complete Time: 2236 Effacement (%): 100 Cervical Position: Middle Station: Plus 2, Plus 3 Presentation: Vertex Exam by:: Haynes Bast CNM   A&P: 23 y.o. G1P0 [redacted]w[redacted]d active labor #Labor: Progressing well. Will start pushing #Pain: epidural #FWB: Cat 1 #GBS negative  Wende Mott, CNM 11:16 PM

## 2019-04-11 ENCOUNTER — Other Ambulatory Visit: Payer: Medicaid Other

## 2019-04-11 ENCOUNTER — Encounter (HOSPITAL_COMMUNITY): Payer: Self-pay

## 2019-04-11 DIAGNOSIS — Z3A40 40 weeks gestation of pregnancy: Secondary | ICD-10-CM

## 2019-04-11 DIAGNOSIS — O480 Post-term pregnancy: Secondary | ICD-10-CM

## 2019-04-11 LAB — CBC
HCT: 35.3 % — ABNORMAL LOW (ref 36.0–46.0)
Hemoglobin: 11.7 g/dL — ABNORMAL LOW (ref 12.0–15.0)
MCH: 30.7 pg (ref 26.0–34.0)
MCHC: 33.1 g/dL (ref 30.0–36.0)
MCV: 92.7 fL (ref 80.0–100.0)
Platelets: 254 10*3/uL (ref 150–400)
RBC: 3.81 MIL/uL — ABNORMAL LOW (ref 3.87–5.11)
RDW: 12.8 % (ref 11.5–15.5)
WBC: 23.6 10*3/uL — ABNORMAL HIGH (ref 4.0–10.5)
nRBC: 0 % (ref 0.0–0.2)

## 2019-04-11 MED ORDER — IBUPROFEN 600 MG PO TABS
600.0000 mg | ORAL_TABLET | Freq: Four times a day (QID) | ORAL | Status: DC
  Administered 2019-04-11 – 2019-04-12 (×4): 600 mg via ORAL
  Filled 2019-04-11 (×4): qty 1

## 2019-04-11 MED ORDER — WITCH HAZEL-GLYCERIN EX PADS: 1.0000 "application " | MEDICATED_PAD | CUTANEOUS | Status: DC | PRN

## 2019-04-11 MED ORDER — METHYLERGONOVINE MALEATE 0.2 MG/ML IJ SOLN
0.2000 mg | Freq: Once | INTRAMUSCULAR | Status: AC
  Administered 2019-04-11: 01:00:00 0.2 mg via INTRAMUSCULAR

## 2019-04-11 MED ORDER — DIPHENHYDRAMINE HCL 25 MG PO CAPS: 25.0000 mg | ORAL_CAPSULE | Freq: Four times a day (QID) | ORAL | Status: DC | PRN

## 2019-04-11 MED ORDER — ACETAMINOPHEN 325 MG PO TABS
650.0000 mg | ORAL_TABLET | ORAL | Status: DC | PRN
  Administered 2019-04-11: 650 mg via ORAL
  Filled 2019-04-11: qty 2

## 2019-04-11 MED ORDER — DIBUCAINE (PERIANAL) 1 % EX OINT: 1.0000 "application " | TOPICAL_OINTMENT | CUTANEOUS | Status: DC | PRN

## 2019-04-11 MED ORDER — BUPROPION HCL ER (XL) 150 MG PO TB24
150.0000 mg | ORAL_TABLET | Freq: Every day | ORAL | Status: DC
  Administered 2019-04-11 – 2019-04-12 (×2): 150 mg via ORAL
  Filled 2019-04-11 (×3): qty 1

## 2019-04-11 MED ORDER — SIMETHICONE 80 MG PO CHEW: 80.0000 mg | CHEWABLE_TABLET | ORAL | Status: DC | PRN

## 2019-04-11 MED ORDER — IBUPROFEN 800 MG PO TABS
800.0000 mg | ORAL_TABLET | Freq: Once | ORAL | Status: DC
  Administered 2019-04-11: 800 mg via ORAL
  Filled 2019-04-11: qty 1

## 2019-04-11 MED ORDER — PRENATAL MULTIVITAMIN CH: 1.0000 | ORAL_TABLET | Freq: Every day | ORAL | Status: DC

## 2019-04-11 MED ORDER — ZOLPIDEM TARTRATE 5 MG PO TABS: 5.0000 mg | ORAL_TABLET | Freq: Every evening | ORAL | Status: DC | PRN

## 2019-04-11 MED ORDER — ONDANSETRON HCL 4 MG/2ML IJ SOLN: 4.0000 mg | INTRAMUSCULAR | Status: DC | PRN

## 2019-04-11 MED ORDER — COCONUT OIL OIL: 1.0000 "application " | TOPICAL_OIL | Status: DC | PRN

## 2019-04-11 MED ORDER — METHYLERGONOVINE MALEATE 0.2 MG/ML IJ SOLN
INTRAMUSCULAR | Status: AC
  Administered 2019-04-11: 0.2 mg via INTRAMUSCULAR
  Filled 2019-04-11: qty 1

## 2019-04-11 MED ORDER — SENNOSIDES-DOCUSATE SODIUM 8.6-50 MG PO TABS
2.0000 | ORAL_TABLET | ORAL | Status: DC
  Administered 2019-04-12: 2 via ORAL
  Filled 2019-04-11: qty 2

## 2019-04-11 MED ORDER — BENZOCAINE-MENTHOL 20-0.5 % EX AERO
1.0000 "application " | INHALATION_SPRAY | CUTANEOUS | Status: DC | PRN
  Administered 2019-04-11: 1 via TOPICAL
  Filled 2019-04-11: qty 56

## 2019-04-11 MED ORDER — ONDANSETRON HCL 4 MG PO TABS: 4.0000 mg | ORAL_TABLET | ORAL | Status: DC | PRN

## 2019-04-11 MED ORDER — TETANUS-DIPHTH-ACELL PERTUSSIS 5-2.5-18.5 LF-MCG/0.5 IM SUSP: 0.5000 mL | Freq: Once | INTRAMUSCULAR | Status: DC

## 2019-04-11 MED ORDER — OXYCODONE-ACETAMINOPHEN 5-325 MG PO TABS
1.0000 | ORAL_TABLET | Freq: Once | ORAL | Status: AC
  Administered 2019-04-11: 1 via ORAL
  Filled 2019-04-11: qty 1

## 2019-04-11 NOTE — Anesthesia Postprocedure Evaluation (Signed)
Anesthesia Post Note  Patient: Lori Weaver  Procedure(s) Performed: AN AD HOC LABOR EPIDURAL     Patient location during evaluation: Mother Baby Anesthesia Type: Epidural Level of consciousness: awake and alert and oriented Pain management: satisfactory to patient Vital Signs Assessment: post-procedure vital signs reviewed and stable Respiratory status: respiratory function stable Cardiovascular status: stable Postop Assessment: no headache, no backache, epidural receding, patient able to bend at knees, no signs of nausea or vomiting and adequate PO intake Anesthetic complications: no    Last Vitals:  Vitals:   04/11/19 0338 04/11/19 0435  BP: 132/84 133/84  Pulse: 81 96  Resp: 18 18  Temp: 37.3 C 37.2 C  SpO2: 100% 100%    Last Pain:  Vitals:   04/11/19 0720  TempSrc:   PainSc: 3    Pain Goal: Patients Stated Pain Goal: 0 (04/09/19 1429)                 [name]

## 2019-04-11 NOTE — Progress Notes (Signed)
Dr. Posey Pronto aware of fish allergy contained in PNV. Aware pt does not have her own PNV's with her in the hospital. Dr. Posey Pronto states pt does not need to take her PNV. Also, made Dr. Posey Pronto aware of pt pain level of 6 even after having motrin around 0300. Dr. Posey Pronto states to order a one time dose of percocet. Will continue to monitor.

## 2019-04-11 NOTE — Discharge Summary (Signed)
Postpartum Discharge Summary      Patient Name: Lori Weaver DOB: Jan 20, 1996 MRN: [id number]  Date of admission: 04/09/2019 Delivering Provider: Wende Mott   Date of discharge: 04/12/2019  Admitting diagnosis: CTX less than 4 vag bleeding Intrauterine pregnancy: [redacted]w[redacted]d    Secondary diagnosis:  Active Problems:   Post-dates pregnancy  Additional problems: n/a     Discharge diagnosis: Term Pregnancy Delivered                                                                                                Post partum procedures:n/a  Augmentation: AROM and Pitocin  Complications: None  Hospital course:  Onset of Labor With Vaginal Delivery     23y.o. yo G1P0 at 416w4das admitted in Latent Labor on 04/09/2019. Patient had an uncomplicated labor course as follows:  Membrane Rupture Time/Date: 2:00 PM ,04/11/2019   Intrapartum Procedures: Episiotomy: None [1]                                         Lacerations:  Periurethral [8];Cervical [7]  Patient had a delivery of a Viable infant. 04/11/2019  Information for the patient's newborn:  GrMariane, [place]0[[id number]]Delivery Method: Vaginal, Spontaneous(Filed from Delivery Summary)     Pateint had an uncomplicated postpartum course.  She is ambulating, tolerating a regular diet, passing flatus, and urinating well. Patient is discharged home in stable condition on 04/12/19.  Delivery time: 1:08 AM    Magnesium Sulfate received: No BMZ received: No Rhophylac:No MMR:No Transfusion:No  Physical exam  Vitals:   04/11/19 0830 04/11/19 1640 04/11/19 2243 04/12/19 0611  BP: 127/82 119/69 119/75 110/62  Pulse: 68 92 94 93  Resp: '18 18 20 18  '$ Temp: 98 F (36.7 C) 98.1 F (36.7 C) 97.9 F (36.6 C) 98.2 F (36.8 C)  TempSrc: Oral Oral Oral Oral  SpO2: 100%  100% 100%  Weight:      Height:       General: alert, cooperative and no distress Lochia: appropriate Uterine Fundus: firm Incision: N/A DVT Evaluation: No  evidence of DVT seen on physical exam. Negative Homan's sign. No cords or calf tenderness. Labs: Lab Results  Component Value Date   WBC 23.6 (H) 04/11/2019   HGB 11.7 (L) 04/11/2019   HCT 35.3 (L) 04/11/2019   MCV 92.7 04/11/2019   PLT 254 04/11/2019   CMP Latest Ref Rng & Units 12/29/2017  Glucose 65 - 99 mg/dL 84  BUN 6 - 20 mg/dL 5(L)  Creatinine 0.44 - 1.00 mg/dL 0.99  Sodium 135 - 145 mmol/L 136  Potassium 3.5 - 5.1 mmol/L 3.6  Chloride 101 - 111 mmol/L 104  CO2 22 - 32 mmol/L 23  Calcium 8.9 - 10.3 mg/dL 9.1  Total Protein 6.5 - 8.1 g/dL 6.7  Total Bilirubin 0.3 - 1.2 mg/dL 0.9  Alkaline Phos 38 - 126 U/L 62  AST 15 - 41 U/L 22  ALT 14 - 54 U/L 20  Discharge instruction: per After Visit Summary and "Baby and Me Booklet".  After visit meds:  Allergies as of 04/12/2019      Reactions   Fish Allergy Anaphylaxis   Soap Rash   Use for Pre-op      Medication List    STOP taking these medications   acetaminophen 500 MG tablet Commonly known as: [place] Supp Sm Misc   cyclobenzaprine 10 MG tablet Commonly known as: FLEXERIL     TAKE these medications   buPROPion 150 MG 24 hr tablet Commonly known as: WELLBUTRIN XL Take 1 tablet (150 mg total) by mouth daily.   Ferrous Fumarate 324 (106 Fe) MG Tabs tablet Commonly known as: Hemocyte Take 1 tablet (106 mg of iron total) by mouth 2 (two) times daily.   ibuprofen 600 MG tablet Commonly known as: ADVIL Take 1 tablet (600 mg total) by mouth every 6 (six) hours.   prenatal multivitamin Tabs tablet Take 1 tablet by mouth daily at 12 noon.       Diet: routine diet  Activity: Advance as tolerated. Pelvic rest for 6 weeks.   Outpatient follow up: Follow up Appt: Future Appointments  Date Time Provider [place]  05/12/2019  2:15 PM Rasch, Artist Pais, NP [facility]   Follow up Visit: [facility] for [facility] Follow up on  05/12/2019.   Specialty: Obstetrics and Gynecology Why: for postpartum checkup Contact information: [address], [place] [id number] [place] [id number] [phone]         Please schedule this patient for PP visit in: 4 weeks Low risk pregnancy complicated by: n/a Delivery mode:  SVD Anticipated Birth Control:  none PP Procedures needed: n/a  Schedule Integrated [place] visit: no Provider: Any provider  Newborn Data: Live born female  Birth Weight:   APGAR: 59, 71  Newborn Delivery   Birth date/time: 04/11/2019 01:08:00 Delivery type: Vaginal, Spontaneous      Baby Feeding: Breast Disposition:home with mother

## 2019-04-11 NOTE — Discharge Instructions (Signed)

## 2019-04-11 NOTE — Lactation Note (Signed)
This note was copied from a baby's chart. Lactation Consultation Note  Patient Name: Lori Weaver [id number] Date: 04/11/2019 Reason for consult: Initial assessment;Term;[place] Baby is 29 hours old and has not latched to breast.  Attempts have been made but baby sleepy.  Baby is currently sleeping in mom's arms.  Baby unwrapped and waking techniques done.  Assisted with positioning baby skin to skin in football hold on left.  Mom has compressible breast tissue and short everted nipples.  She knows how to hand express and a drop of colostrum seen.  Baby opened wide but no latch achieved.  Baby very sleepy at breast and not showing interest in feeding.  Discussed first 24 hour behavior and cluster feeding on days 2-3.  Instructed to watch for feeding cues and call for assist prn.  Breastfeeding consultation services information given and reviewed.  Maternal Data Has patient been taught Hand Expression?: Yes Does the patient have breastfeeding experience prior to this delivery?: No  Feeding Feeding Type: Breast Fed  LATCH Score Latch: Too sleepy or reluctant, no latch achieved, no sucking elicited.  Audible Swallowing: None  Type of Nipple: Everted at rest and after stimulation  Comfort (Breast/Nipple): Soft / non-tender  Hold (Positioning): Assistance needed to correctly position infant at breast and maintain latch.  LATCH Score: 5  Interventions Interventions: Breast compression;Breast feeding basics reviewed;Assisted with latch;Adjust position;Skin to skin;Support pillows;Breast massage;Position options;Hand express  Lactation Tools Discussed/Used     Consult Status Consult Status: Follow-up Date: 04/12/19 Follow-up type: In-patient    Ave Filter 04/11/2019, 10:30 AM

## 2019-04-11 NOTE — Progress Notes (Signed)
MOB was referred for history of depression/anxiety. * Referral screened out by Clinical Social Worker because none of the following criteria appear to apply: ~ History of anxiety/depression during this pregnancy, or of post-partum depression following prior delivery. ~ Diagnosis of anxiety and/or depression within last 3 years OR * MOB's symptoms currently being treated with medication and/or therapy. MOB has an active Rx for Wellbutrin and is an established patient with a psychiatrist.   Please contact the Clinical Social Worker if needs arise, by [facility] request, or if MOB scores greater than 9/yes to question 10 on [place] Postpartum Depression Screen.  Laurey Arrow, MSW, LCSW Clinical Social Work [phone]

## 2019-04-11 NOTE — Progress Notes (Signed)
Spoke with Dr. Posey Pronto, aware pt has a fish allergy which is contained in the PNV ordered. Dr. Posey Pronto states she will cancel PNV order and pt can take PNV from home.

## 2019-04-11 NOTE — Lactation Note (Signed)
This note was copied from a baby's chart. Lactation Consultation Note  Patient Name: Lori Weaver [id number] Date: 04/11/2019  P1, 2 hour female infant. Per Nurse, Mom want to receive [facility] services later today in morning she  would like to have some  rest at this time.    Maternal Data    Feeding Feeding Type: Breast Fed  LATCH Score Latch: Too sleepy or reluctant, no latch achieved, no sucking elicited.  Audible Swallowing: None  Type of Nipple: Everted at rest and after stimulation  Comfort (Breast/Nipple): Soft / non-tender  Hold (Positioning): No assistance needed to correctly position infant at breast.  LATCH Score: 6  Interventions    Lactation Tools Discussed/Used     Consult Status      [name] 04/11/2019, 4:07 AM

## 2019-04-12 ENCOUNTER — Inpatient Hospital Stay (HOSPITAL_COMMUNITY)
Admission: RE | Admit: 2019-04-12 | Discharge: 2019-04-12 | Disposition: A | Discharge status: Home / Self Care | Payer: Medicaid Other | Source: Ambulatory Visit

## 2019-04-12 MED ORDER — IBUPROFEN 600 MG PO TABS: 600.0000 mg | ORAL_TABLET | Freq: Four times a day (QID) | ORAL | 0 refills | Status: DC

## 2019-04-12 NOTE — Lactation Note (Signed)
This note was copied from a baby's chart. Lactation Consultation Note  Patient Name: Lori Weaver [id number] Date: 04/12/2019 Reason for consult: Follow-up assessment;Term;Infant weight loss  Baby is 92 1/2 hours old , 7% weight loss,  Per mom the plan is a repeat bili test.  Baby latched fully dressed when [place] entered the room, LC adjusted the  Pillows for better support and had mom bring the baby closer to the breast.  Increased swallows noted with fixing the position and the compressions.  Per mom more comfortable.  Baby fed for 10 -15 mins . Nipple slightly slanted when baby released.  LC reviewed breast feeding basics and importance of STS feedings until the baby is back to birth weight, gaining steadily and can stay awake for majority of feeding.  Discussed nutritive vs non - nutritive feeding patterns and the importance of watching the baby for hanging out latched. Mom denies sore nipples.  Sore nipple and engorgement prevention and tx reviewed.  Per mom has HAAKA and a DEBP at home.  Mom aware of the [facility] resources after D/C.    Maternal Data Has patient been taught Hand Expression?: Yes  Feeding Feeding Type: (latched - assisted with depth)  LATCH Score Latch: (latched shallow  asssisted with depth)  Audible Swallowing: (swallows increased with breast compressions)  Type of Nipple: (nipple slightly slanted after  feeding)  Comfort (Breast/Nipple): (per mom comforttable)        Interventions Interventions: Breast feeding basics reviewed;Adjust position  Lactation Tools Discussed/Used WIC Program: No Pump Review: Milk Storage   Consult Status Consult Status: Complete Date: 04/12/19    Myer Haff 04/12/2019, 10:34 AM

## 2019-04-12 NOTE — Lactation Note (Signed)
This note was copied from a baby's chart. Lactation Consultation Note  Patient Name: Lori Weaver [id number] Date: 04/12/2019  P1, 29 hour female infant. Infant had 2 stools and 2 voids since delivery. Per mom, infant started latching well around 11 pm tonight and  she breastfeed for 15 minutes. Mom knows to call Nurse or LC,  if she has any questions, concerns or needs assistance with latching infant to breast.  Mom knows to breastfeed infant according hunger cues, 8 to12 times within 24 hours and on demand.     Maternal Data    Feeding Feeding Type: Breast Fed  LATCH Score                   Interventions    Lactation Tools Discussed/Used     Consult Status      [name] 04/12/2019, 12:20 AM

## 2019-04-14 ENCOUNTER — Inpatient Hospital Stay (HOSPITAL_COMMUNITY): Admission: AD | Admit: 2019-04-14 | Payer: Medicaid Other | Source: Home / Self Care | Admitting: Family Medicine

## 2019-04-14 ENCOUNTER — Inpatient Hospital Stay (HOSPITAL_COMMUNITY): Payer: Medicaid Other

## 2019-04-25 ENCOUNTER — Other Ambulatory Visit: Payer: Self-pay | Admitting: Nurse Practitioner

## 2019-04-26 ENCOUNTER — Telehealth (HOSPITAL_COMMUNITY): Payer: Self-pay | Admitting: *Deleted

## 2019-04-26 ENCOUNTER — Telehealth: Payer: Self-pay | Admitting: Family Medicine

## 2019-04-26 DIAGNOSIS — N939 Abnormal uterine and vaginal bleeding, unspecified: Secondary | ICD-10-CM

## 2019-04-26 NOTE — Telephone Encounter (Signed)
Received a transferred call from the front desk.  The patient states she called the after hours line and was told she needed to schedule an appointment.  Patient states when she went to the restroom, she passed a clot slightly larger than the size of a quarter.  Denies heavy bleeding, passing additional clots or pain.  I explained she doesn't need an appointment right now.  Encouraged her to keep her postpartum appointment and to call should she pass more clots, have heavy bleeding or develop pain, etc.  Patient states understanding.  States she does need a refill on her antidepression medication.  I explained I would call the provider to get approval and would send to her pharmacy.  Verified pharmacy on file as for accuracy.  Spoke with Dr. Rosana Hoes, refill autorized.  Will send to pharmacy and route to Dr. Rosana Hoes for signature.

## 2019-04-26 NOTE — Telephone Encounter (Signed)
The patient stated she is passing a gold ball sized clot. 15 days postpartum. A message was sent to our office via mychart.   Responding back to inform the patient of the walk-in clinic on Monday however I also wanted to inform the nursing team in the event someone else should speak with her prior.  Thanks!

## 2019-04-29 NOTE — Telephone Encounter (Signed)
Called to speak with pt and reports she has spoken with a nurse previously. She did not have an further heavy bleeding. Pt reports she has no other symptoms at this time. Pt aware that if bleeding resumes and she is soaking 1 pad an hour she is to go to the Maternity Assessment Unit at [facility] for evaluation. Pt voiced understanding.

## 2019-05-12 ENCOUNTER — Other Ambulatory Visit: Payer: Self-pay

## 2019-05-12 ENCOUNTER — Encounter: Payer: Self-pay | Admitting: Obstetrics and Gynecology

## 2019-05-12 ENCOUNTER — Ambulatory Visit (INDEPENDENT_AMBULATORY_CARE_PROVIDER_SITE_OTHER): Payer: Medicare Other | Admitting: Obstetrics and Gynecology

## 2019-05-12 DIAGNOSIS — Z1389 Encounter for screening for other disorder: Secondary | ICD-10-CM

## 2019-05-12 NOTE — Progress Notes (Signed)
Subjective:     Lori Weaver is a 23 y.o. female who presents for a postpartum visit. She is 4 week postpartum following a svd. I have fully reviewed the prenatal and intrapartum course. The delivery was at 40.3 gestational weeks. Outcome: spontaneous vaginal delivery. Anesthesia: epidural. Postpartum course has been unremarkable. Baby's course has been unremarkable. Baby is feeding by breast. Bleeding staining only. Bowel function is normal. Bladder function is normal. Patient is not sexually active. Contraception method is none. Postpartum depression screening: negative.  The following portions of the patient's history were reviewed and updated as appropriate: allergies, current medications, past family history, past medical history, past social history, past surgical history and problem list.  Review of Systems Pertinent items are noted in HPI.   Objective:    BP 108/67   Pulse 78   Wt 131 lb (59.4 kg)   LMP 07/01/2018   BMI 27.38 kg/m   General:  alert and cooperative  Lungs: clear to auscultation bilaterally  Heart:  regular rate and rhythm, S1, S2 normal, no murmur, click, rub or gallop  Abdomen: soft, non-tender; bowel sounds normal; no masses,  no organomegaly   Vulva:  normal  Vagina: vagina positive for vaginal tear/laceration stitch noted at periurthra. Hurricane spray used, stitched removed without difficulty   Cervix:  Not tender, no CMT  Corpus: normal size, contour, position, consistency, mobility, non-tender  Adnexa:  no mass, fullness, tenderness  Rectal Exam: Not performed.        Assessment:   Normal postpartum exam. Pap smear not done.  Some pelvic pain, not associated with movement. Negative pelvic exam.   Plan:   1. Contraception: none 2. Wound check, periurthra incision clean, dry, intact. Healing well. Small stitch removed.  3. Follow up as needed, if symptoms worsen    Rasch, Artist Pais, NP 05/13/2019 10:46 AM

## 2019-05-24 ENCOUNTER — Telehealth: Payer: Self-pay

## 2019-05-24 ENCOUNTER — Other Ambulatory Visit: Payer: Self-pay | Admitting: Obstetrics and Gynecology

## 2019-05-24 NOTE — Telephone Encounter (Signed)
Pt returned call and I informed pt that the medication that she requested is at her [place] and that we can only refill for the month.  Pt verbalized understanding with no further questions.

## 2019-05-24 NOTE — Telephone Encounter (Addendum)
-----   Message from Sloan Leiter, MD sent at 05/24/2019  2:11 PM EST ----- Wellbutrin refilled, please call patient and make sure she is aware she will need to transition to PCP for ongoing management of this medication. Thanks.  LM for pt that we have refilled the medication that she requested for a one time basis and that she would need to contact her PCP for future refills.  If she has any questions or concerns to please give the office a call.

## 2019-06-09 ENCOUNTER — Other Ambulatory Visit: Payer: Self-pay

## 2019-06-09 ENCOUNTER — Ambulatory Visit (INDEPENDENT_AMBULATORY_CARE_PROVIDER_SITE_OTHER): Payer: BC Managed Care – PPO | Admitting: Obstetrics and Gynecology

## 2019-06-09 VITALS — BP 108/58 | HR 95 | Wt 132.0 lb

## 2019-06-09 DIAGNOSIS — R102 Pelvic and perineal pain: Secondary | ICD-10-CM

## 2019-06-09 NOTE — Progress Notes (Signed)
GYNECOLOGY ENCOUNTER NOTE  History:     Lori Weaver is a 23 y.o. G67P1001 female here with continued pain following a vaginal delivery on 04/11/19. States it hurts in both sides of her vagina and lower belly. At times feels like it hurts near the pelvic bones.  States she had intercourse at 6 weeks and did not have any pain. The pain worsens when she gets up from a sitting position or is moving around. She feels that the pain is coming from the bones between her legs.  No bleeding, no abdominal pain currently.   Obstetric History OB History  Gravida Para Term Preterm AB Living  1 1 1     1   SAB TAB Ectopic Multiple Live Births        0 1    # Outcome Date GA Lbr Len/2nd Weight Sex Delivery Anes PTL Lv  1 Term 04/11/19 [redacted]w[redacted]d 32:36 / 02:32 6 lb 14.6 oz (3.135 kg) F Vag-Spont EPI  LIV     Birth Comments: WNL    Past Medical History:  Diagnosis Date   Allergy    Anemia    Anxiety    Chlamydia    Depression    Dysmenorrhea    Gonorrhea     Past Surgical History:  Procedure Laterality Date   LAPAROSCOPY N/A 05/06/2017   Procedure: LAPAROSCOPY DIAGNOSTIC;  Surgeon: Osborne Oman, MD;  Location: [place];  Service: Gynecology;  Laterality: N/A;   LAPAROSCOPY ABDOMEN DIAGNOSTIC     TONSILLECTOMY AND ADENOIDECTOMY     TYMPANOSTOMY TUBE PLACEMENT Bilateral    WISDOM TOOTH EXTRACTION      Current Outpatient Medications on File Prior to Visit  Medication Sig Dispense Refill   buPROPion (WELLBUTRIN XL) 150 MG 24 hr tablet TAKE 1 TABLET(150 MG) BY MOUTH DAILY 30 tablet 0   Ferrous Fumarate (HEMOCYTE) 324 (106 Fe) MG TABS tablet Take 1 tablet (106 mg of iron total) by mouth 2 (two) times daily. (Patient not taking: Reported on 05/12/2019) 60 tablet 3   ibuprofen (ADVIL) 600 MG tablet Take 1 tablet (600 mg total) by mouth every 6 (six) hours. 30 tablet 0   Prenatal Vit-Fe Fumarate-FA (PRENATAL MULTIVITAMIN) TABS tablet Take 1 tablet by mouth daily at  12 noon.     No current facility-administered medications on file prior to visit.     Allergies  Allergen Reactions   Fish Allergy Anaphylaxis   Soap Rash    Use for Pre-op    Social History:  reports that she has never smoked. She has never used smokeless tobacco. She reports previous alcohol use. She reports previous drug use.  Family History  Problem Relation Age of Onset   Lupus Mother    Depression Mother    Anxiety disorder Mother    Migraines Mother    Lupus Maternal Grandmother     The following portions of the patient's history were reviewed and updated as appropriate: allergies, current medications, past family history, past medical history, past social history, past surgical history and problem list.  Review of Systems Pertinent items noted in HPI and remainder of comprehensive ROS otherwise negative.  Physical Exam:  BP (!) 108/58    Pulse 95    Wt 132 lb (59.9 kg)    LMP 07/01/2018    BMI 27.59 kg/m  CONSTITUTIONAL: Well-developed, well-nourished female in no acute distress.  EYES: Conjunctivae and EOM are normal. NECK: Normal range of motion, supple, no masses.  Normal thyroid.  SKIN: Skin is warm and dry. No rash noted. Not diaphoretic. No erythema. No pallor. MUSCULOSKELETAL: Normal range of motion.  NEUROLOGIC: Alert and oriented to person PSYCHIATRIC: Normal mood and affect.  ABDOMEN: Soft, no distention noted.  No tenderness, rebound or guarding.  PELVIC: Normal appearing external genitalia and urethral meatus; pain with deep palpation at pubic arch and pubic symphysis.   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  Continue heat/ice. And OTC ibuprofen F/u after Xray.     Jaxyn Mestas, Harolyn Rutherford, NP [facility] [employer], [facility]

## 2019-06-14 ENCOUNTER — Other Ambulatory Visit: Payer: Self-pay | Admitting: General Practice

## 2019-06-14 ENCOUNTER — Ambulatory Visit (HOSPITAL_COMMUNITY)
Admission: RE | Admit: 2019-06-14 | Discharge: 2019-06-14 | Disposition: A | Discharge status: Home / Self Care | Payer: BC Managed Care – PPO | Source: Ambulatory Visit | Attending: Obstetrics and Gynecology | Admitting: Obstetrics and Gynecology

## 2019-06-14 ENCOUNTER — Other Ambulatory Visit: Payer: Self-pay | Admitting: Obstetrics and Gynecology

## 2019-06-14 ENCOUNTER — Other Ambulatory Visit: Payer: Self-pay

## 2019-06-14 DIAGNOSIS — R102 Pelvic and perineal pain: Secondary | ICD-10-CM

## 2019-06-20 ENCOUNTER — Telehealth: Payer: Self-pay

## 2019-06-20 NOTE — Telephone Encounter (Signed)
Pt called requesting results of her xray.  LM for pt that her results are normal and if she has any questions to please give the office a call.   Mel Almond, RN 06/20/19

## 2019-06-22 ENCOUNTER — Telehealth: Payer: Self-pay | Admitting: Obstetrics and Gynecology

## 2019-06-22 NOTE — Telephone Encounter (Signed)
Spoke to Ms. Sainz regarding recent Xray results. She is scheduled to start PT on Friday 12/4. She agree's to call the office if needed.  Noni Saupe I, NP 06/22/2019 6:00 PM

## 2019-06-24 ENCOUNTER — Ambulatory Visit: Payer: BC Managed Care – PPO | Attending: Obstetrics and Gynecology | Admitting: Physical Therapy

## 2019-06-24 ENCOUNTER — Encounter: Payer: Self-pay | Admitting: Physical Therapy

## 2019-06-24 ENCOUNTER — Other Ambulatory Visit: Payer: Self-pay

## 2019-06-24 DIAGNOSIS — M6281 Muscle weakness (generalized): Secondary | ICD-10-CM | POA: Diagnosis present

## 2019-06-24 DIAGNOSIS — R293 Abnormal posture: Secondary | ICD-10-CM

## 2019-06-24 DIAGNOSIS — R252 Cramp and spasm: Secondary | ICD-10-CM

## 2019-06-24 NOTE — Patient Instructions (Signed)
Access Code: [email or web address]  URL: [email or web address]  Date: 06/24/2019  Prepared by: Jari Favre   Exercises  Supine Diaphragmatic Breathing - 10 reps - 1 sets - 3x daily - 7x weekly  Supine Single Knee to Chest Stretch - 5 reps - 1 sets - 10 sec hold - 2x daily - 7x weekly

## 2019-06-25 NOTE — Therapy (Signed)
[facility] [address], [address] [place], [place], [address] Phone: [phone]   Fax:  [phone]  Physical Therapy Evaluation  Patient Details  Name: Lori Weaver MRN: [id number] Date of Birth: 06/27/1996 Referring Provider (PT): Rasch, Harolyn RutherfordJennifer I, NP   Encounter Date: 06/24/2019  PT End of Session - 06/24/19 1020    Visit Number  1    Date for PT Re-Evaluation  09/16/19    Authorization Type  MCAID    PT Start Time  1005    PT Stop Time  1045    PT Time Calculation (min)  40 min       Past Medical History:  Diagnosis Date  . Allergy   . Anemia   . Anxiety   . Chlamydia   . Depression   . Dysmenorrhea   . Gonorrhea     Past Surgical History:  Procedure Laterality Date  . LAPAROSCOPY N/A 05/06/2017   Procedure: LAPAROSCOPY DIAGNOSTIC;  Surgeon: Tereso NewcomerAnyanwu, Ugonna A, MD;  Location: [facility];  Service: Gynecology;  Laterality: N/A;  . LAPAROSCOPY ABDOMEN DIAGNOSTIC    . TONSILLECTOMY AND ADENOIDECTOMY    . TYMPANOSTOMY TUBE PLACEMENT Bilateral   . WISDOM TOOTH EXTRACTION      There were no vitals filed for this visit.   Subjective Assessment - 06/24/19 1011    Subjective  Pt reports she has constant pain that gets up to 10/10 with certain movements and sometimes for no reason.  Pt has difficulty breast feeding due to pain.    Patient Stated Goals  get rid of pain    Currently in Pain?  Yes    Pain Score  3    gets up to 10/10   Pain Location  Pelvis    Pain Orientation  Mid;Anterior    Pain Descriptors / Indicators  Aching;Throbbing;Dull    Pain Type  Acute pain    Pain Onset  More than a month ago    Pain Frequency  Intermittent    Aggravating Factors   lying down for a long time, cold weather seems to aggravate, getting up after lying down    Pain Relieving Factors  Tylenol/ibuprphen dulls    Effect of Pain on Daily Activities  more pain with breast feeding         [facility] PT  Assessment - 06/25/19 0001      Assessment   Medical Diagnosis  R10.2 (ICD-10-CM) - Pelvic pain in female;Z39.2 (ICD-10-CM) - Postpartum care and examination    Referring Provider (PT)  Rasch, Harolyn RutherfordJennifer I, NP    Onset Date/Surgical Date  04/11/19    Prior Therapy  No      Precautions   Precautions  None      Restrictions   Weight Bearing Restrictions  No      Home Environment   Living Environment  Private residence    Living Arrangements  Children   daughter     Prior Function   Level of Independence  Independent    Vocation  Full time employment    Vocation Requirements  sorting and scanning packages, lifting      Cognition   Overall Cognitive Status  Within Functional Limits for tasks assessed      Posture/Postural Control   Posture/Postural Control  Postural limitations    Postural Limitations  Rounded Shoulders;Increased lumbar lordosis;Anterior pelvic tilt      AROM   Overall AROM Comments  lumbar flexion 75%  Strength   Overall Strength Comments  core 4/5; hip abduction bilat 4+/5      Flexibility   Soft Tissue Assessment /Muscle Length  yes    Hamstrings  80%      Ambulation/Gait   Gait Pattern  Within Functional Limits                Objective measurements completed on examination: See above findings.    Pelvic Floor Special Questions - 06/25/19 0001    Prior Pelvic/Prostate Exam  Yes    Are you Pregnant or attempting pregnancy?  No    Prior Pregnancies  Yes    Number of Pregnancies  1    Number of Vaginal Deliveries  1    Currently Sexually Active  Yes    Is this Painful  No    Urinary Leakage  No    Urinary urgency  No    Pelvic Floor Internal Exam  pt identity confirmed and informed consent given to perform internal soft tissue assessment    Exam Type  Vaginal    Palpation  ischiocavernosis TTP and pubococcygeus TTP; almost one finger diastasis rectus abdominus; pubic symphasis TTP    Strength  weak squeeze, no lift    Strength #  of seconds  3    Tone  high       [facility] Adult PT Treatment/Exercise - 06/25/19 0001      Self-Care   Self-Care  Other Self-Care Comments    Other Self-Care Comments   breathing and SI belt             PT Education - 06/24/19 1037    Education Details  Access Code: [email or web address] URL: [email or web address] Date: 06/24/2019 Prepared by: Jari Favre  Exercises Supine Diaphragmatic Breathing - 10 reps - 1 sets - 3x daily - 7x weekly Supine Single Knee to Chest Stretch - 5 reps - 1 sets - 10 sec hold - 2x daily - 7x weekly    Person(s) Educated  Patient    Methods  Explanation;Demonstration;Handout    Comprehension  Verbalized understanding;Returned demonstration          PT Long Term Goals - 06/25/19 0807      PT LONG TERM GOAL #1   Title  Pt will report 4/10 pain at the most    Baseline  10/10 at worst    Time  12    Period  Weeks    Status  New    Target Date  09/16/19      PT LONG TERM GOAL #2   Title  Pt will report no pain with supine<>sit or sit<>stand transitions    Time  12    Period  Weeks    Status  New    Target Date  09/16/19      PT LONG TERM GOAL #3   Title  Pt will be ind with advanced HEP with core strengthening exercises done with functional movement.    Time  12    Period  Weeks    Status  New    Target Date  09/16/19      PT LONG TERM GOAL #4   Title  Pt will be able to breast feed without increased pain    Time  12    Period  Weeks    Status  New    Target Date  09/16/19             Plan - 06/25/19 5093  Clinical Impression Statement  Pt presents to clinic today due to pain in pubic symphasis since the delivery of her baby . Pt has weakness in abdominal muscles and pain with palpation to rectus abdominus attachments.  She has very tight and TTP pelvic floor specifically ischiocavernosis and pubococcygeus muscles.  Pt also has tight lumbar erectors and anterior pelvic tilt.  She will benefit from addressing these  and above mentioned impairments in order to return to full function without continued pain.    Personal Factors and Comorbidities  Age    Examination-Activity Limitations  Caring for Others;Sleep    Stability/Clinical Decision Making  Stable/Uncomplicated    Clinical Decision Making  Low    Rehab Potential  Excellent    PT Frequency  1x / week    PT Duration  12 weeks    PT Treatment/Interventions  ADLs/Self Care Home Management;Biofeedback;Cryotherapy;Electrical Stimulation;Moist Heat;Therapeutic activities;Therapeutic exercise;Neuromuscular re-education;Patient/family education;Manual techniques;Dry needling;Taping    PT Next Visit Plan  lumbar and abdominal fascial release, f/u on SI belt, internal STM release and breathing and bulging pelvic floor    PT Home Exercise Plan  start stretches next    Consulted and Agree with Plan of Care  Patient       Patient will benefit from skilled therapeutic intervention in order to improve the following deficits and impairments:  Pain, Increased fascial restricitons, Postural dysfunction, Increased muscle spasms, Decreased range of motion, Decreased strength  Visit Diagnosis: Muscle weakness (generalized) - Plan: PT plan of care cert/re-cert  Abnormal posture - Plan: PT plan of care cert/re-cert  Cramp and spasm - Plan: PT plan of care cert/re-cert     Problem List Patient Active Problem List   Diagnosis Date Noted  . Postpartum care and examination 05/13/2019  . Post-dates pregnancy 04/09/2019  . Anemia in pregnancy 01/26/2019  . Supervision of other normal pregnancy, antepartum 09/15/2018  . Anxiety 09/15/2018  . Irritant contact dermatitis 01/19/2018  . Family history of lupus erythematosus 12/02/2017  . Depression 12/02/2017  . Acne vulgaris 12/02/2017    Junious Silk, PT 06/25/2019, 8:32 AM  [facility] [address], [address] [place], [place], [address] Phone:  [phone]   Fax:  [phone]  Name: Lori Weaver MRN: [id number] Date of Birth: 1996-02-12

## 2019-06-29 ENCOUNTER — Other Ambulatory Visit: Payer: Self-pay

## 2019-06-29 ENCOUNTER — Ambulatory Visit (INDEPENDENT_AMBULATORY_CARE_PROVIDER_SITE_OTHER): Payer: BC Managed Care – PPO | Admitting: Family Medicine

## 2019-06-29 ENCOUNTER — Other Ambulatory Visit (HOSPITAL_COMMUNITY)
Admission: RE | Admit: 2019-06-29 | Discharge: 2019-06-29 | Disposition: A | Discharge status: Home / Self Care | Payer: BC Managed Care – PPO | Source: Ambulatory Visit | Attending: Family Medicine | Admitting: Family Medicine

## 2019-06-29 ENCOUNTER — Encounter: Payer: Self-pay | Admitting: Family Medicine

## 2019-06-29 VITALS — BP 90/60 | HR 61 | Wt 124.8 lb

## 2019-06-29 DIAGNOSIS — Z113 Encounter for screening for infections with a predominantly sexual mode of transmission: Secondary | ICD-10-CM | POA: Insufficient documentation

## 2019-06-29 DIAGNOSIS — F331 Major depressive disorder, recurrent, moderate: Secondary | ICD-10-CM

## 2019-06-29 LAB — POCT WET PREP (WET MOUNT)
Clue Cells Wet Prep Whiff POC: NEGATIVE
Trichomonas Wet Prep HPF POC: ABSENT

## 2019-06-29 MED ORDER — BUPROPION HCL ER (XL) 300 MG PO TB24: 300.0000 mg | ORAL_TABLET | Freq: Every day | ORAL | 3 refills | Status: DC

## 2019-06-29 MED ORDER — IBUPROFEN 600 MG PO TABS: 600.0000 mg | ORAL_TABLET | Freq: Four times a day (QID) | ORAL | 0 refills | Status: DC

## 2019-06-29 NOTE — Patient Instructions (Signed)
It was nice seeing you today [name]!  I have sent in bupropion 300 mg once daily.  If this causes any side effects, please let me know, and we will change it to a tablet and a half of your old dose.  Please know that you can contact me by calling our clinic and through [facility] if you would like to talk.  I would like to see you back in about 1 month to see how you are doing on this new dose.  I have sent in more ibuprofen for your pubic symphysis pain.  I hope that this improves soon.  We have contacted STI testing today.  We will let you know if any of these tests are abnormal.  If you have any questions or concerns, please feel free to call the clinic.   Be well,  Dr. Shan Levans

## 2019-06-29 NOTE — Assessment & Plan Note (Signed)
Wet prep, gonorrhea, chlamydia, HIV, and RPR testing performed today.

## 2019-06-29 NOTE — Assessment & Plan Note (Signed)
Ibuprofen refilled.  Patient reassured that this will heal with time.  Encouraged patient to continue going to physical therapy.

## 2019-06-29 NOTE — Assessment & Plan Note (Signed)
We will increase patient's bupropion from 150 mg to 300 mg once daily at her request.  If she develops a headache to the increased dose like she did last time, we can always go back to 150 and do one half tabs daily.  Also encouraged her to reach out to me if she would like to talk further about her mood.  I would like to see her back in about 1 month to reassess.

## 2019-06-29 NOTE — Progress Notes (Signed)
Subjective:    Lori Weaver - 23 y.o. female MRN [id number]  Date of birth: [birth year]  CC:  Lori Weaver is here for mood, STI testing, and pubic symphysis pain.  HPI: Anxiety and depression Patient reports that she has been struggling with her depression since the birth of her daughter.  She is also having difficulties with her daughter's father which has been very stressful.  She has 1 source of support in the area, which is her godmother.  She does see a therapist, although she has only been able to speak to her over the phone due to Covid.  She does have a stepmother who lives in [place] and will come and visit her every now and then.  She says that she feels guilty that she does not enjoy being a mother.  She denies wanting to harm herself or her daughter.  She is struggling with getting enough sleep due to breast-feeding and pumping regularly.  She has taken bupropion 150 mg daily for over 3 years and she does think that this medication works well for her, but she thinks she needs an increased dose.  She did try 300 mg daily in the past and got a headache in response, so she is nervous at an increased dose may cause this symptom again.  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  Would like a refill of her ibuprofen.  Has tried Tylenol but has not seen any relief from this, but ibuprofen does seem to be helpful.  Is seeing physical therapy for this next week.  Health Maintenance:  Health Maintenance Due  Topic Date Due  . TETANUS/TDAP  08/22/2016  . INFLUENZA VACCINE  02/19/2019    -  reports that she has never smoked. She has never used smokeless tobacco. - Review of Systems: Per HPI. - Past Medical History: Patient Active Problem List   Diagnosis Date Noted  . 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 Postpartum care and examination 05/13/2019  . Post-dates pregnancy 04/09/2019  . Anemia in pregnancy 01/26/2019  . Supervision of other normal pregnancy, antepartum 09/15/2018  . Anxiety 09/15/2018  . Irritant contact dermatitis 01/19/2018  . Screen for STD (sexually transmitted disease) 01/19/2018  . Family history of lupus erythematosus 12/02/2017  . Depression 12/02/2017  . Acne vulgaris 12/02/2017   - Medications: reviewed and updated   Objective:   Physical Exam BP 90/60   Pulse 61   Wt 124 lb 12.8 oz (56.6 kg)   LMP 07/01/2018   SpO2 99%   BMI 26.08 kg/m  Gen: NAD, alert, cooperative with exam GU: Normal vulva, vagina, and cervix without lesions or abnormal discharge, no cervical motion tenderness Psych: good insight, alert and oriented, depressed mood and affect, tearful throughout exam  Depression screen [facility] 2/9 06/29/2019 06/29/2019 04/04/2019  Decreased Interest 1 0 3  Down, Depressed, Hopeless 3 0 1  PHQ - 2 Score 4 0 4  Altered sleeping 1 - 2  Tired, decreased energy 2 - 3  Change in appetite 1 - 0  Feeling bad or failure about yourself  2 - 0  Trouble concentrating 0 - 0  Moving slowly or fidgety/restless 0 - 0  Suicidal thoughts 0 - 0  PHQ-9 Score 10 - 9  Difficult doing work/chores  Very difficult - -   GAD 7 : Generalized Anxiety Score 06/29/2019 04/04/2019 01/26/2019 10/13/2018  Nervous, Anxious, on Edge 2 - 1 1  Control/stop worrying 1 1 0 0  Worry too much - different things 1 1 0 0  Trouble relaxing 1 2 0 0  Restless 0 1 0 0  Easily annoyed or irritable 3 3 1 1   Afraid - awful might happen 1 1 0 0  Total GAD 7 Score 9 - 2 2  Anxiety Difficulty Very difficult - - -         Assessment & Plan:   Screen for STD (sexually transmitted disease) Wet prep, gonorrhea, chlamydia, HIV, and RPR testing performed today.  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  Patient reassured that  this will heal with time.  Encouraged patient to continue going to physical therapy.  Depression We will increase patient's bupropion from 150 mg to 300 mg once daily at her request.  If she develops a headache to the increased dose like she did last time, we can always go back to 150 and do one half tabs daily.  Also encouraged her to reach out to me if she would like to talk further about her mood.  I would like to see her back in about 1 month to reassess.    , M.D. 06/29/2019, 5:09 PM PGY-3, [facility]

## 2019-06-30 LAB — HIV ANTIBODY (ROUTINE TESTING W REFLEX): HIV Screen 4th Generation wRfx: NONREACTIVE

## 2019-06-30 LAB — HEPATITIS C ANTIBODY (REFLEX): HCV Ab: <0.1 s/co ratio (ref 0.0–0.9)

## 2019-06-30 LAB — RPR: RPR Ser Ql: NONREACTIVE

## 2019-06-30 LAB — HCV COMMENT:

## 2019-07-01 LAB — CERVICOVAGINAL ANCILLARY ONLY
Chlamydia: NEGATIVE
Comment: NEGATIVE
Comment: NORMAL
Neisseria Gonorrhea: NEGATIVE

## 2019-07-05 ENCOUNTER — Encounter: Payer: Self-pay | Admitting: General Practice

## 2019-07-08 ENCOUNTER — Encounter: Payer: Self-pay | Admitting: Family Medicine

## 2019-07-08 ENCOUNTER — Ambulatory Visit: Payer: BC Managed Care – PPO | Admitting: Physical Therapy

## 2019-07-08 ENCOUNTER — Telehealth: Payer: Self-pay | Admitting: Physical Therapy

## 2019-07-08 NOTE — Telephone Encounter (Signed)
Patient called due to no show, but PT was not able to leave a message on VM.  Got a message saying, "nothing has been recorded" after two attempts.  Gustavus Bryant, PT 07/08/19 8:57 AM

## 2019-07-14 ENCOUNTER — Ambulatory Visit: Payer: BC Managed Care – PPO | Admitting: Physical Therapy

## 2019-07-14 ENCOUNTER — Other Ambulatory Visit: Payer: Self-pay

## 2019-07-14 DIAGNOSIS — R293 Abnormal posture: Secondary | ICD-10-CM

## 2019-07-14 DIAGNOSIS — R252 Cramp and spasm: Secondary | ICD-10-CM

## 2019-07-14 DIAGNOSIS — M6281 Muscle weakness (generalized): Secondary | ICD-10-CM | POA: Diagnosis not present

## 2019-07-14 NOTE — Patient Instructions (Signed)
Access Code: [email or web address]  URL: [email or web address]  Date: 07/14/2019  Prepared by: Jari Favre   Exercises Supine Diaphragmatic Breathing - 10 reps - 1 sets - 3x daily - 7x weekly Supine Single Knee to Chest Stretch - 5 reps - 1 sets - 10 sec hold                            - 2x daily - 7x weekly Supine Transversus Abdominis Bracing - Hands on Stomach - 10 reps - 3 sets - 1x daily - 7x weekly Hooklying Small March - 10 reps - 2 sets - 1x daily - 7x weekly

## 2019-07-14 NOTE — Therapy (Addendum)
[facility] [address] [place], [place], [address] Phone: [phone]   Fax:  [phone]  Physical Therapy Treatment  Patient Details  Name: Lori Weaver MRN: [id number] Date of Birth: 02-12-1996 Referring Provider (PT): Rasch, Artist Pais, NP   Encounter Date: 07/14/2019  PT End of Session - 07/14/19 1250    Visit Number  2    Date for PT Re-Evaluation  09/16/19    Authorization Type  MCAID    PT Start Time  1245   pt arrived 15 minutes late   PT Stop Time  1313    PT Time Calculation (min)  28 min    Activity Tolerance  Patient tolerated treatment well    Behavior During Therapy  [facility] for tasks assessed/performed       Past Medical History:  Diagnosis Date  . Allergy   . Anemia   . Anxiety   . Chlamydia   . Depression   . Dysmenorrhea   . Gonorrhea     Past Surgical History:  Procedure Laterality Date  . LAPAROSCOPY N/A 05/06/2017   Procedure: LAPAROSCOPY DIAGNOSTIC;  Surgeon: Osborne Oman, MD;  Location: [place];  Service: Gynecology;  Laterality: N/A;  . LAPAROSCOPY ABDOMEN DIAGNOSTIC    . TONSILLECTOMY AND ADENOIDECTOMY    . TYMPANOSTOMY TUBE PLACEMENT Bilateral   . WISDOM TOOTH EXTRACTION      There were no vitals filed for this visit.  Subjective Assessment - 07/14/19 1245    Subjective  I have been working every day recently and have been off recently and instead of pumping have been breast feeding.  Now the pain is back.    Currently in Pain?  Yes    Pain Score  9     Pain Location  Pelvis    Pain Orientation  Mid;Anterior    Pain Descriptors / Indicators  Aching    Pain Type  Chronic pain    Pain Onset  More than a month ago    Pain Frequency  Intermittent    Aggravating Factors   breast feeding    Multiple Pain Sites  No                       [facility] Adult PT Treatment/Exercise - 07/14/19 0001      Neuro Re-ed    Neuro Re-ed Details   TrA  activation using "s" sound and breathing technique, tactile and verbal cues      Lumbar Exercises: Stretches   Prone on Elbows Stretch  5 reps;10 seconds      Lumbar Exercises: Supine   Bent Knee Raise  10 reps;2 seconds      Lumbar Exercises: Quadruped   Other Quadruped Lumbar Exercises  modified on table - arm reaches with TrA activated      Manual Therapy   Manual Therapy  Soft tissue mobilization    Soft tissue mobilization  lumbar stretch, compression of ilium in sidelying; supine rectus abdominus STM             PT Education - 07/14/19 1308    Education Details  Access Code: [email or web address]    Person(s) Educated  Patient    Methods  Explanation;Demonstration;Tactile cues;Verbal cues;Handout    Comprehension  Verbalized understanding;Returned demonstration          PT Long Term Goals - 07/14/19 1320      PT LONG TERM GOAL #1   Title  Pt will report 4/10 pain at the most    Baseline  reduced from 9/10 to 4 or 5 after treatment today    Status  Achieved      PT LONG TERM GOAL #2   Title  Pt will report no pain with supine<>sit or sit<>stand transitions    Baseline  was in pain during treatment but did not increase with those movements    Status  On-going      PT LONG TERM GOAL #3   Title  Pt will be ind with advanced HEP with core strengthening exercises done with functional movement.    Status  On-going      PT LONG TERM GOAL #4   Title  Pt will be able to breast feed without increased pain    Baseline  this activity is causing pain    Status  On-going            Plan - 07/14/19 1315    Clinical Impression Statement  Pt responded well to treatment today and pain reduced from 9/10 down to 4-5/10.  She had less pain after STM to rectus abdominus and activation of TrA.  Pain decreases with anterior compression to ilium demonstrating that patient will benefit from stabilization of the pubic symphasis.  She was educated on SI loc belt or getting something  similar in order to provide for stability during activities that aggravate her pain such as breast feeding.  Pt will benefit from skilled PT in order to work on improved core strength for return to all functional activities without pain.    PT Treatment/Interventions  ADLs/Self Care Home Management;Biofeedback;Cryotherapy;Electrical Stimulation;Moist Heat;Therapeutic activities;Therapeutic exercise;Neuromuscular re-education;Patient/family education;Manual techniques;Dry needling;Taping    PT Next Visit Plan  f/u on SI belf, progress core strength as tolerated, lumbar and pelvic stretches    Consulted and Agree with Plan of Care  Patient       Patient will benefit from skilled therapeutic intervention in order to improve the following deficits and impairments:  Pain, Increased fascial restricitons, Postural dysfunction, Increased muscle spasms, Decreased range of motion, Decreased strength  Visit Diagnosis: Muscle weakness (generalized)  Abnormal posture  Cramp and spasm     Problem List Patient Active Problem List   Diagnosis Date Noted  . 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 Postpartum care and examination 05/13/2019  . Post-dates pregnancy 04/09/2019  . Anemia in pregnancy 01/26/2019  . Supervision of other normal pregnancy, antepartum 09/15/2018  . Anxiety 09/15/2018  . Irritant contact dermatitis 01/19/2018  . Screen for STD (sexually transmitted disease) 01/19/2018  . Family history of lupus erythematosus 12/02/2017  . Depression 12/02/2017  . Acne vulgaris 12/02/2017    Jule Ser, PT 07/14/2019, 1:26 PM  [facility] [address], [place], [place], [address] Phone: [phone]   Fax:  [phone]  Name: Lori Weaver MRN: [id number] Date of Birth: 1995/08/01  PHYSICAL THERAPY DISCHARGE SUMMARY  Visits from Start of Care: 2  Current functional level related to  goals / functional outcomes: See above goals   Remaining deficits: See above   Education / Equipment: HEP  Plan: Patient agrees to discharge.  Patient goals were not met. Patient is being discharged due to not returning since the last visit.  ?????    [employer], PT 09/19/19 11:15 AM

## 2019-07-21 ENCOUNTER — Other Ambulatory Visit: Payer: Self-pay | Admitting: Obstetrics and Gynecology

## 2019-07-21 ENCOUNTER — Ambulatory Visit: Payer: BC Managed Care – PPO | Admitting: Physical Therapy

## 2019-07-29 ENCOUNTER — Telehealth: Payer: Self-pay | Admitting: Physical Therapy

## 2019-07-29 ENCOUNTER — Ambulatory Visit: Payer: Medicaid Other | Attending: Obstetrics and Gynecology | Admitting: Physical Therapy

## 2019-07-29 NOTE — Telephone Encounter (Signed)
Patient did not show for appointment.  Patient was called and PT left message to please call us back.  Russella Dar, PT 07/29/19 9:58 AM

## 2019-08-05 ENCOUNTER — Encounter (HOSPITAL_COMMUNITY): Payer: Self-pay | Admitting: Obstetrics and Gynecology

## 2019-08-10 ENCOUNTER — Ambulatory Visit: Payer: Medicaid Other | Admitting: Family Medicine

## 2019-08-15 ENCOUNTER — Encounter: Payer: Self-pay | Admitting: Family Medicine

## 2019-08-15 ENCOUNTER — Ambulatory Visit (INDEPENDENT_AMBULATORY_CARE_PROVIDER_SITE_OTHER): Payer: BC Managed Care – PPO | Admitting: Family Medicine

## 2019-08-15 ENCOUNTER — Other Ambulatory Visit: Payer: Self-pay

## 2019-08-15 VITALS — BP 92/64 | HR 94 | Wt 114.4 lb

## 2019-08-15 DIAGNOSIS — N939 Abnormal uterine and vaginal bleeding, unspecified: Secondary | ICD-10-CM

## 2019-08-15 DIAGNOSIS — F331 Major depressive disorder, recurrent, moderate: Secondary | ICD-10-CM | POA: Diagnosis not present

## 2019-08-15 NOTE — Patient Instructions (Signed)
It was nice seeing you today Ms. Dozal!  I am glad that the Wellbutrin is helpful.  We will keep the dose the same today and follow-up in 1 to 2 months.  If you would like to add either hydroxyzine or buspirone for additional help with anxiety, let me know and I will look into these options.  We will check your blood count today to make sure that you are not anemic due to your longer periods lately.  If this continues to be a problem, let me know.  I will be in touch regarding your CBC if it is abnormal.  I will release the results through MyChart as well.  If you have any questions or concerns, please feel free to call the clinic.   Be well,  Dr. Frances Furbish

## 2019-08-15 NOTE — Progress Notes (Signed)
Subjective:    Lori Weaver - 24 y.o. female MRN [id number]  Date of birth: 1996/05/27  CC:  Lori Weaver is here for follow up of depression.  She would also like to discuss abnormal uterine bleeding.  HPI: Depression Patient says that she is doing well with the increase in her Wellbutrin dose from 150 mg daily to 300 mg daily.  She says that she is still quite tired but motherhood feels a bit easier than it did at her last visit.  She has not experienced any headaches or other side effects after this dose increase.  She continues to breast-feed without difficulty.  She does have some continued anxiety.  Abnormal uterine bleeding Patient reports that her periods have been irregular recently.  She skipped her menstrual period in December and has had 2 in January.  Her current menstrual period has been going on for a week, which is longer than normal for her.  She goes through 5 pads a day, which is a normal amount for her.  She has not had any bothersome cramping.  She does continue to breast-feed or pump daily.  She did feel fatigued yesterday and wonders if she could be anemic.  Health Maintenance:  Health Maintenance Due  Topic Date Due  . TETANUS/TDAP  08/22/2016  . INFLUENZA VACCINE  02/19/2019    -  reports that she has never smoked. She has never used smokeless tobacco. - Review of Systems: Per HPI. - Past Medical History: Patient Active Problem List   Diagnosis Date Noted  . Abnormal uterine bleeding 08/16/2019  . 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 Postpartum care and examination 05/13/2019  . Post-dates pregnancy 04/09/2019  . Anemia in pregnancy 01/26/2019  . Supervision of other normal pregnancy, antepartum 09/15/2018  . Anxiety 09/15/2018  . Irritant contact dermatitis 01/19/2018  . Screen for STD (sexually transmitted disease) 01/19/2018  . Family history of lupus erythematosus 12/02/2017  . Depression 12/02/2017  . Acne vulgaris  12/02/2017   - Medications: reviewed and updated   Objective:   Physical Exam BP 92/64   Pulse 94   Wt 114 lb 6.4 oz (51.9 kg)   LMP 08/09/2019 (Exact Date)   SpO2 98%   BMI 23.91 kg/m  Gen: NAD, alert, cooperative with exam, appears tired but is pleasant CV: RRR, good S1/S2, no murmur Resp: CTABL, no wheezes, non-labored Abd: SNTND, BS present, no guarding or organomegaly Psych: good insight, alert and oriented, appropriate mood and affect  GAD 7 : Generalized Anxiety Score 08/15/2019 08/15/2019 06/29/2019 04/04/2019  Nervous, Anxious, on Edge 1 1 2  -  Control/stop worrying 1 1 1 1   Worry too much - different things 2 2 1 1   Trouble relaxing 1 1 1 2   Restless 0 0 0 1  Easily annoyed or irritable 2 2 3 3   Afraid - awful might happen 0 0 1 1  Total GAD 7 Score 7 7 9  -  Anxiety Difficulty Very difficult Very difficult Very difficult -   Depression screen [facility] 2/9 08/16/2019 06/29/2019 06/29/2019  Decreased Interest 1 1 0  Down, Depressed, Hopeless 1 3 0  PHQ - 2 Score 2 4 0  Altered sleeping 1 1 -  Tired, decreased energy 2 2 -  Change in appetite - 1 -  Feeling bad or failure about yourself  - 2 -  Trouble concentrating 1 0 -  Moving slowly or fidgety/restless 0 0 -  Suicidal thoughts  0 0 -  PHQ-9 Score 6 10 -  Difficult doing work/chores - Very difficult -       Assessment & Plan:   Depression Seems to be improving after increasing her Wellbutrin.  Offered addition of buspirone or hydroxyzine which patient has used in the past.  Patient will contact me if she is interested in adding these medications and I will ensure that they are safe with breast-feeding.  She will plan to follow-up with me in 1 to 2 months.  Abnormal uterine bleeding Likely a result of postpartum and breast-feeding, but due to patient's fatigue and anemia during pregnancy, will obtain a CBC today.    Lezlie Octave, M.D. 08/16/2019, 7:54 AM PGY-3, [facility]

## 2019-08-16 DIAGNOSIS — N939 Abnormal uterine and vaginal bleeding, unspecified: Secondary | ICD-10-CM | POA: Insufficient documentation

## 2019-08-16 LAB — CBC
Hematocrit: 36.2 % (ref 34.0–46.6)
Hemoglobin: 12.1 g/dL (ref 11.1–15.9)
MCH: 29.1 pg (ref 26.6–33.0)
MCHC: 33.4 g/dL (ref 31.5–35.7)
MCV: 87 fL (ref 79–97)
Platelets: 363 10*3/uL (ref 150–450)
RBC: 4.16 x10E6/uL (ref 3.77–5.28)
RDW: 12.1 % (ref 11.7–15.4)
WBC: 7 10*3/uL (ref 3.4–10.8)

## 2019-08-16 NOTE — Assessment & Plan Note (Signed)
Seems to be improving after increasing her Wellbutrin.  Offered addition of buspirone or hydroxyzine which patient has used in the past.  Patient will contact me if she is interested in adding these medications and I will ensure that they are safe with breast-feeding.  She will plan to follow-up with me in 1 to 2 months.

## 2019-08-16 NOTE — Assessment & Plan Note (Signed)
Likely a result of postpartum and breast-feeding, but due to patient's fatigue and anemia during pregnancy, will obtain a CBC today.

## 2019-09-15 ENCOUNTER — Other Ambulatory Visit (HOSPITAL_COMMUNITY)
Admission: RE | Admit: 2019-09-15 | Discharge: 2019-09-15 | Disposition: A | Discharge status: Home / Self Care | Payer: BC Managed Care – PPO | Source: Ambulatory Visit | Attending: Family Medicine | Admitting: Family Medicine

## 2019-09-15 ENCOUNTER — Other Ambulatory Visit: Payer: Self-pay

## 2019-09-15 ENCOUNTER — Ambulatory Visit (INDEPENDENT_AMBULATORY_CARE_PROVIDER_SITE_OTHER): Payer: BC Managed Care – PPO | Admitting: Family Medicine

## 2019-09-15 ENCOUNTER — Encounter: Payer: Self-pay | Admitting: Family Medicine

## 2019-09-15 VITALS — BP 94/54 | HR 92 | Ht <= 58 in | Wt 109.8 lb

## 2019-09-15 DIAGNOSIS — F331 Major depressive disorder, recurrent, moderate: Secondary | ICD-10-CM | POA: Diagnosis not present

## 2019-09-15 DIAGNOSIS — N941 Unspecified dyspareunia: Secondary | ICD-10-CM | POA: Diagnosis not present

## 2019-09-15 DIAGNOSIS — Z113 Encounter for screening for infections with a predominantly sexual mode of transmission: Secondary | ICD-10-CM | POA: Insufficient documentation

## 2019-09-15 DIAGNOSIS — R634 Abnormal weight loss: Secondary | ICD-10-CM | POA: Diagnosis not present

## 2019-09-15 NOTE — Patient Instructions (Addendum)
It was nice seeing you today Ms. Pullara!  Today, we took some tests to work-up your weight loss and recent pain.  I will let you know what those results are through MyChart once they are available and will call you if there are any abnormal results.  Please work on getting 3 meals with protein, carbohydrate, and fruits or vegetables per day.  We will continue your Wellbutrin at the current dose.  Let's plan on following up in about 3 months.  If you have any questions or concerns, please feel free to call the clinic.   Be well,  Dr. Frances Furbish

## 2019-09-15 NOTE — Progress Notes (Signed)
SUBJECTIVE:   CHIEF COMPLAINT / HPI:   Unintentional weight loss Noted that Wellbutrin caused her to lose weight when it was at the increased dose of 300 mg in the past Yesterday, she ate one meal, which was pasta with crab meat Will have one meal with several snacks in a day - snacks include granola bars, chips, fruit Has been trying to drink protein shakes to gain weight Says that appetite is good Has noticed that hair started falling out around 3 months after giving birth No skin changes Is feeling sweaty when breast feeding daughter, is breast feeding or pumping very frequently Some heat intolerance, denies palpitations  Dyspareunia Feels pain at the onset of penetration but also during penetration, which she describes as deep in her pelvis No abnormal vaginal discharge Is appropriately stimulated during sex activity and has no difficulty with desire No postcoital bleeding Had no dyspareunia at 6 weeks postpartum, but the pain she is experiencing now has developed gradually since then Reports that she was told that she had a cervical laceration at birth and wonders if this is the reason for her dyspareunia  Depression/anxiety Feels much better with Wellbutrin 300 mg daily Would like to continue this medication Denies SI or other modes of self-harm Has recently quit her job, which she says has been very helpful for improving her mood Feels like motherhood has become somewhat easier  PERTINENT  PMH / PSH: depression, anxiety, recent pregnancy  OBJECTIVE:   BP (!) 94/54   Pulse 92   Ht 4\' 10"  (1.473 m)   Wt 109 lb 12.8 oz (49.8 kg)   SpO2 97%   BMI 22.95 kg/m   General: well appearing, appears stated age Cardiac: RRR, no MRG Respiratory: CTAB, no rhonchi, rales, or wheezing, normal work of breathing GU: normal appearing vulva and vagina, cervix with ectropion but without lacerations or other lesions, no cervical motion tenderness, no abnormal vaginal discharge  Psych: appropriate mood and affect  Depression screen [facility] 2/9 09/16/2019 09/15/2019 08/16/2019  Decreased Interest 0 0 1  Down, Depressed, Hopeless 1 1 1   PHQ - 2 Score 1 1 2   Altered sleeping 0 - 1  Tired, decreased energy 0 - 2  Change in appetite 1 - -  Feeling bad or failure about yourself  0 - -  Trouble concentrating 0 - 1  Moving slowly or fidgety/restless 0 - 0  Suicidal thoughts 0 - 0  PHQ-9 Score 2 - 6  Difficult doing work/chores Somewhat difficult - -   GAD 7 : Generalized Anxiety Score 09/16/2019 08/15/2019 08/15/2019 06/29/2019  Nervous, Anxious, on Edge 1 1 1 2   Control/stop worrying 0 1 1 1   Worry too much - different things 0 2 2 1   Trouble relaxing 0 1 1 1   Restless 0 0 0 0  Easily annoyed or irritable 1 2 2 3   Afraid - awful might happen 0 0 0 1  Total GAD 7 Score 2 7 7 9   Anxiety Difficulty Somewhat difficult Very difficult Very difficult Very difficult     ASSESSMENT/PLAN:   Depression With concomitant anxiety, both improving on Wellbutrin 300 mg daily.  We will continue this medication since patient has improved on it.  She will follow up with me in about 3 months.  Weight loss, unintentional Patient's weight is 109 pounds today, down from 114 pounds at her last visit and down from her highest weight of around 130 pounds.  Patient's BMI is 23, which is  appropriate.  Her weight loss could be due to breast-feeding as well as a lower appetite.  It is possible that Wellbutrin is contributing as well.  Counseled patient on working towards eating 3 real meals per day, which is defined by a meal continue protein and carbohydrates.  She will hopefully be able to reduce unhealthy snacking with this plan as well.  We will also obtain a TSH since patient to rule out hyperthyroidism as a cause.  Dyspareunia in female Since patient cervical exam was notable for ectropion, it is possible that her cervix is more sensitive.  Recommended that patient use lubrication and  experiment with her partner to find ways to make sex more comfortable for her.  Tested her for STIs and vaginal infections today including GC/chlamydia, HIV, RPR, yeast, bacterial vaginosis.     Kathrene Alu, MD [facility]

## 2019-09-16 DIAGNOSIS — R634 Abnormal weight loss: Secondary | ICD-10-CM | POA: Insufficient documentation

## 2019-09-16 LAB — TSH: TSH: 1.36 u[IU]/mL (ref 0.450–4.500)

## 2019-09-16 LAB — HIV ANTIBODY (ROUTINE TESTING W REFLEX): HIV Screen 4th Generation wRfx: NONREACTIVE

## 2019-09-16 LAB — RPR: RPR Ser Ql: NONREACTIVE

## 2019-09-16 NOTE — Assessment & Plan Note (Signed)
Since patient cervical exam was notable for ectropion, it is possible that her cervix is more sensitive.  Recommended that patient use lubrication and experiment with her partner to find ways to make sex more comfortable for her.  Tested her for STIs and vaginal infections today including GC/chlamydia, HIV, RPR, yeast, bacterial vaginosis.

## 2019-09-16 NOTE — Assessment & Plan Note (Signed)
Patient's weight is 109 pounds today, down from 114 pounds at her last visit and down from her highest weight of around 130 pounds.  Patient's BMI is 23, which is appropriate.  Her weight loss could be due to breast-feeding as well as a lower appetite.  It is possible that Wellbutrin is contributing as well.  Counseled patient on working towards eating 3 real meals per day, which is defined by a meal continue protein and carbohydrates.  She will hopefully be able to reduce unhealthy snacking with this plan as well.  We will also obtain a TSH since patient to rule out hyperthyroidism as a cause.

## 2019-09-16 NOTE — Assessment & Plan Note (Addendum)
With concomitant anxiety, both improving on Wellbutrin 300 mg daily.  We will continue this medication since patient has improved on it.  She will follow up with me in about 3 months.

## 2019-09-19 LAB — CERVICOVAGINAL ANCILLARY ONLY
Bacterial Vaginitis (gardnerella): NEGATIVE
Candida Glabrata: NEGATIVE
Candida Vaginitis: NEGATIVE
Chlamydia: NEGATIVE
Comment: NEGATIVE
Comment: NEGATIVE
Comment: NEGATIVE
Comment: NEGATIVE
Comment: NEGATIVE
Comment: NORMAL
Neisseria Gonorrhea: NEGATIVE
Trichomonas: NEGATIVE

## 2019-09-20 ENCOUNTER — Telehealth: Payer: Self-pay | Admitting: Family Medicine

## 2019-09-20 NOTE — Telephone Encounter (Signed)
Spoke with Lori Weaver, and she stated she has never had Medicare. She said she never even applied for it.

## 2019-10-22 ENCOUNTER — Other Ambulatory Visit: Payer: Self-pay

## 2019-10-22 ENCOUNTER — Emergency Department (HOSPITAL_COMMUNITY)
Admission: EM | Admit: 2019-10-22 | Discharge: 2019-10-23 | Disposition: A | Discharge status: Home / Self Care | Payer: Medicaid Other | Attending: Emergency Medicine | Admitting: Emergency Medicine

## 2019-10-22 ENCOUNTER — Encounter (HOSPITAL_COMMUNITY): Payer: Self-pay

## 2019-10-22 DIAGNOSIS — R110 Nausea: Secondary | ICD-10-CM

## 2019-10-22 DIAGNOSIS — B349 Viral infection, unspecified: Secondary | ICD-10-CM | POA: Diagnosis not present

## 2019-10-22 DIAGNOSIS — R52 Pain, unspecified: Secondary | ICD-10-CM

## 2019-10-22 DIAGNOSIS — Z20822 Contact with and (suspected) exposure to covid-19: Secondary | ICD-10-CM | POA: Diagnosis not present

## 2019-10-22 DIAGNOSIS — M7918 Myalgia, other site: Secondary | ICD-10-CM | POA: Diagnosis present

## 2019-10-22 DIAGNOSIS — R519 Headache, unspecified: Secondary | ICD-10-CM

## 2019-10-22 DIAGNOSIS — Z79899 Other long term (current) drug therapy: Secondary | ICD-10-CM | POA: Diagnosis not present

## 2019-10-22 NOTE — ED Triage Notes (Signed)
Via EMS for c/o abdominal pain. In Er pt sts generalized body aches, shob, intermittent abdominal pain. Fever. Ibuprofen taken pta at 8pm.

## 2019-10-23 ENCOUNTER — Emergency Department (HOSPITAL_COMMUNITY): Payer: Medicaid Other

## 2019-10-23 LAB — COMPREHENSIVE METABOLIC PANEL
ALT: 20 U/L (ref 0–44)
AST: 25 U/L (ref 15–41)
Albumin: 4 g/dL (ref 3.5–5.0)
Alkaline Phosphatase: 74 U/L (ref 38–126)
Anion gap: 11 (ref 5–15)
BUN: 14 mg/dL (ref 6–20)
CO2: 23 mmol/L (ref 22–32)
Calcium: 8.7 mg/dL — ABNORMAL LOW (ref 8.9–10.3)
Chloride: 103 mmol/L (ref 98–111)
Creatinine, Ser: 0.89 mg/dL (ref 0.44–1.00)
GFR calc Af Amer: >60 mL/min (ref >60)
GFR calc non Af Amer: >60 mL/min (ref >60)
Glucose, Bld: 89 mg/dL (ref 70–99)
Potassium: 3.7 mmol/L (ref 3.5–5.1)
Sodium: 137 mmol/L (ref 135–145)
Total Bilirubin: 0.6 mg/dL (ref 0.3–1.2)
Total Protein: 7.2 g/dL (ref 6.5–8.1)

## 2019-10-23 LAB — CBC WITH DIFFERENTIAL/PLATELET
Abs Immature Granulocytes: 0.03 10*3/uL (ref 0.00–0.07)
Basophils Absolute: 0 10*3/uL (ref 0.0–0.1)
Basophils Relative: 0 %
Eosinophils Absolute: 0 10*3/uL (ref 0.0–0.5)
Eosinophils Relative: 0 %
HCT: 37.6 % (ref 36.0–46.0)
Hemoglobin: 12.3 g/dL (ref 12.0–15.0)
Immature Granulocytes: 1 %
Lymphocytes Relative: 10 %
Lymphs Abs: 0.6 10*3/uL — ABNORMAL LOW (ref 0.7–4.0)
MCH: 29.6 pg (ref 26.0–34.0)
MCHC: 32.7 g/dL (ref 30.0–36.0)
MCV: 90.4 fL (ref 80.0–100.0)
Monocytes Absolute: 0.7 10*3/uL (ref 0.1–1.0)
Monocytes Relative: 12 %
Neutro Abs: 4.3 10*3/uL (ref 1.7–7.7)
Neutrophils Relative %: 77 %
Platelets: 309 10*3/uL (ref 150–400)
RBC: 4.16 MIL/uL (ref 3.87–5.11)
RDW: 12.6 % (ref 11.5–15.5)
WBC: 5.6 10*3/uL (ref 4.0–10.5)
nRBC: 0 % (ref 0.0–0.2)

## 2019-10-23 LAB — SARS CORONAVIRUS 2 (TAT 6-24 HRS): SARS Coronavirus 2: NEGATIVE

## 2019-10-23 LAB — LIPASE, BLOOD: Lipase: 22 U/L (ref 11–51)

## 2019-10-23 LAB — I-STAT BETA HCG BLOOD, ED (MC, WL, AP ONLY): I-stat hCG, quantitative: <5 m[IU]/mL (ref <5)

## 2019-10-23 LAB — POC SARS CORONAVIRUS 2 AG -  ED: SARS Coronavirus 2 Ag: NEGATIVE

## 2019-10-23 MED ORDER — KETOROLAC TROMETHAMINE 15 MG/ML IJ SOLN
15.0000 mg | Freq: Once | INTRAMUSCULAR | Status: AC
  Administered 2019-10-23: 15 mg via INTRAVENOUS
  Filled 2019-10-23: qty 1

## 2019-10-23 MED ORDER — SODIUM CHLORIDE 0.9 % IV BOLUS
500.0000 mL | Freq: Once | INTRAVENOUS | Status: AC
  Administered 2019-10-23: 500 mL via INTRAVENOUS

## 2019-10-23 MED ORDER — ACETAMINOPHEN 500 MG PO TABS
1000.0000 mg | ORAL_TABLET | Freq: Once | ORAL | Status: AC
  Administered 2019-10-23: 1000 mg via ORAL
  Filled 2019-10-23: qty 2

## 2019-10-23 MED ORDER — ONDANSETRON HCL 4 MG/2ML IJ SOLN
4.0000 mg | Freq: Once | INTRAMUSCULAR | Status: AC
  Administered 2019-10-23: 4 mg via INTRAVENOUS
  Filled 2019-10-23: qty 2

## 2019-10-23 MED ORDER — SODIUM CHLORIDE 0.9 % IV BOLUS
1000.0000 mL | Freq: Once | INTRAVENOUS | Status: AC
  Administered 2019-10-23: 01:00:00 1000 mL via INTRAVENOUS

## 2019-10-23 MED ORDER — ONDANSETRON HCL 4 MG PO TABS: 4.0000 mg | ORAL_TABLET | Freq: Three times a day (TID) | ORAL | 0 refills | Status: DC | PRN

## 2019-10-23 NOTE — ED Provider Notes (Signed)
[facility]-EMERGENCY DEPT Provider Note   CSN: [id number] Arrival date & time: 10/22/19  2320     History Chief Complaint  Patient presents with  . Generalized Body Aches    Lori Weaver is a 24 y.o. female presenting for evaluation of generalized body aches, headache, shortness of breath, nausea.  Patient states her symptoms started yesterday.  They have gradually worsened throughout today.  She did not know that she had a fever, but was febrile on arrival to the ED.  Patient states her shortness of breath is mostly with exertion, and is more described as feeling very tired and weak with minimal activity.  She denies neck pain or stiffness, sore throat, chest pain, cough, abdominal pain, urinary symptoms, abnormal bowel movements.  She denies sick contacts or known contact with COVID-19 positive person.  She states she has a history of depression/anxiety for which she takes Wellbutrin, no other medical problems.  She denies tobacco, alcohol, or drug use.  She took 600 mg of ibuprofen as 1 dose last night, has not taken anything else for her symptoms.  HPI     Past Medical History:  Diagnosis Date  . Allergy   . Anemia   . Anxiety   . Chlamydia   . Depression   . Dysmenorrhea   . Gonorrhea     Patient Active Problem List   Diagnosis Date Noted  . Weight loss, unintentional 09/16/2019  . Abnormal uterine bleeding 08/16/2019  . 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 Postpartum care and examination 05/13/2019  . Post-dates pregnancy 04/09/2019  . Anemia in pregnancy 01/26/2019  . Supervision of other normal pregnancy, antepartum 09/15/2018  . Anxiety 09/15/2018  . Irritant contact dermatitis 01/19/2018  . Dyspareunia in female 01/19/2018  . Family history of lupus erythematosus 12/02/2017  . Depression 12/02/2017  . Acne vulgaris 12/02/2017    Past Surgical History:  Procedure Laterality Date  . LAPAROSCOPY N/A  05/06/2017   Procedure: LAPAROSCOPY DIAGNOSTIC;  Surgeon: Tereso Newcomer, MD;  Location: [facility];  Service: Gynecology;  Laterality: N/A;  . LAPAROSCOPY ABDOMEN DIAGNOSTIC    . TONSILLECTOMY AND ADENOIDECTOMY    . TYMPANOSTOMY TUBE PLACEMENT Bilateral   . WISDOM TOOTH EXTRACTION       OB History    Gravida  1   Para  1   Term  1   Preterm      AB      Living  1     SAB      TAB      Ectopic      Multiple  0   Live Births  1           Family History  Problem Relation Age of Onset  . Lupus Mother   . Depression Mother   . Anxiety disorder Mother   . Migraines Mother   . Lupus Maternal Grandmother     Social History   Tobacco Use  . Smoking status: Never Smoker  . Smokeless tobacco: Never Used  Substance Use Topics  . Alcohol use: Not Currently  . Drug use: Not Currently    Home Medications Prior to Admission medications   Medication Sig Start Date End Date Taking? Authorizing Provider  buPROPion (WELLBUTRIN XL) 300 MG 24 hr tablet Take 1 tablet (300 mg total) by mouth daily. 06/29/19  Yes Winfrey, Harlen Labs, MD  Ferrous Fumarate (HEMOCYTE) 324 (106 Fe) MG TABS tablet Take  1 tablet (106 mg of iron total) by mouth 2 (two) times daily. Patient not taking: Reported on 05/12/2019 01/26/19 05/26/19  Currie Paris, NP  ibuprofen (ADVIL) 600 MG tablet Take 1 tablet (600 mg total) by mouth every 6 (six) hours. 06/29/19   Lennox Solders, MD  ondansetron (ZOFRAN) 4 MG tablet Take 1 tablet (4 mg total) by mouth every 8 (eight) hours as needed. 10/23/19   Terrell Ostrand, PA-C  Prenatal Vit-Fe Fumarate-FA (PRENATAL MULTIVITAMIN) TABS tablet Take 1 tablet by mouth daily at 12 noon.    [provider]    Allergies    Fish allergy and Soap  Review of Systems   Review of Systems  Constitutional: Positive for fever.  Respiratory: Positive for shortness of breath.   Gastrointestinal: Positive for nausea.  Musculoskeletal: Positive  for myalgias.  Neurological: Positive for weakness and headaches.  All other systems reviewed and are negative.   Physical Exam Updated Vital Signs BP (!) 110/59   Pulse 94   Temp 98.9 F (37.2 C) (Oral)   Resp 19   Ht 4\' 10"  (1.473 m)   Wt 47.6 kg   SpO2 100%   BMI 21.95 kg/m   Physical Exam Vitals and nursing note reviewed.  Constitutional:      General: She is not in acute distress.    Appearance: She is well-developed. She is ill-appearing.     Comments: Appears ill, but nontoxic  HENT:     Head: Normocephalic and atraumatic.  Eyes:     Extraocular Movements: Extraocular movements intact.     Conjunctiva/sclera: Conjunctivae normal.     Pupils: Pupils are equal, round, and reactive to light.  Cardiovascular:     Rate and Rhythm: Normal rate and regular rhythm.     Pulses: Normal pulses.     Comments: Heart rate normal on my exam Pulmonary:     Effort: Pulmonary effort is normal. No respiratory distress.     Breath sounds: Normal breath sounds. No wheezing.     Comments: Speaking in full sentences.  Clear lung sounds in all fields.  No signs of respiratory distress or accessory muscle use.  Sats stable on room air. Abdominal:     General: There is no distension.     Palpations: Abdomen is soft. There is no mass.     Tenderness: There is no abdominal tenderness. There is no guarding or rebound.     Comments: No tenderness palpation the abdomen.  Musculoskeletal:        General: Normal range of motion.     Cervical back: Normal range of motion and neck supple.  Skin:    General: Skin is warm and dry.     Capillary Refill: Capillary refill takes less than 2 seconds.  Neurological:     Mental Status: She is alert and oriented to person, place, and time.     ED Results / Procedures / Treatments   Labs (all labs ordered are listed, but only abnormal results are displayed) Labs Reviewed  CBC WITH DIFFERENTIAL/PLATELET - Abnormal; Notable for the following  components:      Result Value   Lymphs Abs 0.6 (*)    All other components within normal limits  COMPREHENSIVE METABOLIC PANEL - Abnormal; Notable for the following components:   Calcium 8.7 (*)    All other components within normal limits  SARS CORONAVIRUS 2 (TAT 6-24 HRS)  LIPASE, BLOOD  I-STAT BETA HCG BLOOD, ED ([facility], [facility], [facility] ONLY)  POC SARS CORONAVIRUS 2 AG -  ED    EKG None  Radiology DG Chest Portable 1 View  Result Date: 10/23/2019 CLINICAL DATA:  Shortness of breath and fevers EXAM: PORTABLE CHEST 1 VIEW COMPARISON:  None. FINDINGS: The heart size and mediastinal contours are within normal limits. Both lungs are clear. The visualized skeletal structures are unremarkable. IMPRESSION: No active disease. Electronically Signed   By: Inez Catalina M.D.   On: 10/23/2019 00:53    Procedures Procedures (including critical care time)  Medications Ordered in ED Medications  sodium chloride 0.9 % bolus 1,000 mL (0 mLs Intravenous Stopped 10/23/19 0128)  ondansetron (ZOFRAN) injection 4 mg (4 mg Intravenous Given 10/23/19 0043)  acetaminophen (TYLENOL) tablet 1,000 mg (1,000 mg Oral Given 10/23/19 0040)  ketorolac (TORADOL) 15 MG/ML injection 15 mg (15 mg Intravenous Given 10/23/19 0221)  sodium chloride 0.9 % bolus 500 mL (0 mLs Intravenous Stopped 10/23/19 0326)    ED Course  I have reviewed the triage vital signs and the nursing notes.  Pertinent labs & imaging results that were available during my care of the patient were reviewed by me and considered in my medical decision making (see chart for details).    MDM Rules/Calculators/A&P                      Pt presenting for evaluation of headache, shortness of breath, nausea, and generalized body aches.  No cough, however I still have high suspicion for Covid.  If not Covid, likely viral illness.  However considering tachycardia and patient's ill appearance, will obtain labs to ensure no significant dehydration/creatinine abnormality or  sign of endorgan damage.  Will obtain chest x-ray due to shortness of breath.  Will treat symptomatically.  Chest x-ray viewed interpreted by me, no pneumonia pneumothorax and effusion, cardiomegaly.  Labs interpreted by me, reassuring.  No leukocytosis, electrolytes stable.  Heart rate and fever improving as expected.  As tachycardia has resolved with antipyretics, and patient was without signs of respiratory distress or chest pain, I have very low suspicion for PE.  I do not believe she needs a D-dimer or CTA at this time.  On reassessment, patient reports symptoms are improved, but not completely resolved.  Will give further fluid and Toradol, p.o. challenge, and reassess.  On reassessment, patient reports symptoms continue to improve.  She has tolerated p.o. without difficulty.  Discussed continued symptomatic treatment at home.  Discussed high suspicion for Covid test.  Rapid Covid test was negative in the ED, will perform PCR test.  Discussed quarantine if positive.  At this time, patient appears safe for discharge.  Return precautions given.  Patient states she understands and agrees to plan.  Final Clinical Impression(s) / ED Diagnoses Final diagnoses:  Viral illness  Acute nonintractable headache, unspecified headache type  Generalized body aches  Nausea    Rx / DC Orders ED Discharge Orders         Ordered    ondansetron (ZOFRAN) 4 MG tablet  Every 8 hours PRN     10/23/19 Linganore, Osher Oettinger, PA-C 10/23/19 0336    Molpus, Jenny Reichmann, MD 10/23/19 [phone]

## 2019-10-23 NOTE — Discharge Instructions (Signed)
You likely have a viral illness.  This should be treated symptomatically. Use Tylenol or ibuprofen as needed for fevers, headaches, or body aches. Use Zofran as needed for nausea or vomiting. Make sure you stay well-hydrated with water. Wash your hands frequently to prevent spread of infection. You are being tested for coronavirus.  If positive, you will see a phone call.  If negative, you will not.  Either way, you may check online on MyChart. If positive, you will need to quarantine for a total of 10 days from symptom onset.  You may end quarantine if you are fever free and symptoms are improving.  If test is negative, you do not need to quarantine.  However it is recommended that you do stay at home and avoid public places until your fever has completely resolved. Return to the emergency room if you develop chest pain, difficulty breathing, or any new or worsening symptoms.

## 2019-11-03 ENCOUNTER — Encounter: Payer: Self-pay | Admitting: Family Medicine

## 2019-11-03 ENCOUNTER — Other Ambulatory Visit: Payer: Self-pay

## 2019-11-03 ENCOUNTER — Ambulatory Visit (INDEPENDENT_AMBULATORY_CARE_PROVIDER_SITE_OTHER): Payer: Medicaid Other | Admitting: Family Medicine

## 2019-11-03 VITALS — BP 92/50 | HR 95 | Ht <= 58 in | Wt 103.6 lb

## 2019-11-03 DIAGNOSIS — Z3200 Encounter for pregnancy test, result unknown: Secondary | ICD-10-CM

## 2019-11-03 DIAGNOSIS — Z7251 High risk heterosexual behavior: Secondary | ICD-10-CM | POA: Diagnosis not present

## 2019-11-03 DIAGNOSIS — R634 Abnormal weight loss: Secondary | ICD-10-CM

## 2019-11-03 HISTORY — DX: Encounter for pregnancy test, result unknown: Z32.00

## 2019-11-03 LAB — POCT URINE PREGNANCY: Preg Test, Ur: NEGATIVE

## 2019-11-03 MED ORDER — BUPROPION HCL ER (XL) 150 MG PO TB24: 150.0000 mg | ORAL_TABLET | Freq: Every day | ORAL | 5 refills | Status: DC

## 2019-11-03 MED ORDER — BUPROPION HCL ER (XL) 150 MG PO TB24: 300.0000 mg | ORAL_TABLET | Freq: Every day | ORAL | 5 refills | Status: DC

## 2019-11-03 NOTE — Assessment & Plan Note (Deleted)
Appears to be well controlled while on bupropion, however she has lost about 20 pounds in the last 4 months.  I do think that the combination of her breast-feeding and taking bupropion is the most likely cause.  We will decrease her dose of bupropion to 150 mg daily at this time.  She will follow up in 1 month if she continues to have poor appetite.

## 2019-11-03 NOTE — Progress Notes (Signed)
    SUBJECTIVE:   CHIEF COMPLAINT / HPI:   Weight loss Patient notes that she continues to lose weight while on bupropion.  She says that her appetite is not as good as it normally is, and this has been the case while she has been on bupropion 300 mg daily.  She says that this occurred previously when she was on the higher dose of bupropion.  She continues to breast-feed frequently and plans to continue breast-feeding for the foreseeable future.  She denies nausea, changes in her bowel habits, fevers, chills, and night sweats.  She says that her mood has continued to be improved on bupropion and would like to continue it.  Possible pregnancy Patient says that she would like a pregnancy test today because her periods are irregular.  She is not currently interested in starting birth control, but she does not want to become pregnant at this time.  She is having unprotected sex with one female partner.  PERTINENT  PMH / PSH: depression, anxiety  OBJECTIVE:   BP (!) 92/50   Pulse 95   Ht 4\' 10"  (1.473 m)   Wt 103 lb 9.6 oz (47 kg)   LMP 10/19/2019 (Exact Date)   SpO2 100%   BMI 21.65 kg/m   General: well appearing, appears stated age Cardiac: RRR, no MRG Respiratory: CTAB, no rhonchi, rales, or wheezing, normal work of breathing Skin: no rashes or other lesions, warm and well perfused Psych: appropriate mood and affect  Depression screen [facility] 2/9 11/03/2019 11/03/2019 09/16/2019  Decreased Interest 0 0 0  Down, Depressed, Hopeless 0 0 1  PHQ - 2 Score 0 0 1  Altered sleeping 0 - 0  Tired, decreased energy 1 - 0  Change in appetite 3 - 1  Feeling bad or failure about yourself  0 - 0  Trouble concentrating 0 - 0  Moving slowly or fidgety/restless 0 - 0  Suicidal thoughts 0 - 0  PHQ-9 Score 4 - 2  Difficult doing work/chores Very difficult - Somewhat difficult  Some recent data might be hidden    ASSESSMENT/PLAN:   Weight loss, unintentional Her depression appears to be well  controlled while on bupropion, however she has lost about 20 pounds in the last 4 months.  I do think that the combination of her breast-feeding and taking bupropion is the most likely cause.  We will decrease her dose of bupropion to 150 mg daily at this time.  She will follow up in 1 month if she continues to have poor appetite.  Possible pregnancy Will obtain urine pregnancy test at patient request today.  Encouraged patient to discuss with her partner that he needs to respect her body by wearing a condom during every sexual encounter since that she does not desire to be pregnant.  Also encouraged her to consider starting birth control and to discuss this with me if and when she feels ready.     09/18/2019, MD [facility]

## 2019-11-03 NOTE — Assessment & Plan Note (Signed)
Her depression appears to be well controlled while on bupropion, however she has lost about 20 pounds in the last 4 months.  I do think that the combination of her breast-feeding and taking bupropion is the most likely cause.  We will decrease her dose of bupropion to 150 mg daily at this time.  She will follow up in 1 month if she continues to have poor appetite.

## 2019-11-03 NOTE — Assessment & Plan Note (Addendum)
Will obtain urine pregnancy test at patient request today.  Encouraged patient to discuss with her partner that he needs to respect her body by wearing a condom during every sexual encounter since that she does not desire to be pregnant.  Also encouraged her to consider starting birth control and to discuss this with me if and when she feels ready.

## 2019-11-03 NOTE — Patient Instructions (Addendum)
It was nice meeting you today Ms. Doering!  We will decrease your bupropion from 300 mg daily to 150 mg daily.  I have sent the new prescription to your pharmacy.  You can continue to take 1 pill/day since it will have half of the medication and not till now.  I will be in touch about your pregnancy test result and will send the results through MyChart.  Please let me know if you are interested in starting birth control.  If you have any questions or concerns, please feel free to call the clinic.   Be well,  Dr. Frances Furbish

## 2019-11-17 DIAGNOSIS — F341 Dysthymic disorder: Secondary | ICD-10-CM | POA: Diagnosis not present

## 2019-11-24 DIAGNOSIS — F341 Dysthymic disorder: Secondary | ICD-10-CM | POA: Diagnosis not present

## 2019-12-01 DIAGNOSIS — F341 Dysthymic disorder: Secondary | ICD-10-CM | POA: Diagnosis not present

## 2019-12-04 ENCOUNTER — Encounter: Payer: Self-pay | Admitting: Family Medicine

## 2019-12-06 ENCOUNTER — Ambulatory Visit: Payer: Medicaid Other

## 2019-12-07 ENCOUNTER — Ambulatory Visit (INDEPENDENT_AMBULATORY_CARE_PROVIDER_SITE_OTHER): Payer: Medicaid Other | Admitting: Family Medicine

## 2019-12-07 ENCOUNTER — Other Ambulatory Visit: Payer: Self-pay

## 2019-12-07 VITALS — BP 94/52 | HR 59 | Ht <= 58 in | Wt 103.6 lb

## 2019-12-07 DIAGNOSIS — F330 Major depressive disorder, recurrent, mild: Secondary | ICD-10-CM | POA: Diagnosis not present

## 2019-12-07 DIAGNOSIS — Z7251 High risk heterosexual behavior: Secondary | ICD-10-CM | POA: Insufficient documentation

## 2019-12-07 NOTE — Assessment & Plan Note (Signed)
Since patient continues to do well on her current dose of bupropion and her weight is stable from her last visit over one month ago, we will continue bupropion 150 mg for now.  Patient will weigh herself every few days to 1 week and let me know if her weight drops 5 pounds or more.  We discussed including a protein and a carbohydrate at each meal, and I reassured her that she is eating enough to maintain a normal BMI and to breast-feed her daughter.

## 2019-12-07 NOTE — Progress Notes (Signed)
    SUBJECTIVE:   CHIEF COMPLAINT / HPI:   Weight loss Patient reports that she has had some more weight loss at home.  She is happy that her weight today is the same as it was in April however.  She continues bupropion 150 mg daily and states that it continues to help her with her mood.  She wants to make sure that she does not lose any more weight.  She is having regular menstrual periods for the most part.  She is breast-feeding and does not want to become pregnant anytime soon.  PERTINENT  PMH / PSH: Anxiety and depression, weight loss  OBJECTIVE:   BP (!) 94/52   Pulse (!) 59   Ht 4\' 10"  (1.473 m)   Wt 103 lb 9.6 oz (47 kg)   LMP 11/20/2019   SpO2 100%   BMI 21.65 kg/m   General: well appearing, appears stated age Psych: appropriate mood and affect   Depression screen [facility] 2/9 12/07/2019  Decreased Interest 0  Down, Depressed, Hopeless 0  PHQ - 2 Score 0  Altered sleeping 1  Tired, decreased energy 1  Change in appetite 2  Feeling bad or failure about yourself  0  Trouble concentrating 0  Moving slowly or fidgety/restless 0  Suicidal thoughts 0  PHQ-9 Score 4  Difficult doing work/chores Very difficult  Some recent data might be hidden   GAD 7 : Generalized Anxiety Score 12/07/2019 09/16/2019 08/15/2019 08/15/2019  Nervous, Anxious, on Edge 0 1 1 1   Control/stop worrying 0 0 1 1  Worry too much - different things 0 0 2 2  Trouble relaxing 0 0 1 1  Restless 0 0 0 0  Easily annoyed or irritable 1 1 2 2   Afraid - awful might happen 0 0 0 0  Total GAD 7 Score 1 2 7 7   Anxiety Difficulty Somewhat difficult Somewhat difficult Very difficult Very difficult    ASSESSMENT/PLAN:   Depression Since patient continues to do well on her current dose of bupropion and her weight is stable from her last visit over one month ago, we will continue bupropion 150 mg for now.  Patient will weigh herself every few days to 1 week and let me know if her weight drops 5 pounds or more.  We  discussed including a protein and a carbohydrate at each meal, and I reassured her that she is eating enough to maintain a normal BMI and to breast-feed her daughter.  Unprotected sex Since Ms. Hendley does not want to become pregnant anytime soon and is having regular periods, I strongly encouraged her to consider a form of birth control so that she will no longer have to worry about becoming pregnant.  I told her there are various methods that are safe during breast-feeding.  She will continue to think about this and will let me know if she would like to pursue this option     , MD [facility]

## 2019-12-07 NOTE — Assessment & Plan Note (Signed)
Since Lori Weaver does not want to become pregnant anytime soon and is having regular periods, I strongly encouraged her to consider a form of birth control so that she will no longer have to worry about becoming pregnant.  I told her there are various methods that are safe during breast-feeding.  She will continue to think about this and will let me know if she would like to pursue this option

## 2019-12-08 DIAGNOSIS — F341 Dysthymic disorder: Secondary | ICD-10-CM | POA: Diagnosis not present

## 2019-12-15 DIAGNOSIS — F341 Dysthymic disorder: Secondary | ICD-10-CM | POA: Diagnosis not present

## 2020-01-16 DIAGNOSIS — F341 Dysthymic disorder: Secondary | ICD-10-CM | POA: Diagnosis not present

## 2020-01-17 ENCOUNTER — Encounter: Payer: Self-pay | Admitting: Family Medicine

## 2020-01-17 ENCOUNTER — Other Ambulatory Visit (HOSPITAL_COMMUNITY)
Admission: RE | Admit: 2020-01-17 | Discharge: 2020-01-17 | Disposition: A | Discharge status: Home / Self Care | Payer: Medicaid Other | Source: Ambulatory Visit | Attending: Family Medicine | Admitting: Family Medicine

## 2020-01-17 ENCOUNTER — Other Ambulatory Visit: Payer: Self-pay

## 2020-01-17 ENCOUNTER — Ambulatory Visit (INDEPENDENT_AMBULATORY_CARE_PROVIDER_SITE_OTHER): Payer: Medicaid Other | Admitting: Family Medicine

## 2020-01-17 VITALS — BP 96/58 | HR 86 | Ht <= 58 in | Wt 104.2 lb

## 2020-01-17 DIAGNOSIS — F330 Major depressive disorder, recurrent, mild: Secondary | ICD-10-CM | POA: Diagnosis not present

## 2020-01-17 DIAGNOSIS — Z7251 High risk heterosexual behavior: Secondary | ICD-10-CM | POA: Insufficient documentation

## 2020-01-17 LAB — POCT WET PREP (WET MOUNT)
Clue Cells Wet Prep Whiff POC: NEGATIVE
Trichomonas Wet Prep HPF POC: ABSENT

## 2020-01-17 LAB — POCT URINE PREGNANCY: Preg Test, Ur: NEGATIVE

## 2020-01-17 NOTE — Progress Notes (Signed)
    SUBJECTIVE:   CHIEF COMPLAINT / HPI:   STI testing Patient reports no vaginal symptoms, but she regularly gets tested for STIs, so she would like for this to be done today.  Her LMP was on June 8, and she is sexually active with one female partner.  She is unsure if he is sexually active with other people, so she would like to get tested today.  She would also like a pregnancy test just to make sure that she is not pregnant and would also like a wet prep and HIV and RPR testing today.  She continues to breast-feed regularly but has had several periods since her baby's birth in September.  She continues to not be interested in birth control at this time.  Depression Patient would like to wean off of welbutrin around her daughter's first birthday, which would be September 2021.  She says that she is currently doing well on this medication, and she is also taking supplements such as sea moss to help with her symptoms.  She notes that her weight has stabilized.  PERTINENT  PMH / PSH: Depression, chlamydia, gonorrhea, unprotected sex  OBJECTIVE:   BP (!) 96/58   Pulse 86   Ht 4\' 10"  (1.473 m)   Wt 104 lb 3.2 oz (47.3 kg)   LMP 12/27/2019 (Exact Date)   SpO2 100%   BMI 21.78 kg/m   General: well appearing, appears stated age GU: Normal-appearing female genitalia with normal-appearing discharge and no cervical motion tenderness.  No cervical lesions. Respiratory: normal work of breathing Skin: no rashes or other lesions, warm and well perfused Psych: appropriate mood and affect   ASSESSMENT/PLAN:   Unprotected sex Wet prep, GC/chlamydia, pregnancy test, HIV, and RPR performed today.  We will update patient once these results return.  Counseled patient on safe sex practices and encouraged her to consider birth control once she is ready.  Depression Well-controlled today.  We will continue Wellbutrin 150 mg until around September, when patient is interested on weaning off of this  medication.     October, MD [facility]

## 2020-01-17 NOTE — Assessment & Plan Note (Addendum)
Wet prep, GC/chlamydia, pregnancy test, HIV, and RPR performed today.  We will update patient once these results return.  Counseled patient on safe sex practices and encouraged her to consider birth control once she is ready.

## 2020-01-17 NOTE — Patient Instructions (Signed)
It was nice seeing you today Ms. Ennis!   Today, we did several tests, and we will let you know what these results are once they return.  Please return in June or earlier so that you can discuss your Wellbutrin with your provider at that time.  It has been a pleasure taking care of you, and I wish you the very best.  If you have any questions or concerns, please feel free to call the clinic.   Be well,  Dr. Frances Furbish

## 2020-01-17 NOTE — Assessment & Plan Note (Signed)
Well-controlled today.  We will continue Wellbutrin 150 mg until around September, when patient is interested on weaning off of this medication.

## 2020-01-18 ENCOUNTER — Other Ambulatory Visit: Payer: Self-pay | Admitting: Family Medicine

## 2020-01-18 ENCOUNTER — Encounter: Payer: Self-pay | Admitting: Family Medicine

## 2020-01-18 DIAGNOSIS — L700 Acne vulgaris: Secondary | ICD-10-CM

## 2020-01-18 LAB — CERVICOVAGINAL ANCILLARY ONLY
Chlamydia: NEGATIVE
Comment: NEGATIVE
Comment: NORMAL
Neisseria Gonorrhea: NEGATIVE

## 2020-01-18 MED ORDER — CLINDAMYCIN PHOS-BENZOYL PEROX 1.2-5 % EX GEL: CUTANEOUS | 0 refills | Status: DC

## 2020-01-18 MED ORDER — ADAPALENE-BENZOYL PEROXIDE 0.1-2.5 % EX GEL: 1.0000 "application " | Freq: Every day | CUTANEOUS | 2 refills | Status: DC

## 2020-01-20 ENCOUNTER — Other Ambulatory Visit: Payer: Self-pay | Admitting: Family Medicine

## 2020-01-20 LAB — RPR: RPR Ser Ql: NONREACTIVE

## 2020-01-20 LAB — HIV ANTIBODY (ROUTINE TESTING W REFLEX): HIV Screen 4th Generation wRfx: NONREACTIVE
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
## 2020-02-05 ENCOUNTER — Emergency Department (HOSPITAL_COMMUNITY): Payer: Medicaid Other

## 2020-02-05 ENCOUNTER — Emergency Department (HOSPITAL_COMMUNITY)
Admission: EM | Admit: 2020-02-05 | Discharge: 2020-02-05 | Disposition: A | Discharge status: Home / Self Care | Payer: Medicaid Other | Attending: Emergency Medicine | Admitting: Emergency Medicine

## 2020-02-05 ENCOUNTER — Other Ambulatory Visit: Payer: Self-pay

## 2020-02-05 DIAGNOSIS — R079 Chest pain, unspecified: Secondary | ICD-10-CM

## 2020-02-05 DIAGNOSIS — J90 Pleural effusion, not elsewhere classified: Secondary | ICD-10-CM | POA: Diagnosis not present

## 2020-02-05 DIAGNOSIS — R0602 Shortness of breath: Secondary | ICD-10-CM | POA: Diagnosis not present

## 2020-02-05 DIAGNOSIS — R0789 Other chest pain: Secondary | ICD-10-CM | POA: Diagnosis not present

## 2020-02-05 LAB — BASIC METABOLIC PANEL
Anion gap: 9 (ref 5–15)
BUN: 10 mg/dL (ref 6–20)
CO2: 26 mmol/L (ref 22–32)
Calcium: 9.4 mg/dL (ref 8.9–10.3)
Chloride: 104 mmol/L (ref 98–111)
Creatinine, Ser: 0.85 mg/dL (ref 0.44–1.00)
GFR calc Af Amer: >60 mL/min (ref >60)
GFR calc non Af Amer: >60 mL/min (ref >60)
Glucose, Bld: 78 mg/dL (ref 70–99)
Potassium: 3.9 mmol/L (ref 3.5–5.1)
Sodium: 139 mmol/L (ref 135–145)

## 2020-02-05 LAB — CBC
HCT: 39.2 % (ref 36.0–46.0)
Hemoglobin: 12.4 g/dL (ref 12.0–15.0)
MCH: 28.8 pg (ref 26.0–34.0)
MCHC: 31.6 g/dL (ref 30.0–36.0)
MCV: 91 fL (ref 80.0–100.0)
Platelets: 336 10*3/uL (ref 150–400)
RBC: 4.31 MIL/uL (ref 3.87–5.11)
RDW: 12.1 % (ref 11.5–15.5)
WBC: 4.8 10*3/uL (ref 4.0–10.5)
nRBC: 0 % (ref 0.0–0.2)

## 2020-02-05 LAB — POC URINE PREG, ED: Preg Test, Ur: NEGATIVE

## 2020-02-05 LAB — TROPONIN I (HIGH SENSITIVITY)
Troponin I (High Sensitivity): <2 ng/L (ref <18)
Troponin I (High Sensitivity): <2 ng/L (ref <18)

## 2020-02-05 MED ORDER — NAPROXEN 500 MG PO TABS
500.0000 mg | ORAL_TABLET | Freq: Two times a day (BID) | ORAL | 0 refills | Status: DC
Start: 2020-02-05 — End: 2020-05-08

## 2020-02-05 MED ORDER — KETOROLAC TROMETHAMINE 30 MG/ML IJ SOLN
30.0000 mg | Freq: Once | INTRAMUSCULAR | Status: AC
  Administered 2020-02-05: 30 mg via INTRAMUSCULAR
  Filled 2020-02-05: qty 1

## 2020-02-05 MED ORDER — SODIUM CHLORIDE 0.9% FLUSH: 3.0000 mL | Freq: Once | INTRAVENOUS | Status: DC

## 2020-02-05 NOTE — Discharge Instructions (Signed)
You were seen in the emergency department today for chest pain. Your work-up in the emergency department has been overall reassuring. Your labs have been fairly normal and or similar to previous blood work you have had done. Your EKG and the enzyme we use to check your heart did not show an acute heart attack at this time. Your chest x-ray was normal.   I suspect your pain is musculoskeletal, may be costochondritis, take naproxen twice daily for the next 1-2 weeks.  Follow-up with PCP if not improving.  We would like you to follow up closely with your primary care provider within a week. Return to the ER immediately should you experience any new or worsening symptoms including but not limited to return of pain, worsened pain, vomiting, shortness of breath, dizziness, lightheadedness, passing out, or any other concerns that you may have.

## 2020-02-05 NOTE — ED Notes (Signed)
Patient has a blue top in the main lab °

## 2020-02-05 NOTE — ED Triage Notes (Signed)
Patient reports chest pain in sternum starting last night. Denies n/v. Denies radiation. Took ibuprofen but not working, pain 8/10

## 2020-02-05 NOTE — ED Provider Notes (Signed)
[facility]-EMERGENCY DEPT Provider Note   CSN: [id number] Arrival date & time: 02/05/20  1359     History Chief Complaint  Patient presents with  . Chest Pain    Lori Weaver is a 24 y.o. female.  Lori Weaver is a 24 y.o. female with a history of anemia, anxiety, depression, who presents to the emergency department for evaluation of chest pain.  She reports central chest pain over the sternum that started about 8 PM last night.  Patient states pain has been constant since then she describes it as a soreness and ache that is worse with movement.  She reports is also sometimes worse with breathing but she does not feel short of breath.  Denies any cough or fever.  Denies any associated lightheadedness or syncope.  Pain is not ripping or tearing in nature.  Pain is not worse with exertion.  She denies any associated lower extremity swelling or pain.  No nausea vomiting or abdominal pain.  States that she took 1 dose of ibuprofen without much improvement, has not tried anything else to treat symptoms.  Reports she has had some similar pain before but never this severe.  Is currently breast-feeding.  No history of PE or DVT, not on birth control, no recent long distance surgery or travel, no history of smoking, denies marijuana or other drug use.        Past Medical History:  Diagnosis Date  . Allergy   . Anemia   . Anxiety   . Chlamydia   . Depression   . Dysmenorrhea   . Gonorrhea     Patient Active Problem List   Diagnosis Date Noted  . Unprotected sex 12/07/2019  . Possible pregnancy 11/03/2019  . Weight loss, unintentional 09/16/2019  . Abnormal uterine bleeding 08/16/2019  . 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 Postpartum care and examination 05/13/2019  . Post-dates pregnancy 04/09/2019  . Anemia in pregnancy 01/26/2019  . Supervision of other normal pregnancy, antepartum 09/15/2018  . Anxiety 09/15/2018  . Irritant  contact dermatitis 01/19/2018  . Dyspareunia in female 01/19/2018  . Family history of lupus erythematosus 12/02/2017  . Depression 12/02/2017  . Acne vulgaris 12/02/2017    Past Surgical History:  Procedure Laterality Date  . LAPAROSCOPY N/A 05/06/2017   Procedure: LAPAROSCOPY DIAGNOSTIC;  Surgeon: Tereso NewcomerAnyanwu, Ugonna A, MD;  Location: [facility];  Service: Gynecology;  Laterality: N/A;  . LAPAROSCOPY ABDOMEN DIAGNOSTIC    . TONSILLECTOMY AND ADENOIDECTOMY    . TYMPANOSTOMY TUBE PLACEMENT Bilateral   . WISDOM TOOTH EXTRACTION       OB History    Gravida  1   Para  1   Term  1   Preterm      AB      Living  1     SAB      TAB      Ectopic      Multiple  0   Live Births  1           Family History  Problem Relation Age of Onset  . Lupus Mother   . Depression Mother   . Anxiety disorder Mother   . Migraines Mother   . Lupus Maternal Grandmother     Social History   Tobacco Use  . Smoking status: Never Smoker  . Smokeless tobacco: Never Used  Vaping Use  . Vaping Use: Never used  Substance Use Topics  .  Alcohol use: Not Currently  . Drug use: Not Currently    Home Medications Prior to Admission medications   Medication Sig Start Date End Date Taking? Authorizing Provider  buPROPion (WELLBUTRIN XL) 150 MG 24 hr tablet Take 1 tablet (150 mg total) by mouth daily. 11/03/19  Yes Winfrey, Harlen Labs, MD  ibuprofen (ADVIL) 600 MG tablet TAKE 1 TABLET(600 MG) BY MOUTH EVERY 6 HOURS Patient taking differently: Take 600 mg by mouth every 6 (six) hours as needed for moderate pain.  01/24/20  Yes Fayette Pho, MD  Adapalene-Benzoyl Peroxide 0.1-2.5 % gel Apply 1 application topically at bedtime. Patient not taking: Reported on 02/05/2020 01/18/20   Lennox Solders, MD  Clindamycin-Benzoyl Per, Refr, (DUAC) gel Apply thin layer to face each night. Patient not taking: Reported on 02/05/2020 01/18/20   Fayette Pho, MD  Ferrous Fumarate (HEMOCYTE)  324 (106 Fe) MG TABS tablet Take 1 tablet (106 mg of iron total) by mouth 2 (two) times daily. Patient not taking: Reported on 05/12/2019 01/26/19 05/26/19  Currie Paris, NP  naproxen (NAPROSYN) 500 MG tablet Take 1 tablet (500 mg total) by mouth 2 (two) times daily. 02/05/20   Dartha Lodge, PA-C  ondansetron (ZOFRAN) 4 MG tablet Take 1 tablet (4 mg total) by mouth every 8 (eight) hours as needed. 10/23/19   Caccavale, Sophia, PA-C  Prenatal Vit-Fe Fumarate-FA (PRENATAL MULTIVITAMIN) TABS tablet Take 1 tablet by mouth daily at 12 noon.    [provider]    Allergies    Fish allergy and Soap  Review of Systems   Review of Systems  Constitutional: Negative for chills and fever.  HENT: Negative.   Respiratory: Negative for cough and shortness of breath.   Cardiovascular: Positive for chest pain. Negative for palpitations and leg swelling.  Gastrointestinal: Negative for abdominal pain, nausea and vomiting.  Genitourinary: Negative for dysuria and frequency.  Musculoskeletal: Negative for arthralgias and myalgias.  Skin: Negative for color change and rash.  Neurological: Negative for dizziness, syncope and light-headedness.  All other systems reviewed and are negative.   Physical Exam Updated Vital Signs BP 104/64 (BP Location: Right Arm)   Pulse 82   Temp 99.1 F (37.3 C) (Oral)   Resp 18   Ht 4\' 10"  (1.473 m)   Wt 46.5 kg   LMP 12/21/2019   SpO2 100%   BMI 21.44 kg/m   Physical Exam Vitals and nursing note reviewed.  Constitutional:      General: She is not in acute distress.    Appearance: She is well-developed and normal weight. She is not ill-appearing or diaphoretic.  HENT:     Head: Normocephalic and atraumatic.  Eyes:     General:        Right eye: No discharge.        Left eye: No discharge.     Pupils: Pupils are equal, round, and reactive to light.  Cardiovascular:     Rate and Rhythm: Normal rate and regular rhythm.     Pulses:          Radial  pulses are 2+ on the right side and 2+ on the left side.       Dorsalis pedis pulses are 2+ on the right side and 2+ on the left side.     Heart sounds: Normal heart sounds. No murmur heard.  No friction rub. No gallop.   Pulmonary:     Effort: Pulmonary effort is normal. No respiratory distress.  Breath sounds: Normal breath sounds. No wheezing or rales.     Comments: Respirations equal and unlabored, patient able to speak in full sentences, lungs clear to auscultation bilaterally Chest:     Chest wall: Tenderness present.     Comments: Tenderness over sternum that reproduces pain, patient is currently breast feeding but does not have any pain, swelling or redness over the breasts Abdominal:     General: Bowel sounds are normal. There is no distension.     Palpations: Abdomen is soft. There is no mass.     Tenderness: There is no abdominal tenderness. There is no guarding.  Musculoskeletal:        General: No deformity.     Cervical back: Neck supple.     Right lower leg: No tenderness. No edema.     Left lower leg: No tenderness. No edema.  Skin:    General: Skin is warm and dry.     Capillary Refill: Capillary refill takes less than 2 seconds.  Neurological:     Mental Status: She is alert.     Coordination: Coordination normal.     Comments: Speech is clear, able to follow commands Moves extremities without ataxia, coordination intact  Psychiatric:        Mood and Affect: Mood normal.        Behavior: Behavior normal.     ED Results / Procedures / Treatments   Labs (all labs ordered are listed, but only abnormal results are displayed) Labs Reviewed  BASIC METABOLIC PANEL  CBC  I-STAT BETA HCG BLOOD, ED ([facility], [facility], [facility] ONLY)  POC URINE PREG, ED  TROPONIN I (HIGH SENSITIVITY)  TROPONIN I (HIGH SENSITIVITY)    EKG EKG Interpretation  Date/Time:  Sunday February 05 2020 14:41:15 EDT Ventricular Rate:  71 PR Interval:  142 QRS Duration: 66 QT Interval:  364 QTC  Calculation: 395 R Axis:   86 Text Interpretation: Normal sinus rhythm with sinus arrhythmia Normal ECG No significant change since last tracing Confirmed by Richardean Canal [phone]) on 02/05/2020 10:46:03 PM   Radiology DG Chest 2 View  Result Date: 02/05/2020 CLINICAL DATA:  Pt reported intermittent center chest pains and SOB that started last night. EXAM: CHEST - 2 VIEW COMPARISON:  Chest radiograph 10/23/2019 FINDINGS: Normal cardiomediastinal contours. The lungs are clear. No pneumothorax or pleural effusion. No acute finding in the visualized skeleton. IMPRESSION: No acute cardiopulmonary process. Electronically Signed   By: Emmaline Kluver M.D.   On: 02/05/2020 15:23    Procedures Procedures (including critical care time)  Medications Ordered in ED Medications  ketorolac (TORADOL) 30 MG/ML injection 30 mg (30 mg Intramuscular Given 02/05/20 2119)    ED Course  I have reviewed the triage vital signs and the nursing notes.  Pertinent labs & imaging results that were available during my care of the patient were reviewed by me and considered in my medical decision making (see chart for details).    MDM Rules/Calculators/A&P                          Patient presents to the emergency department with chest pain. Patient nontoxic appearing, in no apparent distress, vitals without significant abnormality. Fairly benign physical exam.   No prior evaluations by cardiology.  DDX: ACS, pulmonary embolism, dissection, pneumothorax, pneumonia, arrhythmia, severe anemia, MSK, GERD, anxiety. Evaluation initiated with labs, EKG, and CXR. Patient on cardiac monitor.   CBC: No leukocytosis, normal hemoglobin BMP:  No acute electrolyte derangements, normal renal function Troponin: Negative, patient states pain has been constant since 8 PM last night, would expect troponin to be elevated at this point if ACS were present, no delta troponin indicated EKG: Normal sinus rhythm with some sinus  arrhythmia noted, no significant changes when compared to prior CXR: Negative, without infiltrate, effusion, pneumothorax, or fracture/dislocation.   Hearth Pathway Score 0- EKG without obvious acute ischemia, troponin negative, doubt ACS. Patient is low risk wells, PERC negative, doubt pulmonary embolism. Pain is not a tearing sensation, symmetric pulses, no widening of mediastinum on CXR, doubt dissection. Cardiac monitor reviewed, no notable arrhythmias or tachycardia. Patient has appeared hemodynamically stable throughout ER visit and appears safe for discharge with close PCP/cardiology follow up.  Suspect pain is musculoskeletal will treat with NSAIDs.  I discussed results, treatment plan, need for PCP follow-up, and return precautions with the patient. Provided opportunity for questions, patient confirmed understanding and is in agreement with plan.   Final Clinical Impression(s) / ED Diagnoses Final diagnoses:  Central chest pain    Rx / DC Orders ED Discharge Orders         Ordered    naproxen (NAPROSYN) 500 MG tablet  2 times daily     Discontinue  Reprint     02/05/20 2211           Dartha Lodge, PA-C 02/05/20 2328    Charlynne Pander, MD 02/05/20 [phone]

## 2020-02-06 ENCOUNTER — Telehealth: Payer: Self-pay | Admitting: *Deleted

## 2020-02-06 NOTE — Telephone Encounter (Signed)
Attempted to contact pt to complete transitions of care assessment; left message on voicemail.

## 2020-03-21 ENCOUNTER — Other Ambulatory Visit: Payer: Self-pay

## 2020-03-21 ENCOUNTER — Encounter (HOSPITAL_COMMUNITY): Payer: Self-pay | Admitting: Emergency Medicine

## 2020-03-21 ENCOUNTER — Ambulatory Visit (HOSPITAL_COMMUNITY)
Admission: EM | Admit: 2020-03-21 | Discharge: 2020-03-21 | Disposition: A | Discharge status: Home / Self Care | Payer: Medicaid Other | Attending: Family Medicine | Admitting: Family Medicine

## 2020-03-21 DIAGNOSIS — N760 Acute vaginitis: Secondary | ICD-10-CM | POA: Diagnosis not present

## 2020-03-21 MED ORDER — FLUCONAZOLE 150 MG PO TABS
ORAL_TABLET | ORAL | 0 refills | Status: DC
Start: 2020-03-21 — End: 2020-05-08

## 2020-03-21 NOTE — Discharge Instructions (Signed)
We have sent testing for sexually transmitted infections. We will notify you of any positive results once they are received. If required, we will prescribe any medications you might need.  Please refrain from all sexual activity for at least the next seven days.  

## 2020-03-21 NOTE — ED Triage Notes (Signed)
Pt presents with irritation around vaginal opening. Denies dysuria, blood in urine, abdominal pain xs 2 days.

## 2020-03-22 LAB — CERVICOVAGINAL ANCILLARY ONLY
Bacterial Vaginitis (gardnerella): NEGATIVE
Candida Glabrata: NEGATIVE
Candida Vaginitis: POSITIVE — AB
Chlamydia: NEGATIVE
Comment: NEGATIVE
Comment: NEGATIVE
Comment: NEGATIVE
Comment: NEGATIVE
Comment: NEGATIVE
Comment: NORMAL
Neisseria Gonorrhea: NEGATIVE
Trichomonas: NEGATIVE

## 2020-03-22 NOTE — ED Provider Notes (Signed)
[facility]   [id number] 03/21/20 Arrival Time: 1741  ASSESSMENT & PLAN:  1. Acute vaginitis     Meds ordered this encounter  Medications  . fluconazole (DIFLUCAN) 150 MG tablet    Sig: Take one tablet by mouth as a single dose. May repeat in 3 days if symptoms persist.    Dispense:  2 tablet    Refill:  0      Discharge Instructions      We have sent testing for sexually transmitted infections. We will notify you of any positive results once they are received. If required, we will prescribe any medications you might need.  Please refrain from all sexual activity for at least the next seven days.     Without s/s of PID.  Labs Reviewed  CERVICOVAGINAL ANCILLARY ONLY     Will notify of any positive results. Instructed to refrain from sexual activity for at least seven days.  Reviewed expectations re: course of current medical issues. Questions answered. Outlined signs and symptoms indicating need for more acute intervention. Patient verbalized understanding. After Visit Summary given.   SUBJECTIVE:  Lori Weaver is a 24 y.o. female who presents with complaint of vaginal irritation and itching. H/O yeast infection; "feels like it". Does desire STD testing. No pelvic/abd pain. Afebrile.. No urinary symptoms.  Patient's last menstrual period was 02/28/2020.   OBJECTIVE:  Vitals:   03/21/20 1913  BP: (!) 103/56  Pulse: 70  Resp: 18  Temp: 98.2 F (36.8 C)  TempSrc: Oral  SpO2: 100%     General appearance: alert, cooperative, appears stated age and no distress Lungs: unlabored respirations; speaks full sentences without difficulty Abdomen: soft GU: deferred Skin: warm and dry Psychological: alert and cooperative; normal mood and affect.    Labs Reviewed  CERVICOVAGINAL ANCILLARY ONLY    Allergies  Allergen Reactions  . Fish Allergy Anaphylaxis  . Soap Rash    Use for Pre-op    Past Medical History:  Diagnosis Date  . Allergy     . Anemia   . Anxiety   . Chlamydia   . Depression   . Dysmenorrhea   . Gonorrhea    Family History  Problem Relation Age of Onset  . Lupus Mother   . Depression Mother   . Anxiety disorder Mother   . Migraines Mother   . Lupus Maternal Grandmother    Social History   Socioeconomic History  . Marital status: Single    Spouse name: Not on file  . Number of children: Not on file  . Years of education: Not on file  . Highest education level: Not on file  Occupational History  . Not on file  Tobacco Use  . Smoking status: Never Smoker  . Smokeless tobacco: Never Used  Vaping Use  . Vaping Use: Never used  Substance and Sexual Activity  . Alcohol use: Not Currently  . Drug use: Not Currently  . Sexual activity: Yes    Birth control/protection: None  Other Topics Concern  . 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  Plans to get a masters degree.  Plans to go to PA school.   Social Determinants of Health   Financial Resource Strain:   . Difficulty of Paying Living Expenses: Not on file  Food Insecurity:   . Worried About Programme researcher, broadcasting/film/video in the Last Year: Not on file  .  Ran Out of Food in the Last Year: Not on file  Transportation Needs:   . Lack of Transportation (Medical): Not on file  . Lack of Transportation (Non-Medical): Not on file  Physical Activity:   . Days of Exercise per Week: Not on file  . Minutes of Exercise per Session: Not on file  Stress:   . Feeling of Stress : Not on file  Social Connections:   . Frequency of Communication with Friends and Family: Not on file  . Frequency of Social Gatherings with Friends and Family: Not on file  . Attends Religious Services: Not on file  . Active Member of Clubs or Organizations: Not on file  . Attends Banker Meetings: Not on file  . Marital Status: Not on file  Intimate Partner Violence:   . Fear of Current or  Ex-Partner: Not on file  . Emotionally Abused: Not on file  . Physically Abused: Not on file  . Sexually Abused: Not on file          Mardella Layman, MD 03/22/20 [phone]

## 2020-04-10 ENCOUNTER — Other Ambulatory Visit: Payer: Self-pay

## 2020-04-10 ENCOUNTER — Encounter (HOSPITAL_COMMUNITY): Payer: Self-pay

## 2020-04-10 ENCOUNTER — Ambulatory Visit (HOSPITAL_COMMUNITY)
Admission: EM | Admit: 2020-04-10 | Discharge: 2020-04-10 | Disposition: A | Discharge status: Home / Self Care | Payer: Medicaid Other | Attending: Physician Assistant | Admitting: Physician Assistant

## 2020-04-10 DIAGNOSIS — R05 Cough: Secondary | ICD-10-CM | POA: Diagnosis not present

## 2020-04-10 DIAGNOSIS — Z20822 Contact with and (suspected) exposure to covid-19: Secondary | ICD-10-CM | POA: Diagnosis not present

## 2020-04-10 DIAGNOSIS — F419 Anxiety disorder, unspecified: Secondary | ICD-10-CM | POA: Insufficient documentation

## 2020-04-10 DIAGNOSIS — Z79899 Other long term (current) drug therapy: Secondary | ICD-10-CM | POA: Insufficient documentation

## 2020-04-10 DIAGNOSIS — J029 Acute pharyngitis, unspecified: Secondary | ICD-10-CM | POA: Diagnosis not present

## 2020-04-10 DIAGNOSIS — B349 Viral infection, unspecified: Secondary | ICD-10-CM | POA: Diagnosis not present

## 2020-04-10 DIAGNOSIS — F329 Major depressive disorder, single episode, unspecified: Secondary | ICD-10-CM | POA: Diagnosis not present

## 2020-04-10 DIAGNOSIS — R0981 Nasal congestion: Secondary | ICD-10-CM

## 2020-04-10 LAB — POCT RAPID STREP A, ED / UC: Streptococcus, Group A Screen (Direct): NEGATIVE

## 2020-04-10 MED ORDER — ACETAMINOPHEN 325 MG PO TABS
650.0000 mg | ORAL_TABLET | Freq: Four times a day (QID) | ORAL | 0 refills | Status: AC | PRN
Start: 2020-04-10 — End: ?

## 2020-04-10 MED ORDER — CEPACOL SORE THROAT 5.4 MG MT LOZG
1.0000 | LOZENGE | OROMUCOSAL | 0 refills | Status: DC | PRN
Start: 2020-04-10 — End: 2020-05-08

## 2020-04-10 MED ORDER — GUAIFENESIN ER 600 MG PO TB12: 600.0000 mg | ORAL_TABLET | Freq: Two times a day (BID) | ORAL | 0 refills | Status: AC

## 2020-04-10 MED ORDER — BENZONATATE 100 MG PO CAPS
100.0000 mg | ORAL_CAPSULE | Freq: Three times a day (TID) | ORAL | 0 refills | Status: DC
Start: 2020-04-10 — End: 2020-05-08

## 2020-04-10 NOTE — Discharge Instructions (Signed)
Take the medications as prescribed -I recommend holding breast-feeding if you are transitioning this currently, you may also discuss with pediatrician prior to reinitiating any breast-feeding.  If symptoms are worsening with shortness of breath, high fevers or other concerning symptoms return or go to emergency department  If your Covid-19 test is positive, you will receive a phone call from [facility] regarding your results. Negative test results are not called. Both positive and negative results area always visible on MyChart. If you do not have a MyChart account, sign up instructions are in your discharge papers.   Persons who are directed to care for themselves at home may discontinue isolation under the following conditions:   At least 10 days have passed since symptom onset and  At least 24 hours have passed without running a fever (this means without the use of fever-reducing medications) and  Other symptoms have improved.  Persons infected with COVID-19 who never develop symptoms may discontinue isolation and other precautions 10 days after the date of their first positive COVID-19 test.

## 2020-04-10 NOTE — ED Provider Notes (Addendum)
[facility]    CSN: [id number] Arrival date & time: 04/10/20  1715      History   Chief Complaint Chief Complaint  Patient presents with  . Cough  . Sore Throat  . Nasal Congestion    HPI Lori Weaver is a 24 y.o. female.   Patient reports for sore throat, cough and nasal congestion for about 1 week.  She reports burning pain in her throat.  She reports cough has been mostly dry.  Denies shortness of breath.  She reports body aches and chills but no temperature at home.  She reports she has been able to eat and drink.  She is been taking over-the-counter medications with some relief.  Denies nausea, vomiting, diarrhea.  Denies abdominal pain.  No change in taste or smell.  No known sick contacts.  Not receive Covid vaccines.  She reports she is breast-feeding however she is attempting to wean the baby off.  She does report that the baby is with the child's father this week and she would not be breast-feeding until at least Friday.     Past Medical History:  Diagnosis Date  . Allergy   . Anemia   . Anxiety   . Chlamydia   . Depression   . Dysmenorrhea   . Gonorrhea     Patient Active Problem List   Diagnosis Date Noted  . Unprotected sex 12/07/2019  . Possible pregnancy 11/03/2019  . Weight loss, unintentional 09/16/2019  . Abnormal uterine bleeding 08/16/2019  . 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 Postpartum care and examination 05/13/2019  . Post-dates pregnancy 04/09/2019  . Anemia in pregnancy 01/26/2019  . Supervision of other normal pregnancy, antepartum 09/15/2018  . Anxiety 09/15/2018  . Irritant contact dermatitis 01/19/2018  . Dyspareunia in female 01/19/2018  . Family history of lupus erythematosus 12/02/2017  . Depression 12/02/2017  . Acne vulgaris 12/02/2017    Past Surgical History:  Procedure Laterality Date  . LAPAROSCOPY N/A 05/06/2017   Procedure: LAPAROSCOPY DIAGNOSTIC;  Surgeon: Tereso Newcomer, MD;  Location: [facility];  Service: Gynecology;  Laterality: N/A;  . LAPAROSCOPY ABDOMEN DIAGNOSTIC    . TONSILLECTOMY AND ADENOIDECTOMY    . TYMPANOSTOMY TUBE PLACEMENT Bilateral   . WISDOM TOOTH EXTRACTION      OB History    Gravida  1   Para  1   Term  1   Preterm      AB      Living  1     SAB      TAB      Ectopic      Multiple  0   Live Births  1            Home Medications    Prior to Admission medications   Medication Sig Start Date End Date Taking? Authorizing Provider  acetaminophen (TYLENOL) 325 MG tablet Take 2 tablets (650 mg total) by mouth every 6 (six) hours as needed. 04/10/20   Everett Ricciardelli, Veryl Speak, PA-C  Adapalene-Benzoyl Peroxide 0.1-2.5 % gel Apply 1 application topically at bedtime. Patient not taking: Reported on 02/05/2020 01/18/20   Lennox Solders, MD  benzonatate (TESSALON) 100 MG capsule Take 1 capsule (100 mg total) by mouth every 8 (eight) hours. 04/10/20   Sophea Rackham, Veryl Speak, PA-C  buPROPion (WELLBUTRIN XL) 150 MG 24 hr tablet Take 1 tablet (150 mg total) by mouth daily. 11/03/19   Lennox Solders, MD  Clindamycin-Benzoyl Per, Refr, (DUAC) gel Apply thin layer to face each night. Patient not taking: Reported on 02/05/2020 01/18/20   Fayette PhoLynn, Catherine, MD  Ferrous Fumarate (HEMOCYTE) 324 (106 Fe) MG TABS tablet Take 1 tablet (106 mg of iron total) by mouth 2 (two) times daily. Patient not taking: Reported on 05/12/2019 01/26/19 05/26/19  Currie ParisBurleson, Terri L, NP  fluconazole (DIFLUCAN) 150 MG tablet Take one tablet by mouth as a single dose. May repeat in 3 days if symptoms persist. 03/21/20   Mardella LaymanHagler, Brian, MD  guaiFENesin (MUCINEX) 600 MG 12 hr tablet Take 1 tablet (600 mg total) by mouth 2 (two) times daily for 7 days. 04/10/20 04/17/20  Daphnie Venturini, Veryl SpeakJacob E, PA-C  ibuprofen (ADVIL) 600 MG tablet TAKE 1 TABLET(600 MG) BY MOUTH EVERY 6 HOURS Patient taking differently: Take 600 mg by mouth every 6 (six) hours as needed for moderate pain.  01/24/20    Fayette PhoLynn, Catherine, MD  Menthol (CEPACOL SORE THROAT) 5.4 MG LOZG Use as directed 1 lozenge (5.4 mg total) in the mouth or throat every 2 (two) hours as needed. 04/10/20   Bonham Zingale, Veryl SpeakJacob E, PA-C  naproxen (NAPROSYN) 500 MG tablet Take 1 tablet (500 mg total) by mouth 2 (two) times daily. 02/05/20   Dartha LodgeFord, Kelsey N, PA-C  ondansetron (ZOFRAN) 4 MG tablet Take 1 tablet (4 mg total) by mouth every 8 (eight) hours as needed. 10/23/19   Caccavale, Sophia, PA-C  Prenatal Vit-Fe Fumarate-FA (PRENATAL MULTIVITAMIN) TABS tablet Take 1 tablet by mouth daily at 12 noon.    [provider]    Family History Family History  Problem Relation Age of Onset  . Lupus Mother   . Depression Mother   . Anxiety disorder Mother   . Migraines Mother   . Lupus Maternal Grandmother     Social History Social History   Tobacco Use  . Smoking status: Never Smoker  . Smokeless tobacco: Never Used  Vaping Use  . Vaping Use: Never used  Substance Use Topics  . Alcohol use: Not Currently  . Drug use: Not Currently    Types: Marijuana     Allergies   Fish allergy and Soap   Review of Systems Review of Systems   Physical Exam Triage Vital Signs ED Triage Vitals  Enc Vitals Group     BP 04/10/20 1806 114/67     Pulse Rate 04/10/20 1806 (!) 105     Resp 04/10/20 1806 16     Temp 04/10/20 1806 99 F (37.2 C)     Temp Source 04/10/20 1806 Oral     SpO2 04/10/20 1806 100 %     Weight --      Height --      Head Circumference --      Peak Flow --      Pain Score 04/10/20 1805 0     Pain Loc --      Pain Edu? --      Excl. in GC? --    No data found.  Updated Vital Signs BP 114/67 (BP Location: Right Arm)   Pulse (!) 105   Temp 99 F (37.2 C) (Oral)   Resp 16   LMP 03/26/2020   SpO2 100%   Breastfeeding Yes   Visual Acuity Right Eye Distance:   Left Eye Distance:   Bilateral Distance:    Right Eye Near:   Left Eye Near:    Bilateral Near:     Physical Exam Vitals and nursing  note  reviewed.  Constitutional:      General: She is not in acute distress.    Appearance: She is well-developed. She is ill-appearing.  HENT:     Head: Normocephalic and atraumatic.     Right Ear: Tympanic membrane normal.     Left Ear: Tympanic membrane normal.     Nose: Congestion present.     Mouth/Throat:     Pharynx: Uvula midline. Posterior oropharyngeal erythema present. No oropharyngeal exudate or uvula swelling.     Tonsils: 0 on the right. 0 on the left.  Eyes:     Conjunctiva/sclera: Conjunctivae normal.  Cardiovascular:     Rate and Rhythm: Normal rate and regular rhythm.     Heart sounds: No murmur heard.   Pulmonary:     Effort: Pulmonary effort is normal. No respiratory distress.     Breath sounds: Normal breath sounds. No stridor. No wheezing, rhonchi or rales.  Abdominal:     Palpations: Abdomen is soft.     Tenderness: There is no abdominal tenderness.  Musculoskeletal:     Cervical back: Neck supple.  Lymphadenopathy:     Cervical: No cervical adenopathy.  Skin:    General: Skin is warm and dry.  Neurological:     Mental Status: She is alert.      UC Treatments / Results  Labs (all labs ordered are listed, but only abnormal results are displayed) Labs Reviewed  CULTURE, GROUP A STREP ([facility])  SARS CORONAVIRUS 2 (TAT 6-24 HRS)  POCT RAPID STREP A, ED / UC    EKG   Radiology No results found.  Procedures Procedures (including critical care time)  Medications Ordered in UC Medications - No data to display  Initial Impression / Assessment and Plan / UC Course  I have reviewed the triage vital signs and the nursing notes.  Pertinent labs & imaging results that were available during my care of the patient were reviewed by me and considered in my medical decision making (see chart for details).     #Viral illness Patient is a 24 year old presenting with viral illness.  Rapid strep negative.  Culture sent.  Covid sent.  Slightly elevated  heart rate, otherwise normal vital signs.  We will treat her symptomatically.  Given she is not actively breast-feeding and giving minimal breastmilk as this is supplementary only, will treat with Tessalon, Mucinex Cepacol and Tylenol.  Discussed with patient that may be best to hold breast-feeding while utilizing Tessalon, there is no definitive data to suggest that this could be detrimental of the child however is reported up-to-date use with caution.  She is agreeable to this concept and idea.  I also stated she could touch base with pediatrician if she has further questions about the safety if she were to decide to breast-feed.  Discussed return, follow-up and emergency part precautions.  She verbalized agreement and understanding the plan of care   Addendum: Called patient 9/23 to inform of negative COVID and  have her hold tessalon, "pump and dump" prior breast feeding. She has not breast fed since starting as baby has been with father. While we had discussed holding breast feeding, felt it safest to have her tessalon altogether out of abundance of precaution.  Instructed to continue mucinex as cough had improved and she could consider dextromethorphan but she may stop medications if cough is minimal, see telephone note 9/23.  Final Clinical Impressions(s) / UC Diagnoses   Final diagnoses:  Viral illness     Discharge Instructions  Take the medications as prescribed -I recommend holding breast-feeding if you are transitioning this currently, you may also discuss with pediatrician prior to reinitiating any breast-feeding.  If symptoms are worsening with shortness of breath, high fevers or other concerning symptoms return or go to emergency department  If your Covid-19 test is positive, you will receive a phone call from [facility] regarding your results. Negative test results are not called. Both positive and negative results area always visible on MyChart. If you do not have a MyChart  account, sign up instructions are in your discharge papers.   Persons who are directed to care for themselves at home may discontinue isolation under the following conditions:  . At least 10 days have passed since symptom onset and . At least 24 hours have passed without running a fever (this means without the use of fever-reducing medications) and . Other symptoms have improved.  Persons infected with COVID-19 who never develop symptoms may discontinue isolation and other precautions 10 days after the date of their first positive COVID-19 test.       ED Prescriptions    Medication Sig Dispense Auth. Provider   acetaminophen (TYLENOL) 325 MG tablet Take 2 tablets (650 mg total) by mouth every 6 (six) hours as needed. 30 tablet Sherrol Vicars, Veryl Speak, PA-C   guaiFENesin (MUCINEX) 600 MG 12 hr tablet Take 1 tablet (600 mg total) by mouth 2 (two) times daily for 7 days. 14 tablet Seydou Hearns, Veryl Speak, PA-C   Menthol (CEPACOL SORE THROAT) 5.4 MG LOZG Use as directed 1 lozenge (5.4 mg total) in the mouth or throat every 2 (two) hours as needed. 30 lozenge Rosslyn Pasion, Veryl Speak, PA-C   benzonatate (TESSALON) 100 MG capsule Take 1 capsule (100 mg total) by mouth every 8 (eight) hours. 21 capsule Whalen Trompeter, Veryl Speak, PA-C     PDMP not reviewed this encounter.   Hermelinda Medicus, PA-C 04/11/20 0024    Cayman Brogden, Veryl Speak, PA-C 04/12/20 1057

## 2020-04-10 NOTE — ED Triage Notes (Signed)
Pt is here with a cough, sore throat and nasal congestion that started a week ago, pt has taken Halls cough drops, honey and tea, Benadryl to relieve discomfort.

## 2020-04-11 LAB — SARS CORONAVIRUS 2 (TAT 6-24 HRS): SARS Coronavirus 2: NEGATIVE

## 2020-04-12 ENCOUNTER — Telehealth (HOSPITAL_COMMUNITY): Payer: Self-pay | Admitting: Physician Assistant

## 2020-04-12 NOTE — Telephone Encounter (Cosign Needed)
Called to discuss patients Negative COVID results and check on status. Patient states Cough is improving, feels a little better overall. Discussed that given cough is improving and these is a chance she may resume breastfeeding this weekend as her child has been with the father through the week, to stop the Tessalon perles today, "pump and dump" prior to breastfeeding. Instructed that she may continue mucinex and consider over the counter dextromethorphan for her cough, but as it appears to be improved she may consider now medications. She verbalized agreement to this plan of care and understanding. Return precautions discussed.

## 2020-04-13 LAB — CULTURE, GROUP A STREP (THRC)

## 2020-05-01 ENCOUNTER — Other Ambulatory Visit: Payer: Self-pay | Admitting: *Deleted

## 2020-05-01 MED ORDER — BUPROPION HCL ER (XL) 150 MG PO TB24: 150.0000 mg | ORAL_TABLET | Freq: Every day | ORAL | 5 refills | Status: DC

## 2020-05-08 ENCOUNTER — Ambulatory Visit (INDEPENDENT_AMBULATORY_CARE_PROVIDER_SITE_OTHER): Payer: Medicaid Other | Admitting: Family Medicine

## 2020-05-08 ENCOUNTER — Encounter: Payer: Self-pay | Admitting: Family Medicine

## 2020-05-08 ENCOUNTER — Other Ambulatory Visit (HOSPITAL_COMMUNITY)
Admission: RE | Admit: 2020-05-08 | Discharge: 2020-05-08 | Disposition: A | Discharge status: Home / Self Care | Payer: Medicaid Other | Source: Ambulatory Visit | Attending: Family Medicine | Admitting: Family Medicine

## 2020-05-08 ENCOUNTER — Other Ambulatory Visit: Payer: Self-pay

## 2020-05-08 VITALS — BP 90/62 | HR 103 | Ht <= 58 in | Wt 99.0 lb

## 2020-05-08 DIAGNOSIS — F330 Major depressive disorder, recurrent, mild: Secondary | ICD-10-CM | POA: Diagnosis not present

## 2020-05-08 DIAGNOSIS — Z7251 High risk heterosexual behavior: Secondary | ICD-10-CM | POA: Diagnosis not present

## 2020-05-08 DIAGNOSIS — R634 Abnormal weight loss: Secondary | ICD-10-CM

## 2020-05-08 LAB — POCT WET PREP (WET MOUNT)
Clue Cells Wet Prep Whiff POC: NEGATIVE
Trichomonas Wet Prep HPF POC: ABSENT

## 2020-05-08 NOTE — Assessment & Plan Note (Signed)
Infant completely weaned at 12 mo, last month. Pt is still producing milk and wondering if there is something she can do or take to stop it. Advised limited hand expression only for comfort, strong peppermint like altoids, and occasional sudafed. Also referred to [email or web address] for home reading.

## 2020-05-08 NOTE — Progress Notes (Signed)
SUBJECTIVE:   CHIEF COMPLAINT / HPI:   Cessation of breast milk production Patient successfully weaned her 63-month-old from breast milk about a month ago (at 12 months).  Since then, she continues to express milk and is wondering if there is anything she can do to stop it.  While she cannot give me a volume measurement, she believes she is still producing just as much milk as when she was actively breast-feeding.  Her 40-month-old will spend half the week with her dad (FOB), and for those days patient does not pump.  However, when she is around her daughter, she sometimes has to manually express to relieve pressure and pain.  She has even tried eating cabbage, which someone told her may help stop milk production.  Unintentional weight loss Weighs 99 pounds in clinic today.  She is concerned about unintentional weight loss since the birth of her daughter September 2020.  She wonders if she could get a referral to a nutritionist. She is down from 150 pounds at the time she delivered.  She reports her prepregnancy weight as "120s, 130s".  She reports simply having no appetite along with smell/odor aversion to certain foods.  No nausea or vomiting.  She eats 1-2 small meals a day and tries to focus on protein.  She will try to include a protein, vegetable, and starch with each meal.  Sometimes however she only includes protein and starch.  Will snack throughout the day a little bit, eating peanut butter crackers, cheese crackers, trailmix, or chips.  Upon chart review, prepregnancy weight fluctuated between 109 and 130.  Given that her current weight is no more than a 10% weight loss from her prepregnancy weight and her BMI is still within normal limits, along with the fact that she does not have hypertension or diabetes, we do not believe her insurance would cover a visit to the nutritionist.  STD check Patient requests routine STD check today, to include gonorrhea, chlamydia, trichomonas, HIV,  and syphilis.  She has no specific concerns along with no signs or symptoms.  No rashes or lesions.  She simply wants to keep getting routine STD checks at this time.  Depression Feels like she is doing well right now and would like to consider weaning down her bupropion after the holidays "to see how the year plays out". Been on Welbutrin for years now, she feels stable on it.  She also sees a therapist weekly and feels she may manage her symptoms with that alone. No SI/HI.   PERTINENT  PMH / PSH: Depression, anxiety, unintentional weight loss, history of abnormal uterine bleeding  OBJECTIVE:   BP 90/62   Pulse (!) 103   Ht 4\' 10"  (1.473 m)   Wt 99 lb (44.9 kg)   LMP 04/23/2020   SpO2 100%   BMI 20.69 kg/m     PHQ-9:  Depression screen [facility] 2/9 05/08/2020 01/17/2020 12/07/2019  Decreased Interest 0 0 0  Down, Depressed, Hopeless 1 0 0  PHQ - 2 Score 1 0 0  Altered sleeping 1 - 1  Tired, decreased energy 1 - 1  Change in appetite 3 - 2  Feeling bad or failure about yourself  0 - 0  Trouble concentrating 0 - 0  Moving slowly or fidgety/restless 0 - 0  Suicidal thoughts 0 - 0  PHQ-9 Score 6 - 4  Difficult doing work/chores Somewhat difficult - Very difficult  Some recent data might be hidden     GAD-7:  GAD 7 : Generalized Anxiety Score 12/07/2019 09/16/2019 08/15/2019 08/15/2019  Nervous, Anxious, on Edge 0 1 1 1   Control/stop worrying 0 0 1 1  Worry too much - different things 0 0 2 2  Trouble relaxing 0 0 1 1  Restless 0 0 0 0  Easily annoyed or irritable 1 1 2 2   Afraid - awful might happen 0 0 0 0  Total GAD 7 Score 1 2 7 7   Anxiety Difficulty Somewhat difficult Somewhat difficult Very difficult Very difficult      Physical Exam General: Awake, alert, oriented Cardiovascular: Regular rate and rhythm, S1 and S2 present, no murmurs auscultated Respiratory: Lung fields clear to auscultation bilaterally Extremities: No bilateral lower extremity edema, palpable pedal and  pretibial pulses bilaterally Neuro: Cranial nerves II through X grossly intact, able to move all extremities spontaneously   ASSESSMENT/PLAN:   Breast feeding status of mother Infant completely weaned at 12 mo, last month. Pt is still producing milk and wondering if there is something she can do or take to stop it. Advised limited hand expression only for comfort, strong peppermint like altoids, and occasional sudafed. Also referred to [email or web address] for home reading.   Weight loss, unintentional 99 lbs today. Pt is distressed by weight loss and wants to gain weight back. Eating less due to lack of appetite ever since giving birth 03/2019 with odor aversion to some foods. Wants referral to nutrition. Given not more than 10% weight loss from pre-pregnancy weight, not diabetic, not hypertensive - her insurance won't cover. Advised to keep trying to increase caloric intake and food quality. Once she stops producing breast milk, she may gain more weight back too.   Unprotected sex Pt requested regular STD check of GC/Ch, trich, HIV, RPR. No concerns - no symptoms or lesions. Simply wants to continue with her regular checks.   Depression Feels like she is doing well right now and would like to consider weaning down her bupropion after the holidays. She also sees a therapist weekly and feels she may manage her symptoms with that alone. We discussed a slow taper over a period of 4 weeks or so that would require a slightly different prescription (Welbutrin XL daily to Welbutrin SR, taper and cut pills from there). Advised her to simply come back and discuss this when she is ready.      , MD [facility]

## 2020-05-08 NOTE — Assessment & Plan Note (Signed)
99 lbs today. Pt is distressed by weight loss and wants to gain weight back. Eating less due to lack of appetite ever since giving birth 03/2019 with odor aversion to some foods. Wants referral to nutrition. Given not more than 10% weight loss from pre-pregnancy weight, not diabetic, not hypertensive - her insurance won't cover. Advised to keep trying to increase caloric intake and food quality. Once she stops producing breast milk, she may gain more weight back too.

## 2020-05-08 NOTE — Assessment & Plan Note (Signed)
Pt requested regular STD check of GC/Ch, trich, HIV, RPR. No concerns - no symptoms or lesions. Simply wants to continue with her regular checks.

## 2020-05-08 NOTE — Patient Instructions (Signed)
It was wonderful to meet you today. Thank you for allowing me to be a part of your care. Below is a short summary of what we discussed at your visit today:  Weaning your breastmilk Remember to express as little as possible, and only for comfort.  Additionally, you can try strong peppermint candy and flavors such as altoids.  The over-the-counter medication Sudafed may also help, although there is some risk that it will raise blood pressure.  Given that your blood pressures in the normal range there should be no harmful effects from using this every once in a while.  Follow the directions on the medication box.  You may also visit [email or web address] for breast-feeding advice and other maternal topics.  Weight loss and decreased appetite We reviewed all of your document of weights in the chart.  While you have lost quite a bit of weight from the time that you gave birth in September 2020, it seems you are not too far under your pre-baby weight.  Your BMI is still within a normal range and all of your historical lab work this year has come back normal.  Given all of this, we recommend continue to focus on your nutrition like you are doing what you can.  We do not believe your insurance will cover a visit with a nutritionist at this time.  Routine STD check Today we did a vaginal swab for gonorrhea, chlamydia, and trichomonas.  This along with a blood test for both HIV and syphilis.  Your results will be sent to you via MyChart if normal.  If anything is out of the ordinary, I will give you call.   If you have any questions or concerns, please do not hesitate to contact us via phone or MyChart message.   Fayette Pho, MD

## 2020-05-08 NOTE — Assessment & Plan Note (Signed)
Feels like she is doing well right now and would like to consider weaning down her bupropion after the holidays. She also sees a therapist weekly and feels she may manage her symptoms with that alone. We discussed a slow taper over a period of 4 weeks or so that would require a slightly different prescription (Welbutrin XL daily to Welbutrin SR, taper and cut pills from there). Advised her to simply come back and discuss this when she is ready.

## 2020-05-09 LAB — HIV ANTIBODY (ROUTINE TESTING W REFLEX): HIV Screen 4th Generation wRfx: NONREACTIVE

## 2020-05-09 LAB — CERVICOVAGINAL ANCILLARY ONLY
Chlamydia: NEGATIVE
Comment: NEGATIVE
Comment: NEGATIVE
Comment: NORMAL
Neisseria Gonorrhea: NEGATIVE
Trichomonas: NEGATIVE

## 2020-05-09 LAB — RPR: RPR Ser Ql: NONREACTIVE

## 2020-06-27 DIAGNOSIS — Z113 Encounter for screening for infections with a predominantly sexual mode of transmission: Secondary | ICD-10-CM | POA: Diagnosis not present

## 2020-07-02 DIAGNOSIS — F341 Dysthymic disorder: Secondary | ICD-10-CM | POA: Diagnosis not present

## 2020-07-09 DIAGNOSIS — F341 Dysthymic disorder: Secondary | ICD-10-CM | POA: Diagnosis not present

## 2020-08-02 ENCOUNTER — Other Ambulatory Visit: Payer: Self-pay | Admitting: Obstetrics and Gynecology

## 2020-08-02 NOTE — Patient Outreach (Signed)
Care Coordination  08/02/2020  Lori Weaver 23-Nov-1995 [id number]    Medicaid Managed Care   Unsuccessful Outreach Note  08/02/2020 Name: Lori Weaver MRN: [id number] DOB: Dec 08, 1995  Referred by: Fayette Pho, MD Reason for referral : High Risk Managed Medicaid (Unsuccessful telephone outreach.)   An unsuccessful telephone outreach was attempted today. The patient was referred to the case management team for assistance with care management and care coordination.   Follow Up Plan: The Managed Medicaid  care management team will reach out to the patient again over the next 7 days.   Kathi Der RN, BSN [place]  [employer] - Managed Medicaid High Risk [phone].

## 2020-08-02 NOTE — Patient Instructions (Signed)
Hi Ms. Woollard, sorry we were unable to reach you today.  [name]  - as a part of your Medicaid benefit, you are eligible for care management and care coordination services at no cost or copay. I was unable to reach you by phone today but would be happy to help you with your health related needs. Please feel free to call me at [phone].  A member of the Managed Medicaid care management team will reach out to you again over the next 7 days.   Kathi Der RN, BSN   [employer] - Managed Medicaid High Risk [phone].

## 2020-08-15 DIAGNOSIS — F341 Dysthymic disorder: Secondary | ICD-10-CM | POA: Diagnosis not present

## 2020-08-22 ENCOUNTER — Other Ambulatory Visit: Payer: Self-pay

## 2020-08-22 DIAGNOSIS — F341 Dysthymic disorder: Secondary | ICD-10-CM | POA: Diagnosis not present

## 2020-08-22 MED ORDER — ADAPALENE-BENZOYL PEROXIDE 0.1-2.5 % EX GEL: 1.0000 "application " | Freq: Every day | CUTANEOUS | 2 refills | Status: DC

## 2020-08-26 NOTE — Patient Instructions (Incomplete)
It was wonderful to see you today. Thank you for allowing me to be a part of your care. Below is a short summary of what we discussed at your visit today:  Acne Breakouts ***  *** ***  Vaccines Today you received the annual flu vaccine, first Covid vaccine, and the Tdap vaccine***.  You may experience some residual soreness at the injection site.  Gentle stretches and regular use of that arm will help speed up your recovery.  As the vaccines are giving your immune system a "practice run" against specific infections, you may feel a little under the weather for the next several days.  We recommend rest as needed and hydrating.  Flu vaccine: Today you declined the annual flu vaccine.  If you change your mind at any time, you may simply call the clinic and make a vaccine only appointment with a nurse at your convenience.  We recommend getting your flu shot every year.  While it does not fully prevent you from getting the flu, it does reduce symptom severity and keep you out of the hospital.  Covid vaccine: You are fully vaccinated against Covid, so we do not have any further recommendations at this time.  Tdap vaccine: You are up-to-date on this Tdap vaccine.  Usually it is once every 10 years for maintenance.      If you have any questions or concerns, please do not hesitate to contact us via phone or MyChart message.   Fayette Pho, MD

## 2020-08-27 ENCOUNTER — Encounter: Payer: Self-pay | Admitting: *Deleted

## 2020-08-27 ENCOUNTER — Telehealth: Payer: Self-pay | Admitting: Family Medicine

## 2020-08-27 ENCOUNTER — Telehealth: Payer: Medicaid Other | Admitting: Family Medicine

## 2020-08-27 ENCOUNTER — Other Ambulatory Visit: Payer: Self-pay

## 2020-08-27 NOTE — Telephone Encounter (Signed)
Attempted to reach Ms. Raider regarding the change of her morning appointment from in-person to virtual. As she had not yet joined the video call, I called her primary contact number. No answer.   Fayette Pho, MD

## 2020-08-28 ENCOUNTER — Telehealth: Payer: Self-pay

## 2020-08-28 NOTE — Telephone Encounter (Signed)
Fax received from [employer]. Adapal/Benz 0.1-2.5% Gel is not covered. Pharmacy thinks only Epiduo Forte is covered. Please resend Rx.  Sunday Spillers, CMA

## 2020-08-29 ENCOUNTER — Other Ambulatory Visit: Payer: Self-pay | Admitting: Family Medicine

## 2020-08-29 DIAGNOSIS — L700 Acne vulgaris: Secondary | ICD-10-CM

## 2020-08-29 MED ORDER — ADAPALENE-BENZOYL PEROXIDE 0.3-2.5 % EX GEL: 1.0000 | Freq: Every day | CUTANEOUS | 3 refills | Status: DC

## 2020-08-29 NOTE — Progress Notes (Signed)
Just received fax from pharmacy stating that the Epiduo 0.1-2.5% gel was not covered by insurance, pharmacist thinks only Epiduo forte is covered.  Per publicly available online Medicaid formulary, both should be covered.  I will send in prescription for Epiduo forte in hopes that this will be covered by insurance.  Fayette Pho, MD

## 2020-09-03 ENCOUNTER — Ambulatory Visit: Payer: Medicaid Other | Admitting: Family Medicine

## 2020-09-03 NOTE — Progress Notes (Deleted)
    SUBJECTIVE:   CHIEF COMPLAINT / HPI:   ***  PERTINENT  PMH / PSH: ***  OBJECTIVE:   There were no vitals taken for this visit.  ***  ASSESSMENT/PLAN:   No problem-specific Assessment & Plan notes found for this encounter.     Jameis Newsham J Carlissa Pesola, DO [facility]  

## 2020-09-05 DIAGNOSIS — F341 Dysthymic disorder: Secondary | ICD-10-CM | POA: Diagnosis not present

## 2020-09-19 DIAGNOSIS — F341 Dysthymic disorder: Secondary | ICD-10-CM | POA: Diagnosis not present

## 2020-10-03 DIAGNOSIS — F341 Dysthymic disorder: Secondary | ICD-10-CM | POA: Diagnosis not present

## 2020-10-17 ENCOUNTER — Ambulatory Visit (HOSPITAL_COMMUNITY)
Admission: EM | Admit: 2020-10-17 | Discharge: 2020-10-17 | Disposition: A | Discharge status: Home / Self Care | Payer: Medicaid Other | Attending: Medical Oncology | Admitting: Medical Oncology

## 2020-10-17 ENCOUNTER — Encounter (HOSPITAL_COMMUNITY): Payer: Self-pay

## 2020-10-17 ENCOUNTER — Other Ambulatory Visit: Payer: Self-pay

## 2020-10-17 DIAGNOSIS — R300 Dysuria: Secondary | ICD-10-CM | POA: Insufficient documentation

## 2020-10-17 DIAGNOSIS — N898 Other specified noninflammatory disorders of vagina: Secondary | ICD-10-CM | POA: Diagnosis not present

## 2020-10-17 LAB — POCT URINALYSIS DIPSTICK, ED / UC
Bilirubin Urine: NEGATIVE
Glucose, UA: NEGATIVE mg/dL
Hgb urine dipstick: NEGATIVE
Ketones, ur: NEGATIVE mg/dL
Nitrite: NEGATIVE
Protein, ur: NEGATIVE mg/dL
Specific Gravity, Urine: 1.025 (ref 1.005–1.030)
Urobilinogen, UA: 0.2 mg/dL (ref 0.0–1.0)
pH: 7 (ref 5.0–8.0)

## 2020-10-17 MED ORDER — NITROFURANTOIN MONOHYD MACRO 100 MG PO CAPS
100.0000 mg | ORAL_CAPSULE | Freq: Two times a day (BID) | ORAL | 0 refills | Status: DC
Start: 2020-10-17 — End: 2022-02-02

## 2020-10-17 NOTE — ED Triage Notes (Signed)
Pt in with c/o vaginal itching and burning during urination that has been going on for about 3 days.  States she feels like she has to go often but she is only urinating small amounts  Pt also Requesting STD testing

## 2020-10-17 NOTE — ED Provider Notes (Addendum)
[facility]    CSN: [id number] Arrival date & time: 10/17/20  0815      History   Chief Complaint Chief Complaint  Patient presents with  . burning during urination  . STD testing  . Vaginal Itching    HPI Lori Weaver is a 25 y.o. female.   HPI   Dysuria: Pt states that 3 days ago she started to have symptoms similar in nature to past UTI symptoms. Symptoms include dysuria, urinary frequency. She does have some mild vaginal itching that occurred about 1 week prior but this has resolved. No new discharge but would like STI vaginal screening. She denies any fever, abdominal/pelvic pain, vomiting, risk of retained tampon. She has not taken anything for symptom. Of note, pt reports that she feels well today systemically despite her slightly low BP reading. LMC: Within 1 month  Past Medical History:  Diagnosis Date  . Allergy   . Anemia   . Anxiety   . Chlamydia   . Depression   . Dysmenorrhea   . Gonorrhea   . Possible pregnancy 11/03/2019  . Post-dates pregnancy 04/09/2019  . Postpartum care and examination 05/13/2019  . Supervision of other normal pregnancy, antepartum 09/15/2018    Nursing Staff Provider Office Location  [facility] Dating  LMP, consistent with early [place] Language  English Anatomy US  Normal Flu Vaccine  Declined-09/15/2018 Genetic Screen  NIPS: low risk, female  AFP:  Screen negative  TDaP vaccine    Hgb A1C or  GTT Early  Third trimester 1 hour 62 Rhogam  NA   LAB RESULTS  Feeding Plan Breast Blood Type B/Positive/-- (02/26 0000)  Contraception None Antibody Ne  . 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 Breast feeding status of mother 05/08/2020  . Unprotected sex 12/07/2019  . Weight loss, unintentional 09/16/2019  . Abnormal uterine bleeding 08/16/2019  . Anxiety 09/15/2018  . Irritant contact dermatitis 01/19/2018  . Dyspareunia in female 01/19/2018  . Family  history of lupus erythematosus 12/02/2017  . Depression 12/02/2017  . Acne vulgaris 12/02/2017    Past Surgical History:  Procedure Laterality Date  . LAPAROSCOPY N/A 05/06/2017   Procedure: LAPAROSCOPY DIAGNOSTIC;  Surgeon: Tereso Newcomer, MD;  Location: [facility];  Service: Gynecology;  Laterality: N/A;  . LAPAROSCOPY ABDOMEN DIAGNOSTIC    . TONSILLECTOMY AND ADENOIDECTOMY    . TYMPANOSTOMY TUBE PLACEMENT Bilateral   . WISDOM TOOTH EXTRACTION      OB History    Gravida  1   Para  1   Term  1   Preterm      AB      Living  1     SAB      IAB      Ectopic      Multiple  0   Live Births  1            Home Medications    Prior to Admission medications   Medication Sig Start Date End Date Taking? Authorizing Provider  acetaminophen (TYLENOL) 325 MG tablet Take 2 tablets (650 mg total) by mouth every 6 (six) hours as needed. 04/10/20   Darr, Gerilyn Pilgrim, PA-C  Adapalene-Benzoyl Peroxide (EPIDUO FORTE) 0.3-2.5 % GEL Apply 1 Pump topically at bedtime. 08/29/20   Fayette Pho, MD  buPROPion (WELLBUTRIN XL) 150 MG 24 hr tablet Take 1 tablet (150 mg total) by mouth  daily. 05/01/20   Fayette Pho, MD  ibuprofen (ADVIL) 600 MG tablet TAKE 1 TABLET(600 MG) BY MOUTH EVERY 6 HOURS Patient taking differently: Take 600 mg by mouth every 6 (six) hours as needed for moderate pain.  01/24/20   Fayette Pho, MD    Family History Family History  Problem Relation Age of Onset  . Lupus Mother   . Depression Mother   . Anxiety disorder Mother   . Migraines Mother   . Lupus Maternal Grandmother     Social History Social History   Tobacco Use  . Smoking status: Never Smoker  . Smokeless tobacco: Never Used  Vaping Use  . Vaping Use: Never used  Substance Use Topics  . Alcohol use: Not Currently  . Drug use: Not Currently    Types: Marijuana     Allergies   Fish allergy and Soap   Review of Systems Review of Systems  As stated above in  HPI Physical Exam Triage Vital Signs ED Triage Vitals  Enc Vitals Group     BP 10/17/20 0831 (!) 93/56     Pulse Rate 10/17/20 0827 60     Resp 10/17/20 0827 17     Temp 10/17/20 0827 97.7 F (36.5 C)     Temp src --      SpO2 10/17/20 0827 100 %     Weight --      Height --      Head Circumference --      Peak Flow --      Pain Score 10/17/20 0825 6     Pain Loc --      Pain Edu? --      Excl. in GC? --    No data found.  Updated Vital Signs BP (!) 93/56   Pulse 60   Temp 97.7 F (36.5 C)   Resp 17   LMP 10/02/2020 (Exact Date)   SpO2 100%   Breastfeeding No   Physical Exam Vitals and nursing note reviewed.  Constitutional:      General: She is not in acute distress.    Appearance: Normal appearance. She is not ill-appearing, toxic-appearing or diaphoretic.  HENT:     Head: Normocephalic and atraumatic.  Cardiovascular:     Rate and Rhythm: Normal rate and regular rhythm.     Heart sounds: Normal heart sounds.  Pulmonary:     Effort: Pulmonary effort is normal.     Breath sounds: Normal breath sounds.  Abdominal:     General: Bowel sounds are normal. There is no distension.     Palpations: Abdomen is soft. There is no mass.     Tenderness: There is no abdominal tenderness. There is no right CVA tenderness, left CVA tenderness, guarding or rebound.     Hernia: No hernia is present.  Lymphadenopathy:     Cervical: No cervical adenopathy.  Skin:    General: Skin is warm.     Coloration: Skin is not pale.  Neurological:     Mental Status: She is alert and oriented to person, place, and time.     Motor: No weakness.  Psychiatric:        Behavior: Behavior normal.      UC Treatments / Results  Labs (all labs ordered are listed, but only abnormal results are displayed) Labs Reviewed  POCT URINALYSIS DIPSTICK, ED / UC - Abnormal; Notable for the following components:      Result Value   Leukocytes,Ua TRACE (*)  All other components within normal  limits    EKG   Radiology No results found.  Procedures Procedures (including critical care time)  Medications Ordered in UC Medications - No data to display  Initial Impression / Assessment and Plan / UC Course  I have reviewed the triage vital signs and the nursing notes.  Pertinent labs & imaging results that were available during my care of the patient were reviewed by me and considered in my medical decision making (see chart for details).     New. Treating for UTI while we await culture as she reports symptoms feel identical to past UTI. Treating with macrobid. Discussed. She will stay hydrated with water and we reviewed red flag signs and symptoms. We also reviewed past BP values which were in similar range than today.    Final Clinical Impressions(s) / UC Diagnoses   Final diagnoses:  None   Discharge Instructions   None    ED Prescriptions    None     PDMP not reviewed this encounter.   Rushie Chestnut, PA-C 10/17/20 0908    Rushie Chestnut, PA-C 10/17/20 [phone]

## 2020-10-18 DIAGNOSIS — F341 Dysthymic disorder: Secondary | ICD-10-CM | POA: Diagnosis not present

## 2020-10-18 LAB — CERVICOVAGINAL ANCILLARY ONLY
Bacterial Vaginitis (gardnerella): NEGATIVE
Candida Glabrata: NEGATIVE
Candida Vaginitis: POSITIVE — AB
Chlamydia: NEGATIVE
Comment: NEGATIVE
Comment: NEGATIVE
Comment: NEGATIVE
Comment: NEGATIVE
Comment: NEGATIVE
Comment: NORMAL
Neisseria Gonorrhea: NEGATIVE
Trichomonas: NEGATIVE

## 2020-10-18 LAB — URINE CULTURE: Culture: NO GROWTH

## 2020-10-19 ENCOUNTER — Telehealth (HOSPITAL_COMMUNITY): Payer: Self-pay | Admitting: Emergency Medicine

## 2020-10-19 MED ORDER — FLUCONAZOLE 150 MG PO TABS
150.0000 mg | ORAL_TABLET | Freq: Once | ORAL | 0 refills | Status: AC
Start: 2020-10-19 — End: 2020-10-19

## 2020-10-29 ENCOUNTER — Ambulatory Visit (INDEPENDENT_AMBULATORY_CARE_PROVIDER_SITE_OTHER): Payer: Medicaid Other | Admitting: Family Medicine

## 2020-10-29 ENCOUNTER — Other Ambulatory Visit: Payer: Self-pay

## 2020-10-29 ENCOUNTER — Encounter: Payer: Self-pay | Admitting: Family Medicine

## 2020-10-29 VITALS — BP 92/58 | HR 90 | Ht <= 58 in | Wt 107.4 lb

## 2020-10-29 DIAGNOSIS — Z113 Encounter for screening for infections with a predominantly sexual mode of transmission: Secondary | ICD-10-CM | POA: Diagnosis not present

## 2020-10-29 DIAGNOSIS — N898 Other specified noninflammatory disorders of vagina: Secondary | ICD-10-CM | POA: Diagnosis not present

## 2020-10-29 DIAGNOSIS — Z0000 Encounter for general adult medical examination without abnormal findings: Secondary | ICD-10-CM | POA: Diagnosis not present

## 2020-10-29 DIAGNOSIS — Z840 Family history of diseases of the skin and subcutaneous tissue: Secondary | ICD-10-CM

## 2020-10-29 DIAGNOSIS — Z1159 Encounter for screening for other viral diseases: Secondary | ICD-10-CM

## 2020-10-29 LAB — POCT WET PREP (WET MOUNT)
Clue Cells Wet Prep Whiff POC: NEGATIVE
Trichomonas Wet Prep HPF POC: ABSENT

## 2020-10-29 NOTE — Assessment & Plan Note (Addendum)
Recently treated for yeast infection with fluconazole x 2 doses.  She is still reporting persistent symptoms.  Wet prep obtained which was negative.  GC chlamydia pending.

## 2020-10-29 NOTE — Patient Instructions (Addendum)
Thank you for coming to see me today. It was a pleasure. Today we discussed your vaginal discharge. It could be in STD, yeast infection or BV. We did a wet prep.  We performed STD testing today. This will take a few days to come back. If your MyChart is activated, we will message you on there if everything is normal, otherwise we will call. If we need to treat something we will also call you. If you do not hear from Lori Weaver in the next 4 days, please give Lori Weaver a call.  Please follow-up with your PCP as and when needed.  If you have any questions or concerns, please do not hesitate to call the office at [phone].  Best wishes,   Dr Allena Katz

## 2020-10-29 NOTE — Progress Notes (Signed)
SUBJECTIVE:   Chief compliant/HPI: annual examination  Lori Weaver is a 25 y.o. who presents today for an annual exam.   Vaginal Discharge Patient reports that vaginal discharge started 2 weeks ago. She think its because she is ovulating. She notes that discharge appears yellow/white.  She denies vaginal odors. Has mild vaginal pruritis and dysuria. Recently tested neg for UTI in urgent care.  Denies abnormal vaginal bleeding,hematuria, frequency, abdominal pain pelvic pain, nausea, vomiting, fevers or new rash.   She has used fluconazole X 2 with some relief.   Does history of STIs.  She is not currently sexually active.  Contraception: none.  LMP march 15th.  She does not douche.  Review of systems: neg  Updated history tabs and problem list.  OBJECTIVE:   BP (!) 92/58   Pulse 90   Ht _0  (1.473 m)   Wt 107 lb 6.4 oz (48.7 kg)   LMP 10/02/2020 (Exact Date)   SpO2 100%   BMI 22.45 kg/m    General: Alert, no acute distress Cardio: Normal S1 and S2, RRR, no r/m/g Pulm: CTAB, normal work of breathing Abdomen: Bowel sounds normal. Abdomen soft and non-tender.  Extremities: No peripheral edema.  Neuro: Cranial nerves grossly intact   Pelvic Exam chaperoned by CMA Lori Weaver         External: normal female genitalia without lesions or masses        Vagina: normal without lesions or masses        Cervix: normal without lesions or masses        Pap smear: performed        Samples for Wet prep, GC/Chlamydia obtained  ASSESSMENT/PLAN:   Family history of lupus erythematosus Pt is currently asymptomatic and does not have any signs or symptoms for SLE.  However she does have a strong family history of lupus-both her mom and grandma both had lupus and passed away from complications of lupus.  Her mom developed lupus at the age of 51.  I recommended that there is no routine screening that we do for lupus however I can obtain CBC, BMP ESR and CRP to ensure these are normal and that  there is no inflammation in her body.  Also recommended low threshold to seek medical advice if she has unexplained fevers, weight loss, arthralgias, fatigue, rashes etc. Patient expressed understanding and is happy with the plan.  Vaginal pruritus Recently treated for yeast infection with fluconazole x 2 doses.  She is still reporting persistent symptoms.  Wet prep obtained which was negative.  GC chlamydia pending.    Annual Examination  See AVS for age appropriate recommendations.   Lori Weaver Office Visit from 10/29/2020 in [place]  PHQ-9 Total Score 2     Blood pressure reviewed and at goal. Asked about intimate partner violence and patient reports none. The patient currently uses nothing for contraception. Counseled on birth control    Considered the following items based upon USPSTF recommendations: HIV testing: discussed Hepatitis C: ordered Hepatitis B: discussed Syphilis if at high risk: ordered GC/CT at high risk, ordered.  Lipid panel (nonfasting or fasting) discussed based upon AHA recommendations and not ordered.  Consider repeat every 4-6 years.  Reviewed risk factors for latent tuberculosis and not indicated  Discussed family history, BRCA testing not indicated.  Cervical cancer screening: prior Pap reviewed, repeat due in 09/15/21 Immunizations- declined COVID and tetanus. Discussed importance and benefits of these immunizations.  Follow  up in 1 year or sooner if indicated.    Lori Haw, MD [place]

## 2020-10-29 NOTE — Assessment & Plan Note (Addendum)
Pt is currently asymptomatic and does not have any signs or symptoms for SLE.  However she does have a strong family history of lupus-both her mom and grandma both had lupus and passed away from complications of lupus.  Her mom developed lupus at the age of 80.  I recommended that there is no routine screening that we do for lupus however I can obtain CBC, BMP ESR and CRP to ensure these are normal and that there is no inflammation in her body.  Also recommended low threshold to seek medical advice if she has unexplained fevers, weight loss, arthralgias, fatigue, rashes etc. Patient expressed understanding and is happy with the plan.

## 2020-10-30 ENCOUNTER — Encounter: Payer: Self-pay | Admitting: Family Medicine

## 2020-10-30 LAB — BASIC METABOLIC PANEL
BUN/Creatinine Ratio: 9 (ref 9–23)
BUN: 8 mg/dL (ref 6–20)
CO2: 22 mmol/L (ref 20–29)
Calcium: 9.9 mg/dL (ref 8.7–10.2)
Chloride: 104 mmol/L (ref 96–106)
Creatinine, Ser: 0.85 mg/dL (ref 0.57–1.00)
Glucose: 74 mg/dL (ref 65–99)
Potassium: 4.3 mmol/L (ref 3.5–5.2)
Sodium: 141 mmol/L (ref 134–144)
eGFR: 99 mL/min/{1.73_m2} (ref >59)

## 2020-10-30 LAB — SEDIMENTATION RATE: Sed Rate: 4 mm/hr (ref 0–32)

## 2020-10-30 LAB — CBC
Hematocrit: 35.2 % (ref 34.0–46.6)
Hemoglobin: 11.9 g/dL (ref 11.1–15.9)
MCH: 29.6 pg (ref 26.6–33.0)
MCHC: 33.8 g/dL (ref 31.5–35.7)
MCV: 88 fL (ref 79–97)
Platelets: 366 10*3/uL (ref 150–450)
RBC: 4.02 x10E6/uL (ref 3.77–5.28)
RDW: 11.3 % — ABNORMAL LOW (ref 11.7–15.4)
WBC: 5.4 10*3/uL (ref 3.4–10.8)

## 2020-10-30 LAB — HEPATITIS C ANTIBODY: Hep C Virus Ab: <0.1 s/co ratio (ref 0.0–0.9)

## 2020-10-30 LAB — C-REACTIVE PROTEIN: CRP: <1 mg/L (ref 0–10)

## 2020-10-30 LAB — RPR: RPR Ser Ql: NONREACTIVE

## 2020-11-08 DIAGNOSIS — F341 Dysthymic disorder: Secondary | ICD-10-CM | POA: Diagnosis not present

## 2020-11-28 DIAGNOSIS — F341 Dysthymic disorder: Secondary | ICD-10-CM | POA: Diagnosis not present

## 2020-12-06 DIAGNOSIS — F341 Dysthymic disorder: Secondary | ICD-10-CM | POA: Diagnosis not present

## 2020-12-12 DIAGNOSIS — F341 Dysthymic disorder: Secondary | ICD-10-CM | POA: Diagnosis not present

## 2020-12-26 DIAGNOSIS — F341 Dysthymic disorder: Secondary | ICD-10-CM | POA: Diagnosis not present

## 2020-12-29 DIAGNOSIS — L209 Atopic dermatitis, unspecified: Secondary | ICD-10-CM | POA: Diagnosis not present

## 2021-02-06 ENCOUNTER — Ambulatory Visit (INDEPENDENT_AMBULATORY_CARE_PROVIDER_SITE_OTHER): Payer: Medicaid Other | Admitting: Family Medicine

## 2021-02-06 ENCOUNTER — Other Ambulatory Visit (HOSPITAL_COMMUNITY)
Admission: RE | Admit: 2021-02-06 | Discharge: 2021-02-06 | Disposition: A | Discharge status: Home / Self Care | Payer: Medicaid Other | Source: Ambulatory Visit | Attending: Family Medicine | Admitting: Family Medicine

## 2021-02-06 ENCOUNTER — Encounter: Payer: Self-pay | Admitting: Family Medicine

## 2021-02-06 ENCOUNTER — Other Ambulatory Visit: Payer: Self-pay

## 2021-02-06 VITALS — BP 99/87 | HR 97 | Ht <= 58 in | Wt 102.4 lb

## 2021-02-06 DIAGNOSIS — Z20822 Contact with and (suspected) exposure to covid-19: Secondary | ICD-10-CM | POA: Diagnosis not present

## 2021-02-06 DIAGNOSIS — N898 Other specified noninflammatory disorders of vagina: Secondary | ICD-10-CM

## 2021-02-06 DIAGNOSIS — Z113 Encounter for screening for infections with a predominantly sexual mode of transmission: Secondary | ICD-10-CM | POA: Insufficient documentation

## 2021-02-06 DIAGNOSIS — R0981 Nasal congestion: Secondary | ICD-10-CM | POA: Diagnosis not present

## 2021-02-06 DIAGNOSIS — J069 Acute upper respiratory infection, unspecified: Secondary | ICD-10-CM | POA: Insufficient documentation

## 2021-02-06 MED ORDER — BENZONATATE 100 MG PO CAPS: 100.0000 mg | ORAL_CAPSULE | Freq: Two times a day (BID) | ORAL | 0 refills | Status: DC | PRN

## 2021-02-06 MED ORDER — FLUTICASONE PROPIONATE 50 MCG/ACT NA SUSP: 2.0000 | Freq: Every day | NASAL | 6 refills | Status: DC

## 2021-02-06 NOTE — Assessment & Plan Note (Signed)
Patient has history of yeast infections.  Obtained Optima swab today with GC, trichomonas, BV, vaginal candidiasis-results are pending.  Placed future orders for HIV and RPR.  We will have patient return for labs when she is cleared by COVID.

## 2021-02-06 NOTE — Progress Notes (Signed)
    SUBJECTIVE:   CHIEF COMPLAINT / HPI:   Vaginal Discharge: Patient is a 25 y.o. female presenting with vaginal itching for 2days. She endorses no discharge or vaginal odor.  She is interested in screening for sexually transmitted infections today. Unfortunately since she has COVID symptoms, I can only obtain Aptima swab. Will have her return for RPR/HIV when cleared from COVID test/quarantine.  Last pap smear NILM 08/2018, next due in 08/2021.  URI symptoms: patient reports that she has 4 days of body aches, fatigue, runny nose, congestion, cough, head ache. Denies fever, chills, SOB, CP, n/v/d. She is having intense cough and stuffy nose despite supportive care. Took COVID test at home on day 2 of symptoms, which could have been falsely positive due to timing. Will repeat COVID PCR today. Patient is young, healthy, at low risk for progression to severe disease.   PERTINENT  PMH / PSH: None relevant  OBJECTIVE:   BP 99/87   Pulse 97   Ht 4\' 10"  (1.473 m)   Wt 102 lb 6.4 oz (46.4 kg)   LMP 01/19/2021   SpO2 99%   BMI 21.40 kg/m   Nursing note and vitals reviewed GEN: Young African-American woman, resting comfortably in chair, NAD, WNWD, ill-appearing, alert and at baseline HEENT: NCAT. PERRLA. Sclera without injection or icterus. MMM.  Nasal passages edematous and erythematous with clear thin secretions, TMs nonbulging, nonerythematous bilaterally.  Clear oropharynx. Neck: Supple.  No LAD. Cardiac: Regular rate and rhythm. Normal S1/S2. No murmurs, rubs, or gallops appreciated. 2+ radial pulses. Lungs: Clear bilaterally to ascultation. No increased WOB, no accessory muscle usage. No w/r/r. Ext: no edema Psych: Pleasant and appropriate   ASSESSMENT/PLAN:   Vaginal pruritus Patient has history of yeast infections.  Obtained Optima swab today with GC, trichomonas, BV, vaginal candidiasis-results are pending.  Placed future orders for HIV and RPR.  We will have patient return for  labs when she is cleared by COVID.  Viral URI with cough Patient with symptoms consistent with viral upper respiratory infection.  Discussed supportive care and return precautions.  Recommend staying hydrated, saline nasal rinse, fluticasone, benzonatate for symptoms.  Follow-up if symptoms worsen or fail to improve the next 7 days.  Counseled on masking and quarantining until COVID results return. -Coronavirus PCR pending    03/22/2021, MD [facility]

## 2021-02-06 NOTE — Assessment & Plan Note (Signed)
Patient with symptoms consistent with viral upper respiratory infection.  Discussed supportive care and return precautions.  Recommend staying hydrated, saline nasal rinse, fluticasone, benzonatate for symptoms.  Follow-up if symptoms worsen or fail to improve the next 7 days.  Counseled on masking and quarantining until COVID results return. -Coronavirus PCR pending

## 2021-02-06 NOTE — Patient Instructions (Addendum)
It was a pleasure to see you today!  For your upper respiratory infection: viruses usually last 7 days. I recommend using flonase daily (one spray each nostril) to help with congestion. You can use tessalon perles for cough, twice a day as needed. I also recommend using nasal saline wash to help decrease nasal mucus. Only use boiled tap water or distilled water to do the nasal rinse with. If you have shortness of breath or chest pain, follow up or be evaluated by a medical provider. Drink well to stay hydrated. We will get some labs today.  If they are abnormal or we need to do something about them, I will call you.  If they are normal, I will send you a message on MyChart (if it is active) or a letter in the mail.  If you don't hear from [place] in 2 weeks, please call the office  [phone]. I will order future labs for the ones we couldn't do today. When you are out of quarantine or cleared for COVID, call the office and schedule a lab only visit at your convenience.    Be Well,  Dr. Leary Roca

## 2021-02-07 LAB — CERVICOVAGINAL ANCILLARY ONLY
Bacterial Vaginitis (gardnerella): NEGATIVE
Candida Glabrata: NEGATIVE
Candida Vaginitis: NEGATIVE
Chlamydia: NEGATIVE
Comment: NEGATIVE
Comment: NEGATIVE
Comment: NEGATIVE
Comment: NEGATIVE
Comment: NEGATIVE
Comment: NORMAL
Neisseria Gonorrhea: NEGATIVE
Trichomonas: NEGATIVE

## 2021-02-07 LAB — SARS-COV-2, NAA 2 DAY TAT

## 2021-02-07 LAB — NOVEL CORONAVIRUS, NAA: SARS-CoV-2, NAA: NOT DETECTED

## 2021-03-30 ENCOUNTER — Ambulatory Visit (HOSPITAL_COMMUNITY)
Admission: EM | Admit: 2021-03-30 | Discharge: 2021-03-30 | Disposition: A | Discharge status: Home / Self Care | Payer: Medicaid Other | Attending: Emergency Medicine | Admitting: Emergency Medicine

## 2021-03-30 ENCOUNTER — Encounter (HOSPITAL_COMMUNITY): Payer: Self-pay

## 2021-03-30 ENCOUNTER — Other Ambulatory Visit: Payer: Self-pay

## 2021-03-30 DIAGNOSIS — Z20822 Contact with and (suspected) exposure to covid-19: Secondary | ICD-10-CM | POA: Insufficient documentation

## 2021-03-30 DIAGNOSIS — J069 Acute upper respiratory infection, unspecified: Secondary | ICD-10-CM

## 2021-03-30 LAB — POC INFLUENZA A AND B ANTIGEN (URGENT CARE ONLY)
INFLUENZA A ANTIGEN, POC: NEGATIVE
INFLUENZA B ANTIGEN, POC: NEGATIVE

## 2021-03-30 LAB — SARS CORONAVIRUS 2 (TAT 6-24 HRS): SARS Coronavirus 2: NEGATIVE

## 2021-03-30 MED ORDER — IBUPROFEN 600 MG PO TABS: 600.0000 mg | ORAL_TABLET | Freq: Once | ORAL | Status: DC

## 2021-03-30 MED ORDER — IBUPROFEN 800 MG PO TABS
800.0000 mg | ORAL_TABLET | Freq: Once | ORAL | Status: AC
  Administered 2021-03-30: 800 mg via ORAL

## 2021-03-30 MED ORDER — IBUPROFEN 800 MG PO TABS
ORAL_TABLET | ORAL | Status: AC
  Filled 2021-03-30: qty 1

## 2021-03-30 MED ORDER — IBUPROFEN 600 MG PO TABS: 600.0000 mg | ORAL_TABLET | Freq: Four times a day (QID) | ORAL | 0 refills | Status: DC | PRN

## 2021-03-30 NOTE — Discharge Instructions (Addendum)
Your illness is most likely due to a viral infection.  You did not test positive for flu.  You will be notified of the results of your COVID-19 test.  Please continue to isolate at home until you receive those results.  Please have a friend or family member go to the pharmacy to pick up your prescription for ibuprofen for your muscle aches and pains which you are welcome to take as needed 3 times daily.  You can also continue acetaminophen for fever.  Continue to push clear fluids, rest until you are feeling better.

## 2021-03-30 NOTE — ED Triage Notes (Signed)
Pt presents with c/o chills, headache, body aches and sore throat X 3 days.   States she has taken Tylenol that has not given her relief. States she tried taking NyQuil. Pt states it did not help her.

## 2021-03-30 NOTE — ED Provider Notes (Addendum)
[facility]    CSN: [id number] Arrival date & time: 03/30/21  1019      History   Chief Complaint Chief Complaint  Patient presents with   Sore Throat    HPI Lori Weaver is a 25 y.o. female.   Patient reports 3-day history of sore throat, body aches, alternating fever and chills and myalgia.  Patient states her daughter has been sick but not been tested for COVID-19.  Patient states she is not vaccinated for COVID-19.  Patient states she has been taking Tylenol and NyQuil with no significant relief.  She states she is also been sleeping a lot.  The history is provided by the patient.  Fever Onset quality:  Sudden Duration:  3 days Progression:  Unchanged Relieved by:  Nothing Worsened by:  Nothing Ineffective treatments:  Acetaminophen Associated symptoms: chills, congestion, cough, ear pain, headaches, myalgias, rhinorrhea, somnolence and sore throat   Associated symptoms: no chest pain, no confusion, no diarrhea, no dysuria, no nausea, no rash and no vomiting   Risk factors: recent sickness   Sore Throat This is a new problem. The current episode started more than 2 days ago. The problem occurs constantly. The problem has not changed since onset.Associated symptoms include headaches. Pertinent negatives include no chest pain, no abdominal pain and no shortness of breath. Nothing aggravates the symptoms. Nothing relieves the symptoms. She has tried acetaminophen for the symptoms. The treatment provided no relief.   Past Medical History:  Diagnosis Date   Allergy    Anemia    Anxiety    Chlamydia    Depression    Dysmenorrhea    Gonorrhea    Possible pregnancy 11/03/2019   Post-dates pregnancy 04/09/2019   Postpartum care and examination 05/13/2019   Supervision of other normal pregnancy, antepartum 09/15/2018    Nursing Staff Provider Office Location  [facility] Dating  LMP, consistent with early US Language  English Anatomy US  Normal Flu Vaccine   Declined-09/15/2018 Genetic Screen  NIPS: low risk, female  AFP:  Screen negative  TDaP vaccine    Hgb A1C or  GTT Early  Third trimester 1 hour 62 Rhogam  NA   LAB RESULTS  Feeding Plan Breast Blood Type B/Positive/-- (02/26 0000)  Contraception None Antibody Ne   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    Patient Active Problem List   Diagnosis Date Noted   Viral URI with cough 02/06/2021   Vaginal pruritus 10/29/2020   Breast feeding status of mother 05/08/2020   Weight loss, unintentional 09/16/2019   Abnormal uterine bleeding 08/16/2019   Anxiety 09/15/2018   Irritant contact dermatitis 01/19/2018   Dyspareunia in female 01/19/2018   Family history of lupus erythematosus 12/02/2017   Depression 12/02/2017   Acne vulgaris 12/02/2017    Past Surgical History:  Procedure Laterality Date   LAPAROSCOPY N/A 05/06/2017   Procedure: LAPAROSCOPY DIAGNOSTIC;  Surgeon: Tereso NewcomerAnyanwu, Ugonna A, MD;  Location: [facility];  Service: Gynecology;  Laterality: N/A;   LAPAROSCOPY ABDOMEN DIAGNOSTIC     TONSILLECTOMY AND ADENOIDECTOMY     TYMPANOSTOMY TUBE PLACEMENT Bilateral    WISDOM TOOTH EXTRACTION      OB History     Gravida  1   Para  1   Term  1   Preterm      AB      Living  1      SAB      IAB  Ectopic      Multiple  0   Live Births  1            Home Medications    Prior to Admission medications   Medication Sig Start Date End Date Taking? Authorizing Provider  ibuprofen (ADVIL) 600 MG tablet Take 1 tablet (600 mg total) by mouth every 6 (six) hours as needed. 03/30/21  Yes Theadora Rama Scales, PA-C  acetaminophen (TYLENOL) 325 MG tablet Take 2 tablets (650 mg total) by mouth every 6 (six) hours as needed. 04/10/20   Darr, Gerilyn Pilgrim, PA-C  Adapalene-Benzoyl Peroxide (EPIDUO FORTE) 0.3-2.5 % GEL Apply 1 Pump topically at bedtime. 08/29/20   Fayette Pho, MD  benzonatate (TESSALON) 100 MG capsule Take 1 capsule (100 mg  total) by mouth 2 (two) times daily as needed for cough. 02/06/21   Shirlean Mylar, MD  fluticasone [facility]) 50 MCG/ACT nasal spray Place 2 sprays into both nostrils daily. 02/06/21   Shirlean Mylar, MD  nitrofurantoin, macrocrystal-monohydrate, (MACROBID) 100 MG capsule Take 1 capsule (100 mg total) by mouth 2 (two) times daily. 10/17/20   Rushie Chestnut, PA-C  buPROPion (WELLBUTRIN XL) 150 MG 24 hr tablet Take 1 tablet (150 mg total) by mouth daily. 05/01/20 10/17/20  Fayette Pho, MD    Family History Family History  Problem Relation Age of Onset   Lupus Mother    Depression Mother    Anxiety disorder Mother    Migraines Mother    Lupus Maternal Grandmother     Social History Social History   Tobacco Use   Smoking status: Never   Smokeless tobacco: Never  Vaping Use   Vaping Use: Never used  Substance Use Topics   Alcohol use: Not Currently   Drug use: Not Currently    Types: Marijuana     Allergies   Fish allergy and Soap   Review of Systems Review of Systems  Constitutional:  Positive for chills and fever.  HENT:  Positive for congestion, ear pain, rhinorrhea and sore throat.   Respiratory:  Positive for cough. Negative for shortness of breath.   Cardiovascular:  Negative for chest pain.  Gastrointestinal:  Negative for abdominal pain, diarrhea, nausea and vomiting.  Genitourinary:  Negative for dysuria.  Musculoskeletal:  Positive for myalgias.  Skin:  Negative for rash.  Neurological:  Positive for headaches.  Psychiatric/Behavioral:  Negative for confusion.   All other systems reviewed and are negative.   Physical Exam Triage Vital Signs ED Triage Vitals  Enc Vitals Group     BP      Pulse      Resp      Temp      Temp src      SpO2      Weight      Height      Head Circumference      Peak Flow      Pain Score      Pain Loc      Pain Edu?      Excl. in GC?    No data found.  Updated Vital Signs Pulse 71   Temp 99.6 F (37.6 C)  (Oral)   Resp 17   LMP 03/18/2021 (Exact Date)   SpO2 100%   Visual Acuity Right Eye Distance:   Left Eye Distance:   Bilateral Distance:    Right Eye Near:   Left Eye Near:    Bilateral Near:     Physical Exam Vitals and nursing note  reviewed.  Constitutional:      Appearance: She is well-developed and normal weight. She is ill-appearing.  HENT:     Head: Normocephalic and atraumatic.     Right Ear: Tympanic membrane and ear canal normal. No middle ear effusion. Tympanic membrane is not erythematous.     Left Ear: Tympanic membrane and ear canal normal.  No middle ear effusion. Tympanic membrane is not erythematous.     Nose: Rhinorrhea present. No congestion.     Mouth/Throat:     Mouth: Mucous membranes are moist. No oral lesions.     Pharynx: Uvula midline. Posterior oropharyngeal erythema present. No pharyngeal swelling, oropharyngeal exudate or uvula swelling.     Tonsils: No tonsillar exudate or tonsillar abscesses. 0 on the right. 0 on the left.  Eyes:     Conjunctiva/sclera: Conjunctivae normal.     Pupils: Pupils are equal, round, and reactive to light.  Neck:     Thyroid: No thyromegaly.  Cardiovascular:     Rate and Rhythm: Normal rate and regular rhythm.     Heart sounds: Normal heart sounds.  Pulmonary:     Effort: Pulmonary effort is normal.     Breath sounds: Normal breath sounds.  Abdominal:     General: Bowel sounds are normal.     Palpations: Abdomen is soft.  Musculoskeletal:     Cervical back: Normal range of motion and neck supple.  Lymphadenopathy:     Cervical: Cervical adenopathy present.  Skin:    General: Skin is warm and dry.  Neurological:     General: No focal deficit present.     Mental Status: She is alert and oriented to person, place, and time.  Psychiatric:        Mood and Affect: Mood normal.        Behavior: Behavior normal.     UC Treatments / Results  Labs (all labs ordered are listed, but only abnormal results are  displayed) Labs Reviewed  SARS CORONAVIRUS 2 (TAT 6-24 HRS)  POC INFLUENZA A AND B ANTIGEN (URGENT CARE ONLY)    EKG   Radiology No results found.  Procedures Procedures (including critical care time)  Medications Ordered in UC Medications  ibuprofen (ADVIL) tablet 800 mg (800 mg Oral Given 03/30/21 1315)    Initial Impression / Assessment and Plan / UC Course  I have reviewed the triage vital signs and the nursing notes.  Pertinent labs & imaging results that were available during my care of the patient were reviewed by me and considered in my medical decision making (see chart for details).     Influenza test today was negative.  COVID-19 test is pending.  Patient's illness is most likely viral in etiology.  I discussed with patient importance of vaccination for COVID-19 and her and her daughter.  Patient was given ibuprofen in office and prescription sent to pharmacy.  Patient given strict return precautions for worsening fever, altered mental status, acute shortness of breath.  Patient verbalized understanding and agreed with plan. Final Clinical Impressions(s) / UC Diagnoses   Final diagnoses:  Viral upper respiratory tract infection     Discharge Instructions      Your illness is most likely due to a viral infection.  You did not test positive for flu.  You will be notified of the results of your COVID-19 test.  Please continue to isolate at home until you receive those results.  Please have a friend or family member go to the pharmacy  to pick up your prescription for ibuprofen for your muscle aches and pains which you are welcome to take as needed 3 times daily.  You can also continue acetaminophen for fever.  Continue to push clear fluids, rest until you are feeling better.     ED Prescriptions     Medication Sig Dispense Auth. Provider   ibuprofen (ADVIL) 600 MG tablet Take 1 tablet (600 mg total) by mouth every 6 (six) hours as needed. 30 tablet Theadora Rama  Scales, PA-C      PDMP not reviewed this encounter.   Theadora Rama Scales, PA-C 03/30/21 1304    Theadora Rama Scales, PA-C 03/30/21 1307    Theadora Rama Scales, PA-C 03/30/21 1328    Theadora Rama [place], [place] 03/30/21 1330

## 2021-05-27 NOTE — Progress Notes (Deleted)
    SUBJECTIVE:   CHIEF COMPLAINT / HPI:   STD check-   PERTINENT  PMH / PSH: ***  OBJECTIVE:   There were no vitals taken for this visit.  ***  ASSESSMENT/PLAN:   No problem-specific Assessment & Plan notes found for this encounter.     Billey Co, MD [facility]

## 2021-05-29 ENCOUNTER — Ambulatory Visit: Payer: Medicaid Other | Admitting: Family Medicine

## 2021-06-26 ENCOUNTER — Other Ambulatory Visit: Payer: Self-pay

## 2021-06-26 ENCOUNTER — Encounter: Payer: Self-pay | Admitting: Student

## 2021-06-26 ENCOUNTER — Ambulatory Visit (INDEPENDENT_AMBULATORY_CARE_PROVIDER_SITE_OTHER): Payer: Medicaid Other | Admitting: Student

## 2021-06-26 ENCOUNTER — Other Ambulatory Visit (HOSPITAL_COMMUNITY)
Admission: RE | Admit: 2021-06-26 | Discharge: 2021-06-26 | Disposition: A | Discharge status: Home / Self Care | Payer: Medicaid Other | Source: Ambulatory Visit | Attending: Family Medicine | Admitting: Family Medicine

## 2021-06-26 DIAGNOSIS — Z113 Encounter for screening for infections with a predominantly sexual mode of transmission: Secondary | ICD-10-CM | POA: Insufficient documentation

## 2021-06-26 NOTE — Progress Notes (Signed)
    SUBJECTIVE:   CHIEF COMPLAINT / HPI:   [name] is a 25 year old female who presented today for routine STD testing.  She reports protected sex with a recent change in sexual partner few weeks ago. She has regular period that last 4-5days. LMP was 06/10/21. Denies any vaginal discharge, irritation, lesion, dysuria or hematuria. Past history of chlamydia 3 years ago.  PERTINENT  PMH / PSH: Depression, Anxiety   OBJECTIVE:   BP (!) 93/54   Pulse 70   Ht 4\' 10"  (1.473 m)   Wt 100 lb 3.2 oz (45.5 kg)   LMP 06/10/2021 (Exact Date)   SpO2 100%   BMI 20.94 kg/m    Physical Exam General: Alert, well appearing, NAD, Oriented x4 Cardiovascular: RRR, No Murmurs, Normal S2/S2 Respiratory: CTAB, No wheezing or Rales Abdomen: No distension or tenderness GU: vaginal discharge present, no lesion Extremities: No edema on extremities   Skin: Warm and dry  ASSESSMENT/PLAN:   Routine screening for STI (sexually transmitted infection) Patient is sexual active with recent change in partner and reports being asymptomatic. On exam was positive for non odorous vaginal discharge.  -Obtained vaginal swab for Trichomonas, GC, Candida, bacterial vaginosis -Obtained lab for HIV antibodies and RPR  -Educated patient on safe sex practices.     06/12/2021, MD [facility]

## 2021-06-26 NOTE — Patient Instructions (Addendum)
It was wonderful to meet you today. Thank you for allowing me to be a part of your care. Below is a short summary of what we discussed at your visit today:  We did a vaginal swab to test for bacterial vaginosis, trichomonas, gonorrhea, and chlamydia  Obtain blood draw for HIV and Syphilis test.  Will call you if any of the exam is abnormal or send you a Mychart message of the result if negative.  Please bring all of your medications to every appointment!  If you have any questions or concerns, please do not hesitate to contact us via phone or MyChart message.   Jerre Simon, MD [facility]

## 2021-06-26 NOTE — Assessment & Plan Note (Addendum)
Patient is sexual active with recent change in partner and reports being asymptomatic. On exam was positive for non odorous vaginal discharge.  -Obtained vaginal swab for Trichomonas, GC, Candida, bacterial vaginosis -Obtained lab for HIV antibodies and RPR  -Educated patient on safe sex practices.

## 2021-06-27 LAB — HIV ANTIBODY (ROUTINE TESTING W REFLEX): HIV Screen 4th Generation wRfx: NONREACTIVE

## 2021-06-27 LAB — RPR: RPR Ser Ql: NONREACTIVE

## 2021-06-28 LAB — CERVICOVAGINAL ANCILLARY ONLY
Bacterial Vaginitis (gardnerella): NEGATIVE
Candida Glabrata: NEGATIVE
Candida Vaginitis: NEGATIVE
Chlamydia: NEGATIVE
Comment: NEGATIVE
Comment: NEGATIVE
Comment: NEGATIVE
Comment: NEGATIVE
Comment: NEGATIVE
Comment: NORMAL
Neisseria Gonorrhea: NEGATIVE
Trichomonas: NEGATIVE

## 2021-08-21 ENCOUNTER — Other Ambulatory Visit: Payer: Self-pay

## 2021-08-21 ENCOUNTER — Emergency Department (HOSPITAL_COMMUNITY): Payer: Medicaid Other

## 2021-08-21 ENCOUNTER — Emergency Department (HOSPITAL_COMMUNITY)
Admission: EM | Admit: 2021-08-21 | Discharge: 2021-08-21 | Disposition: A | Discharge status: Home / Self Care | Payer: Medicaid Other | Attending: Emergency Medicine | Admitting: Emergency Medicine

## 2021-08-21 ENCOUNTER — Encounter (HOSPITAL_COMMUNITY): Payer: Self-pay | Admitting: Emergency Medicine

## 2021-08-21 DIAGNOSIS — Z20822 Contact with and (suspected) exposure to covid-19: Secondary | ICD-10-CM | POA: Insufficient documentation

## 2021-08-21 DIAGNOSIS — J069 Acute upper respiratory infection, unspecified: Secondary | ICD-10-CM | POA: Insufficient documentation

## 2021-08-21 DIAGNOSIS — R0981 Nasal congestion: Secondary | ICD-10-CM | POA: Diagnosis present

## 2021-08-21 LAB — RESP PANEL BY RT-PCR (FLU A&B, COVID) ARPGX2
Influenza A by PCR: NEGATIVE
Influenza B by PCR: NEGATIVE
SARS Coronavirus 2 by RT PCR: NEGATIVE

## 2021-08-21 MED ORDER — FLUTICASONE PROPIONATE 50 MCG/ACT NA SUSP: 1.0000 | Freq: Every day | NASAL | 0 refills | Status: DC

## 2021-08-21 NOTE — ED Triage Notes (Signed)
Patient coming from home, complaint of cold/ flu symptoms for approximately 1 week. Patient states she has been trying over the counter medication but nothing is helping. VSS. NAD.

## 2021-08-21 NOTE — ED Provider Triage Note (Signed)
Emergency Medicine Provider Triage Evaluation Note  Lori Weaver , a 26 y.o. female  was evaluated in triage.  Pt complains of nasal congestion ear pain, sore throat, cough, subjective fever and chills over the past 1 week.  She states that her daughter was sick with URI symptoms prior.  Her daughter is better now.  She has not had any evaluation..  Review of Systems  Positive: Sore throat, cough, chills Negative: Vomiting or diarrhea  Physical Exam  BP 115/74    Pulse 76    Temp 98.1 F (36.7 C) (Oral)    Resp 16    SpO2 100%  Gen:   Awake, no distress   Resp:  Normal effort  MSK:   Moves extremities without difficulty  Other:  TMs normal, nasal congestion noted, no posterior pharyngeal erythema or exudate.  Medical Decision Making  Medically screening exam initiated at 5:00 PM.  Appropriate orders placed.  Lori Weaver was informed that the remainder of the evaluation will be completed by another provider, this initial triage assessment does not replace that evaluation, and the importance of remaining in the ED until their evaluation is complete.     Renne Crigler, PA-C 08/21/21 1701

## 2021-08-21 NOTE — Discharge Instructions (Addendum)
Your COVID and flu test were negative today. Your symptoms are likely related to a virus.   Continue taking OTC cough and cold medication. Use flonase as directed for congestion/post nasal drip. You can also take Benadryl daily for same.   Drink plenty of fluids to stay hydrated and rest as much as possible.   Follow up with your PCP for further eval. If you do not have a PCP you can follow up with [facility] for primary care needs.   Return to the ED for any new/worsening symptoms

## 2021-08-21 NOTE — ED Provider Notes (Signed)
[facility] EMERGENCY DEPARTMENT Provider Note   CSN: [id number] Arrival date & time: 08/21/21  1551     History  Chief Complaint  Patient presents with   flu symptoms    Lori Weaver is a 26 y.o. female who presents to the ED today with complaint of flu like symptoms for the past 1 week. Pt complains of nasal congestion, cough, sore throat, post nasal drip, body aches, subjective fevers, and chills. She reports recent sick contact with her daughter who had similar symptoms however her daughter is feeling better. She has been taking OTC Dayquil, Nyquil, Benadryl without relief. She denies any difficulty swallowing or drooling. No other complaints at this time.   The history is provided by the patient and medical records.      Home Medications Prior to Admission medications   Medication Sig Start Date End Date Taking? Authorizing Provider  fluticasone (FLONASE) 50 MCG/ACT nasal spray Place 1 spray into both nostrils daily. 08/21/21  Yes Shyne Lehrke, PA-C  acetaminophen (TYLENOL) 325 MG tablet Take 2 tablets (650 mg total) by mouth every 6 (six) hours as needed. 04/10/20   Darr, Gerilyn Pilgrim, PA-C  Adapalene-Benzoyl Peroxide (EPIDUO FORTE) 0.3-2.5 % GEL Apply 1 Pump topically at bedtime. Patient not taking: Reported on 06/26/2021 08/29/20   Fayette Pho, MD  benzonatate (TESSALON) 100 MG capsule Take 1 capsule (100 mg total) by mouth 2 (two) times daily as needed for cough. Patient not taking: Reported on 06/26/2021 02/06/21   Shirlean Mylar, MD  fluticasone [facility]) 50 MCG/ACT nasal spray Place 2 sprays into both nostrils daily. 02/06/21   Shirlean Mylar, MD  ibuprofen (ADVIL) 600 MG tablet Take 1 tablet (600 mg total) by mouth every 6 (six) hours as needed. 03/30/21   Theadora Rama Scales, PA-C  nitrofurantoin, macrocrystal-monohydrate, (MACROBID) 100 MG capsule Take 1 capsule (100 mg total) by mouth 2 (two) times daily. Patient not taking: Reported on 06/26/2021 10/17/20    Rushie Chestnut, PA-C  buPROPion (WELLBUTRIN XL) 150 MG 24 hr tablet Take 1 tablet (150 mg total) by mouth daily. 05/01/20 10/17/20  Fayette Pho, MD      Allergies    Fish allergy and Soap    Review of Systems   Review of Systems  Constitutional:  Positive for chills and fever (subjective).  HENT:  Positive for congestion, postnasal drip and sore throat. Negative for trouble swallowing and voice change.   Respiratory:  Positive for cough. Negative for shortness of breath.   Musculoskeletal:  Positive for myalgias.  All other systems reviewed and are negative.  Physical Exam Updated Vital Signs BP 115/74    Pulse 76    Temp 98.1 F (36.7 C) (Oral)    Resp 16    LMP 07/23/2021 (Within Days)    SpO2 100%  Physical Exam Vitals and nursing note reviewed.  Constitutional:      Appearance: She is not ill-appearing or diaphoretic.  HENT:     Head: Normocephalic and atraumatic.     Mouth/Throat:     Mouth: Mucous membranes are moist.     Comments: Very faint posterior oropharyngeal erythema. No edema. Tonsils symmetric. Uvula midline. Phonating normally. Tolerating own secretions without difficulty.   Eyes:     Conjunctiva/sclera: Conjunctivae normal.  Cardiovascular:     Rate and Rhythm: Normal rate and regular rhythm.  Pulmonary:     Effort: Pulmonary effort is normal.     Breath sounds: Normal breath sounds. No wheezing, rhonchi or rales.  Skin:    General: Skin is warm and dry.     Coloration: Skin is not jaundiced.  Neurological:     Mental Status: She is alert.    ED Results / Procedures / Treatments   Labs (all labs ordered are listed, but only abnormal results are displayed) Labs Reviewed  RESP PANEL BY RT-PCR (FLU A&B, COVID) [id number]    EKG None  Radiology DG Chest 2 View  Result Date: 08/21/2021 CLINICAL DATA:  cough, subjective fever x 1 week EXAM: CHEST - 2 VIEW COMPARISON:  02/05/2020. FINDINGS: No consolidation. No visible pleural effusions or  pneumothorax. Cardiomediastinal silhouette is within normal limits similar to prior. IMPRESSION: No active cardiopulmonary disease. Electronically Signed   By: Feliberto Harts M.D.   On: 08/21/2021 17:24    Procedures Procedures    Medications Ordered in ED Medications - No data to display  ED Course/ Medical Decision Making/ A&P                           Medical Decision Making 26 year old female who presents to the ED today with URI-like symptoms for the past 1 week.  On arrival to the ED today vitals are stable.  Patient afebrile, nontachycardic and nontachypneic.  She was medically screened and work-up started including chest x-ray which has returned negative for any signs of pneumonia or other abnormalities.  COVID and flu testing are negative.  It does appear a strep test was ordered in triage however never collected.  Patient reports her most bothersome complaint is the congestion and postnasal drip.  She believes this is why she is having a very faint sore throat.  She denies any significant pain with swallowing.  She is noted to have very mild posterior oropharyngeal erythema on exam.  No exudate.  I had shared decision-making with patient regarding strep testing and awaiting results versus somatic treatment for likely viral upper respiratory infection.  Patient does not want to wait for strep test at this time.  We will plan to discharge home with symptomatic treatment.  Have recommended PCP follow-up for further evaluation.  Patient is in agreement with plan and stable for discharge.  Problems Addressed: Viral URI: acute illness or injury  Amount and/or Complexity of Data Reviewed Radiology: ordered.          Final Clinical Impression(s) / ED Diagnoses Final diagnoses:  Viral URI    Rx / DC Orders ED Discharge Orders          Ordered    fluticasone (FLONASE) 50 MCG/ACT nasal spray  Daily        08/21/21 2125             Discharge Instructions      Your  COVID and flu test were negative today. Your symptoms are likely related to a virus.   Continue taking OTC cough and cold medication. Use flonase as directed for congestion/post nasal drip. You can also take Benadryl daily for same.   Drink plenty of fluids to stay hydrated and rest as much as possible.   Follow up with your PCP for further eval. If you do not have a PCP you can follow up with [facility] for primary care needs.   Return to the ED for any new/worsening symptoms        Milinda Antis 08/21/21 2127    Benjiman Core, MD 08/22/21 1425

## 2021-10-16 ENCOUNTER — Encounter: Payer: Self-pay | Admitting: Family Medicine

## 2021-10-16 ENCOUNTER — Other Ambulatory Visit (HOSPITAL_COMMUNITY)
Admission: RE | Admit: 2021-10-16 | Discharge: 2021-10-16 | Disposition: A | Discharge status: Home / Self Care | Payer: Medicaid Other | Source: Ambulatory Visit | Attending: Family Medicine | Admitting: Family Medicine

## 2021-10-16 ENCOUNTER — Other Ambulatory Visit: Payer: Self-pay

## 2021-10-16 ENCOUNTER — Ambulatory Visit (INDEPENDENT_AMBULATORY_CARE_PROVIDER_SITE_OTHER): Payer: Medicaid Other | Admitting: Family Medicine

## 2021-10-16 VITALS — BP 95/60 | HR 62 | Temp 97.7°F | Wt 95.4 lb

## 2021-10-16 DIAGNOSIS — Z7251 High risk heterosexual behavior: Secondary | ICD-10-CM

## 2021-10-16 DIAGNOSIS — Z124 Encounter for screening for malignant neoplasm of cervix: Secondary | ICD-10-CM | POA: Insufficient documentation

## 2021-10-16 NOTE — Progress Notes (Signed)
? ? ?  SUBJECTIVE:  ? ?CHIEF COMPLAINT / HPI: STI check ? ?Patient is requesting sexually transmitted infection testing today.  She would like to be tested for gonorrhea, chlamydia, syphilis and HIV.  Patient is also agreeable to Pap smear today.  She denies any history of abnormal Pap smears.  She denies any current vaginal discharge or pruritus.  Patient states that she is sexually active and does not use barrier contraception.  She denies any abnormal menses and states that she is currently on her menses today.  Patient denies any abdominal pain.  She denies any dysuria.  She states that she has sexually-transmitted infection several years ago but none recently.  She states that her partner has not notified her of any sexually transmitted infections. ? ?Health Maintenance ? ?Pap Smear: 2020 ?Patient agreeable to Pap smear ?Patient declines COVID vaccination. ? ?PERTINENT  PMH / PSH:  ? ? ?OBJECTIVE:  ? ?BP 95/60   Pulse 62   Temp 97.7 ?F (36.5 ?C)   Wt 95 lb 6.4 oz (43.3 kg)   LMP 10/13/2021 (Exact Date)   SpO2 100%   BMI 19.94 kg/m?   ?Genitalia:  Normal introitus for age, no external lesions, menses blood in vaginal vault, mucosa pink and moist, no vaginal or cervical lesions, no vaginal atrophy, no friaility or hemorrhage, normal uterus size and position, no adnexal masses or tenderness ?Abdomen: Soft and nontender, no distention, bowel sounds present throughout all quadrants, no suprapubic tenderness ? ?ASSESSMENT/PLAN:  ? ?Screening for cervical cancer ?We will collect Pap smear today ?We will follow-up with results once available ? ?Unprotected sexual intercourse ?We will test for gonorrhea and chlamydia ?We will test for HIV and syphilis per patient request ?Offered PrEP, patient declines at this time ?  ? ? ?Eulis Foster, MD ?[place]  ?

## 2021-10-16 NOTE — Assessment & Plan Note (Signed)
We will collect Pap smear today ?We will follow-up with results once available ?

## 2021-10-16 NOTE — Assessment & Plan Note (Addendum)
We will test for gonorrhea and chlamydia ?We will test for HIV and syphilis per patient request ?Offered PrEP, patient declines at this time ?

## 2021-10-16 NOTE — Patient Instructions (Signed)
Today we will test for HIV, syphilis, gonorrhea and chlamydia.   ?I also collected a Pap smear.   ?I will notify you of any abnormal results and further treatment plan. ? ?Safe Sex ?Practicing safe sex means taking steps before and during sex to reduce your risk of: ?Getting an STI (sexually transmitted infection). ?Giving your partner an STI. ?Unwanted or unplanned pregnancy. ?How to practice safe sex ?Ways you can practice safe sex ? ?Limit your sexual partners to only one partner who is having sex with only you. ?Avoid using alcohol and drugs before having sex. Alcohol and drugs can affect your judgment. ?Before having sex with a new partner: ?Talk to your partner about past partners, past STIs, and drug use. ?Get screened for STIs and discuss the results with your partner. Ask your partner to get screened too. ?Check your body regularly for sores, blisters, rashes, or unusual discharge. If you notice any of these problems, visit your health care provider. ?Avoid sexual contact if you have symptoms of an infection or you are being treated for an STI. ?While having sex, use a condom. Make sure to: ?Use a condom every time you have vaginal, oral, or anal sex. Both females and males should wear condoms during oral sex. ?Keep condoms in place from the beginning to the end of sexual activity. ?Use a latex condom, if possible. Latex condoms offer the best protection. ?Use only water-based lubricants with a condom. Using petroleum-based lubricants or oils will weaken the condom and increase the chance that it will break. ?Ways your health care provider can help you practice safe sex ? ?See your health care provider for regular screenings, exams, and tests for STIs. ?Talk with your health care provider about what kind of birth control (contraception) is best for you. ?Get vaccinated against hepatitis B and human papillomavirus (HPV). ?If you are at risk of being infected with HIV (human immunodeficiency virus), talk with  your health care provider about taking a prescription medicine to prevent HIV infection. You are at risk for HIV if you: ?Are a man who has sex with other men. ?Are sexually active with more than one partner. ?Take drugs by injection. ?Have a sex partner who has HIV. ?Have unprotected sex. ?Have sex with someone who has sex with both men and women. ?Have had an STI. ?Follow these instructions at home: ?Take over-the-counter and prescription medicines only as told by your health care provider. ?Keep all follow-up visits. This is important. ?Where to find more information ?Centers for Disease Control and Prevention: FootballExhibition.com.br ?Planned Parenthood: www.plannedparenthood.org ?Office on Women's Health: http://hoffman.com/ ?Summary ?Practicing safe sex means taking steps before and during sex to reduce your risk getting an STI, giving your partner an STI, and having an unwanted or unplanned pregnancy. ?Before having sex with a new partner, talk to your partner about past partners, past STIs, and drug use. ?Use a condom every time you have vaginal, oral, or anal sex. Both females and males should wear condoms during oral sex. ?Check your body regularly for sores, blisters, rashes, or unusual discharge. If you notice any of these problems, visit your health care provider. ?See your health care provider for regular screenings, exams, and tests for STIs. ?This information is not intended to replace advice given to you by your health care provider. Make sure you discuss any questions you have with your health care provider. ?Document Revised: 12/12/2019 Document Reviewed: 12/12/2019 ?Elsevier Patient Education ? 2022 Elsevier Inc. ? ? ? ?

## 2021-10-17 LAB — RPR: RPR Ser Ql: NONREACTIVE

## 2021-10-17 LAB — HIV ANTIBODY (ROUTINE TESTING W REFLEX): HIV Screen 4th Generation wRfx: NONREACTIVE

## 2021-10-22 LAB — CYTOLOGY - PAP
Chlamydia: NEGATIVE
Comment: NEGATIVE
Comment: NEGATIVE
Comment: NEGATIVE
Comment: NORMAL
Diagnosis: UNDETERMINED — AB
High risk HPV: NEGATIVE
Neisseria Gonorrhea: NEGATIVE
Trichomonas: NEGATIVE

## 2021-11-29 ENCOUNTER — Encounter: Payer: Self-pay | Admitting: Family Medicine

## 2021-11-29 ENCOUNTER — Ambulatory Visit (INDEPENDENT_AMBULATORY_CARE_PROVIDER_SITE_OTHER): Payer: Medicaid Other | Admitting: Family Medicine

## 2021-11-29 VITALS — BP 101/53 | HR 71 | Ht <= 58 in | Wt 97.6 lb

## 2021-11-29 DIAGNOSIS — T1490XD Injury, unspecified, subsequent encounter: Secondary | ICD-10-CM

## 2021-11-29 DIAGNOSIS — Z8269 Family history of other diseases of the musculoskeletal system and connective tissue: Secondary | ICD-10-CM

## 2021-11-29 DIAGNOSIS — Z840 Family history of diseases of the skin and subcutaneous tissue: Secondary | ICD-10-CM | POA: Diagnosis not present

## 2021-11-29 DIAGNOSIS — F330 Major depressive disorder, recurrent, mild: Secondary | ICD-10-CM

## 2021-11-29 DIAGNOSIS — R5383 Other fatigue: Secondary | ICD-10-CM | POA: Diagnosis not present

## 2021-11-29 NOTE — Progress Notes (Signed)
? ? ?  SUBJECTIVE:  ? ?CHIEF COMPLAINT / HPI: screening for Lupus and scab on ear ? ?Reports painful scab on left ear for 2 days.  Does not recall any trauma or insect bite.  Noticed upon awakening one morning.  Tried emollient without relief.  ? ?Would like screening for Lupus.  Reports has not been screened in a few years. Has family history of Lupus, mother passed away at 24 with complications from Lupus and grandmother passed in 19's with Lupus.  Denies any fevers, malar rash, joint pain, weight loss.  Endorses fatigue and difficulty gaining weight. ? ?PERTINENT  PMH / PSH:  ?None ? ?OBJECTIVE:  ? ?BP (!) 101/53   Pulse 71   Ht _0  (1.473 m)   Wt 97 lb 9.6 oz (44.3 kg)   LMP 11/05/2021 (Exact Date)   SpO2 99%   BMI 20.40 kg/m?   ? ?General: Alert, no acute distress ?Left ear: small skin tear in healing process ? ? ?Cardio: Normal S1 and S2, RRR, no r/m/g ?Pulm: CTAB, normal work of breathing ?Abdomen: Bowel sounds normal. Abdomen soft and non-tender.  ?Extremities: No peripheral edema.  ? ? ?  11/29/2021  ?  2:43 PM 06/26/2021  ?  3:28 PM 02/06/2021  ? 11:07 AM 10/29/2020  ?  9:01 AM 05/08/2020  ?  2:01 PM  ?Depression screen PHQ 2/9  ?Decreased Interest 0 0 0 0 0  ?Down, Depressed, Hopeless 0 0 0 0 1  ?PHQ - 2 Score 0 0 0 0 1  ?Altered sleeping 0 0 _1 ?Tired, decreased energy 1 0 1 0 1  ?Change in appetite _2 ?Feeling bad or failure about yourself  0 0 0 0 0  ?Trouble concentrating 0 0 0 0 0  ?Moving slowly or fidgety/restless 0 0 0 0 0  ?Suicidal thoughts 0 0 0 0 0  ?PHQ-9 Score _3 ?Difficult doing work/chores Somewhat difficult Somewhat difficult   Somewhat difficult  ?  ? ?ASSESSMENT/PLAN:  ? ?Healing wound ?Small skin tear in healing stage.  Likely from unintentional trauma that she is not aware of ?Continue to use emollient ?Reassurance provided that will self resolve ?If no improvement in 2 weeks follow up with PCP ? ?Family history of lupus erythematosus ?Strong family history of  SLE.  Discussed with patient that as she is asymptomatic and physical exam benign currently there are no screening labs however could obtain CBC, ESR, CRP, to rule out inflammation.  Review of chart did not reveal any previous screening labs.   ?CBC and Ferritin today  ?Weight increased from last visit. ?Should she exhibit symptoms would be low threshold to obtain Lupus workup ?Follow up with PCP as needed ? ? ?Depression ?Reports not taking Wellbutrin, had weaned. ?PHQ9 3 ?Follow with therapist weekly ?Follow up with PCP as needed   ? ?  ? ? ?Carollee Leitz, MD ?[place]  ?

## 2021-11-29 NOTE — Patient Instructions (Addendum)
Thank you for coming to see me today. It was a pleasure.  ? ?Use Aquaphor or Vaseline on your ear as needed. ? ?We will check your blood count today for any low iron. ? ?Please follow-up with PCP as needed or if no improvement in symptoms ? ?If you have any questions or concerns, please do not hesitate to call the office at [phone]. ? ?Best,  ? ?Dana Allan, MD   ? ? ?

## 2021-11-30 LAB — CBC
Hematocrit: 36.3 % (ref 34.0–46.6)
Hemoglobin: 12 g/dL (ref 11.1–15.9)
MCH: 28.8 pg (ref 26.6–33.0)
MCHC: 33.1 g/dL (ref 31.5–35.7)
MCV: 87 fL (ref 79–97)
Platelets: 336 10*3/uL (ref 150–450)
RBC: 4.17 x10E6/uL (ref 3.77–5.28)
RDW: 11.8 % (ref 11.7–15.4)
WBC: 7.9 10*3/uL (ref 3.4–10.8)

## 2021-11-30 LAB — FERRITIN: Ferritin: 72 ng/mL (ref 15–150)

## 2021-12-02 ENCOUNTER — Encounter: Payer: Self-pay | Admitting: Family Medicine

## 2021-12-03 ENCOUNTER — Encounter: Payer: Self-pay | Admitting: Family Medicine

## 2021-12-03 DIAGNOSIS — Z8269 Family history of other diseases of the musculoskeletal system and connective tissue: Secondary | ICD-10-CM | POA: Insufficient documentation

## 2021-12-03 DIAGNOSIS — T1490XD Injury, unspecified, subsequent encounter: Secondary | ICD-10-CM | POA: Insufficient documentation

## 2021-12-03 NOTE — Assessment & Plan Note (Addendum)
Strong family history of SLE.  Discussed with patient that as she is asymptomatic and physical exam benign currently there are no screening labs however could obtain CBC, ESR, CRP, to rule out inflammation.  Review of chart did not reveal any previous screening labs.   ?CBC and Ferritin today  ?Weight increased from last visit. ?Should she exhibit symptoms would be low threshold to obtain Lupus workup ?Follow up with PCP as needed ? ?

## 2021-12-03 NOTE — Assessment & Plan Note (Signed)
Small skin tear in healing stage.  Likely from unintentional trauma that she is not aware of ?Continue to use emollient ?Reassurance provided that will self resolve ?If no improvement in 2 weeks follow up with PCP ?

## 2021-12-03 NOTE — Assessment & Plan Note (Signed)
Reports not taking Wellbutrin, had weaned. ?PHQ9 3 ?Follow with therapist weekly ?Follow up with PCP as needed   ?

## 2021-12-24 ENCOUNTER — Encounter: Payer: Self-pay | Admitting: *Deleted

## 2022-01-22 ENCOUNTER — Telehealth: Payer: Medicaid Other

## 2022-02-05 ENCOUNTER — Encounter: Payer: Medicaid Other | Admitting: Obstetrics and Gynecology

## 2022-03-30 NOTE — Patient Instructions (Incomplete)
It was great to see you today! Thank you for choosing [facility] for your primary care. Lori Weaver was seen for testing.  I will call you with the results of your labs. Please practice safe sex and let us know if you need anything.   Local Resources:  [facility] [phone] [address] [place], [place] [address] ReportNation.uy  [facility] [phone] [address], [place], [place] [address]  [facility] [phone] [address] [place], [place] [address]  [place]: [address] [place], [place] [address] [phone]  [facility] [address] [place]. [address] [place], [place] [address]  [facility] [address] [place], [place] [address] [phone] Toll Free: [phone] [email or web address]   If you haven't already, sign up for My Chart to have easy access to your labs results, and communication with your primary care physician.  We are checking some labs today. If they are abnormal, I will call you. If they are normal, I will send you a MyChart message (if it is active) or a letter in the mail. If you do not hear about your labs in the next 2 weeks, please call the office. I recommend that you always bring your medications to each appointment as this makes it easy to ensure you are on the correct medications and helps [place] not miss refills when you need them. Call the clinic at [phone] if your symptoms worsen or you have any concerns.  You should return to our clinic Return if symptoms worsen or fail to improve, for discharge. Please arrive 15 minutes before your appointment to ensure smooth check in process.  We appreciate your efforts in making this happen.  Thank you for allowing me to participate in your care, Alfredo Martinez, MD 03/31/2022, 4:20 PM PGY-2, [facility]

## 2022-03-30 NOTE — Progress Notes (Signed)
  SUBJECTIVE:   CHIEF COMPLAINT / HPI:   STI check - Patient wants to be checked for STIs.  - Had abortion 2 months and reports of new onset discharge after her last period a few days ago  - preferred gender of partner: male  - Medications tried: no - Sexually active with 1 partner(s) - Last sexual encounter: few days ago  - Contraception: condoms; does not want any other form of birth control  Symptoms include: Abnormal vaginal discharge and Abdominal/Pelvic pain   PERTINENT  PMH / PSH:   Past Medical History:  Diagnosis Date   Allergy    Anemia    Anxiety    Chlamydia    Depression    Dysmenorrhea    Gonorrhea    Possible pregnancy 11/03/2019   Post-dates pregnancy 04/09/2019   Postpartum care and examination 05/13/2019   Supervision of other normal pregnancy, antepartum 09/15/2018    Nursing Staff Provider Office Location  [facility] Dating  LMP, consistent with early [place] Language  English Anatomy US  Normal Flu Vaccine  Declined-09/15/2018 Genetic Screen  NIPS: low risk, female  AFP:  Screen negative  TDaP vaccine    Hgb A1C or  GTT Early  Third trimester 1 hour 62 Rhogam  NA   LAB RESULTS  Feeding Plan Breast Blood Type B/Positive/-- (02/26 0000)  Contraception None Antibody Ne   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    OBJECTIVE:  BP 100/65   Pulse 68   Temp 98.2 F (36.8 C)   Wt 98 lb 9.6 oz (44.7 kg)   SpO2 96%   BMI 20.61 kg/m   General: NAD, pleasant, able to participate in exam Respiratory: normal WOB GU: Chaperoned by CMA Gilberto Better, appearance of white, thin discharge with brown tint in vaginal vault without good visualization of vaginal walls.  Psych: Normal affect and mood  ASSESSMENT/PLAN:  Unprotected sexual intercourse Reviewed labs and allergies, offered condoms, will check GC/Chlamydia, Trichomonas, RPR and HIV as well as wet prep and will call patient with results. Discussed safe sex practices. Will schedule pap if  indicated. Patient's questions answered to their satisfaction.    Orders Placed This Encounter  Procedures   HIV antibody (with reflex)   RPR   POCT Wet Prep [facility])   No orders of the defined types were placed in this encounter.  Return if symptoms worsen or fail to improve, for discharge. Alfredo Martinez, MD 03/31/2022, 4:29 PM PGY-2, [facility]

## 2022-03-31 ENCOUNTER — Other Ambulatory Visit (HOSPITAL_COMMUNITY)
Admission: RE | Admit: 2022-03-31 | Discharge: 2022-03-31 | Disposition: A | Discharge status: Home / Self Care | Payer: Medicaid Other | Source: Ambulatory Visit | Attending: Family Medicine | Admitting: Family Medicine

## 2022-03-31 ENCOUNTER — Encounter: Payer: Self-pay | Admitting: Student

## 2022-03-31 ENCOUNTER — Ambulatory Visit (INDEPENDENT_AMBULATORY_CARE_PROVIDER_SITE_OTHER): Payer: Medicaid Other | Admitting: Student

## 2022-03-31 VITALS — BP 100/65 | HR 68 | Temp 98.2°F | Wt 98.6 lb

## 2022-03-31 DIAGNOSIS — Z7251 High risk heterosexual behavior: Secondary | ICD-10-CM

## 2022-03-31 LAB — POCT WET PREP (WET MOUNT)
Clue Cells Wet Prep Whiff POC: POSITIVE
Trichomonas Wet Prep HPF POC: ABSENT

## 2022-03-31 NOTE — Assessment & Plan Note (Signed)
Reviewed labs and allergies, offered condoms, will check GC/Chlamydia, Trichomonas, RPR and HIV as well as wet prep and will call patient with results. Discussed safe sex practices. Will schedule pap if indicated. Patient's questions answered to their satisfaction.

## 2022-04-01 ENCOUNTER — Other Ambulatory Visit: Payer: Self-pay | Admitting: Student

## 2022-04-01 DIAGNOSIS — B9689 Other specified bacterial agents as the cause of diseases classified elsewhere: Secondary | ICD-10-CM

## 2022-04-01 LAB — HIV ANTIBODY (ROUTINE TESTING W REFLEX): HIV Screen 4th Generation wRfx: NONREACTIVE

## 2022-04-01 LAB — RPR: RPR Ser Ql: NONREACTIVE

## 2022-04-01 MED ORDER — METRONIDAZOLE 500 MG PO TABS
500.0000 mg | ORAL_TABLET | Freq: Two times a day (BID) | ORAL | 0 refills | Status: AC
Start: 2022-04-01 — End: 2022-04-08

## 2022-04-02 LAB — CERVICOVAGINAL ANCILLARY ONLY
Chlamydia: NEGATIVE
Comment: NEGATIVE
Comment: NORMAL
Neisseria Gonorrhea: NEGATIVE

## 2022-06-16 ENCOUNTER — Ambulatory Visit (HOSPITAL_COMMUNITY)
Admission: EM | Admit: 2022-06-16 | Discharge: 2022-06-16 | Disposition: A | Discharge status: Home / Self Care | Payer: Medicaid Other | Attending: Physician Assistant | Admitting: Physician Assistant

## 2022-06-16 ENCOUNTER — Encounter (HOSPITAL_COMMUNITY): Payer: Self-pay

## 2022-06-16 ENCOUNTER — Ambulatory Visit (INDEPENDENT_AMBULATORY_CARE_PROVIDER_SITE_OTHER): Payer: Medicaid Other

## 2022-06-16 DIAGNOSIS — R0789 Other chest pain: Secondary | ICD-10-CM

## 2022-06-16 DIAGNOSIS — R079 Chest pain, unspecified: Secondary | ICD-10-CM

## 2022-06-16 DIAGNOSIS — M940 Chondrocostal junction syndrome [Tietze]: Secondary | ICD-10-CM

## 2022-06-16 MED ORDER — KETOROLAC TROMETHAMINE 30 MG/ML IJ SOLN
INTRAMUSCULAR | Status: AC
  Filled 2022-06-16: qty 1

## 2022-06-16 MED ORDER — KETOROLAC TROMETHAMINE 30 MG/ML IJ SOLN
30.0000 mg | Freq: Once | INTRAMUSCULAR | Status: AC
  Administered 2022-06-16: 30 mg via INTRAMUSCULAR

## 2022-06-16 MED ORDER — METHOCARBAMOL 500 MG PO TABS: 500.0000 mg | ORAL_TABLET | Freq: Two times a day (BID) | ORAL | 0 refills | Status: AC

## 2022-06-16 MED ORDER — PREDNISONE 20 MG PO TABS: 40.0000 mg | ORAL_TABLET | Freq: Every day | ORAL | 0 refills | Status: AC

## 2022-06-16 NOTE — ED Triage Notes (Signed)
Pt c/o epigastric pain radiating up to center of chest since yesterday after eating. States took pain relievers with no relief.

## 2022-06-16 NOTE — ED Triage Notes (Signed)
Pt is here for abdominal pain and pulse rate up and down since today . P[t is also wants a STD test

## 2022-06-16 NOTE — Discharge Instructions (Signed)
Your EKG and chest x-ray were normal.  We gave an injection of Toradol today and are starting you on prednisone tomorrow so please do not take NSAIDs for the next 5 days.  This includes aspirin, ibuprofen/Advil, naproxen/Aleve.  You can use acetaminophen/Tylenol.  Take Robaxin up to twice a day.  This make you sleepy do not drive or drink alcohol while taking it.  Rest and drink plenty of fluid.  Follow-up with your primary care as soon as possible.  If you have any changing or worsening symptoms including increased pain, nausea/vomiting, weakness, shortness of breath, heart racing you need to go to the emergency room immediately.

## 2022-06-16 NOTE — ED Provider Notes (Signed)
[facility]    CSN: [id number] Arrival date & time: 06/16/22  1812      History   Chief Complaint Chief Complaint  Patient presents with   Abdominal Pain    HPI Lori Weaver is a 26 y.o. female.   Patient presents today with a 2-day history of central chest pain.  She reports that she has tried Tylenol without improvement of symptoms.  Pain is rated 10 on a 0-10 pain scale, described as aching, worse with deep breath, no alleviating factors identified.  She denies any recent hospitalization, surgical procedure, exogenous hormone use, active malignancy, recent COVID infection, history of VTE event.  She is confident that she is not pregnant.  She denies any recent illness or additional symptoms including cough or congestion.  She denies recent alcohol consumption or regular NSAID use.  She denies any abdominal pain, nausea, vomiting.  She is having difficulty managing pain and cannot get comfortable.  She denies any recent injury or change in activity prior to symptom onset.  She denies any history of cardiovascular disease.  Denies any association with activity.    Past Medical History:  Diagnosis Date   Allergy    Anemia    Anxiety    Chlamydia    Depression    Dysmenorrhea    Gonorrhea    Possible pregnancy 11/03/2019   Post-dates pregnancy 04/09/2019   Postpartum care and examination 05/13/2019   Supervision of other normal pregnancy, antepartum 09/15/2018    Nursing Staff Provider Office Location  [facility] Dating  LMP, consistent with early [place] Language  English Anatomy US  Normal Flu Vaccine  Declined-09/15/2018 Genetic Screen  NIPS: low risk, female  AFP:  Screen negative  TDaP vaccine    Hgb A1C or  GTT Early  Third trimester 1 hour 62 Rhogam  NA   LAB RESULTS  Feeding Plan Breast Blood Type B/Positive/-- (02/26 0000)  Contraception None Antibody Ne   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    Patient Active Problem List   Diagnosis Date  Noted   Healing wound 12/03/2021   Screening for cervical cancer 10/16/2021   Routine screening for STI (sexually transmitted infection) 06/26/2021   Breast feeding status of mother 05/08/2020   Unprotected sexual intercourse 12/07/2019   Weight loss, unintentional 09/16/2019   Anxiety 09/15/2018   Dyspareunia in female 01/19/2018   Family history of lupus erythematosus 12/02/2017   Depression 12/02/2017   Acne vulgaris 12/02/2017    Past Surgical History:  Procedure Laterality Date   LAPAROSCOPY N/A 05/06/2017   Procedure: LAPAROSCOPY DIAGNOSTIC;  Surgeon: Tereso Newcomer, MD;  Location: [facility];  Service: Gynecology;  Laterality: N/A;   LAPAROSCOPY ABDOMEN DIAGNOSTIC     TONSILLECTOMY AND ADENOIDECTOMY     TYMPANOSTOMY TUBE PLACEMENT Bilateral    WISDOM TOOTH EXTRACTION      OB History     Gravida  1   Para  1   Term  1   Preterm      AB      Living  1      SAB      IAB      Ectopic      Multiple  0   Live Births  1            Home Medications    Prior to Admission medications   Medication Sig Start Date End Date Taking? Authorizing Provider  methocarbamol (ROBAXIN) 500 MG  tablet Take 1 tablet (500 mg total) by mouth 2 (two) times daily. 06/16/22  Yes Laraya Pestka K, PA-C  predniSONE (DELTASONE) 20 MG tablet Take 2 tablets (40 mg total) by mouth daily for 4 days. 06/16/22 06/20/22 Yes Marializ Ferrebee, Noberto RetortErin K, PA-C  acetaminophen (TYLENOL) 325 MG tablet Take 2 tablets (650 mg total) by mouth every 6 (six) hours as needed. 04/10/20   Darr, Gerilyn PilgrimJacob, PA-C  buPROPion (WELLBUTRIN XL) 150 MG 24 hr tablet Take 1 tablet (150 mg total) by mouth daily. 05/01/20 10/17/20  Fayette PhoLynn, Catherine, MD    Family History Family History  Problem Relation Age of Onset   Lupus Mother    Depression Mother    Anxiety disorder Mother    Migraines Mother    Lupus Maternal Grandmother     Social History Social History   Tobacco Use   Smoking status: Never    Smokeless tobacco: Never  Vaping Use   Vaping Use: Never used  Substance Use Topics   Alcohol use: Not Currently   Drug use: Not Currently    Types: Marijuana     Allergies   Fish allergy and Soap   Review of Systems Review of Systems  Constitutional:  Positive for activity change. Negative for appetite change, fatigue and fever.  HENT:  Negative for congestion, sinus pressure, sneezing and sore throat.   Respiratory:  Positive for shortness of breath. Negative for cough.   Cardiovascular:  Positive for chest pain.  Gastrointestinal:  Negative for abdominal pain, diarrhea, nausea and vomiting.  Musculoskeletal:  Negative for arthralgias and myalgias.     Physical Exam Triage Vital Signs ED Triage Vitals [06/16/22 1903]  Enc Vitals Group     BP 118/74     Pulse Rate 96     Resp 18     Temp 98.1 F (36.7 C)     Temp Source Oral     SpO2 99 %     Weight      Height      Head Circumference      Peak Flow      Pain Score 9     Pain Loc      Pain Edu?      Excl. in GC?    No data found.  Updated Vital Signs BP (!) 155/100 (BP Location: Right Arm)   Pulse 96   Temp 98 F (36.7 C) (Oral)   Resp 12   LMP 05/09/2022   SpO2 97%   Visual Acuity Right Eye Distance:   Left Eye Distance:   Bilateral Distance:    Right Eye Near:   Left Eye Near:    Bilateral Near:     Physical Exam Vitals reviewed.  Constitutional:      General: She is awake. She is not in acute distress.    Appearance: Normal appearance. She is well-developed. She is ill-appearing.     Comments: Very pleasant female appears stated age obviously uncomfortable and tearful  HENT:     Head: Normocephalic and atraumatic.  Cardiovascular:     Rate and Rhythm: Normal rate and regular rhythm.     Heart sounds: Normal heart sounds, S1 normal and S2 normal. No murmur heard. Pulmonary:     Effort: Pulmonary effort is normal.     Breath sounds: Normal breath sounds. No wheezing, rhonchi or rales.      Comments: Clear to auscultation bilaterally Chest:     Chest wall: Tenderness present. No deformity or swelling.  Comments: Significant tenderness palpation over sternum and anterior chest wall; pain is reproducible on exam. Abdominal:     General: Bowel sounds are normal.     Palpations: Abdomen is soft.     Tenderness: There is no abdominal tenderness. There is no right CVA tenderness, left CVA tenderness, guarding or rebound.     Comments: Benign abdominal exam.  Psychiatric:        Behavior: Behavior is cooperative.      UC Treatments / Results  Labs (all labs ordered are listed, but only abnormal results are displayed) Labs Reviewed - No data to display  EKG   Radiology DG Chest 2 View  Result Date: 06/16/2022 CLINICAL DATA:  Chest wall pain radiating from the epigastric area. No relief with pain reliever meds. EXAM: CHEST - 2 VIEW COMPARISON:  PA and lateral 08/21/2021. FINDINGS: The heart size and mediastinal contours are within normal limits. Both lungs are clear. The visualized skeletal structures are unremarkable. IMPRESSION: No active cardiopulmonary disease. Electronically Signed   By: Almira Bar M.D.   On: 06/16/2022 20:02    Procedures Procedures (including critical care time)  Medications Ordered in UC Medications  ketorolac (TORADOL) 30 MG/ML injection 30 mg (30 mg Intramuscular Given 06/16/22 1929)    Initial Impression / Assessment and Plan / UC Course  I have reviewed the triage vital signs and the nursing notes.  Pertinent labs & imaging results that were available during my care of the patient were reviewed by me and considered in my medical decision making (see chart for details).     Patient is well-appearing, afebrile, nontoxic, nontachycardic.  EKG was obtained that showed normal sinus rhythm with sinus arrhythmia and a ventricular rate of 63 bpm without ischemic changes; compared to 02/05/2020 tracing nonspecific ST changes in aVL and V2.   Patient denied any abdominal pain or GERD symptoms as we did not try GI cocktail as she had pain that is reproducible and centered over her sternum/anterior chest wall.  She was given ketorolac with temporary improvement of symptoms.  Chest x-ray was obtained that showed no acute cardiopulmonary disease.  She was given Toradol injection in clinic with improvement but not resolution of symptoms; report that pain decreased from a 10 to a 6.  Will start prednisone burst of 40 mg for 4 days.  She was instructed not to take additional NSAIDs with this medication.  She was also given Robaxin to help with muscular pain with instruction not to drive or drink alcohol with taking this medication.  She is to eat a bland diet and avoid alcohol.  Discussed that she would need to take prednisone with food to avoid GI upset.  Recommended close follow-up with her primary care and she is to call them to schedule an appointment soon as possible for [facility] tomorrow.  If she has any changing or worsening symptoms including increased chest pain, change in character of pain, shortness of breath, nausea, vomiting, weakness she needs to go to the emergency room immediately.  Strict return precautions given.  Work excuse note provided.  Final Clinical Impressions(s) / UC Diagnoses   Final diagnoses:  Costochondritis  Chest wall pain     Discharge Instructions      Your EKG and chest x-ray were normal.  We gave an injection of Toradol today and are starting you on prednisone tomorrow so please do not take NSAIDs for the next 5 days.  This includes aspirin, ibuprofen/Advil, naproxen/Aleve.  You can use acetaminophen/Tylenol.  Take Robaxin up to twice a day.  This make you sleepy do not drive or drink alcohol while taking it.  Rest and drink plenty of fluid.  Follow-up with your primary care as soon as possible.  If you have any changing or worsening symptoms including increased pain, nausea/vomiting, weakness, shortness of breath,  heart racing you need to go to the emergency room immediately.     ED Prescriptions     Medication Sig Dispense Auth. Provider   predniSONE (DELTASONE) 20 MG tablet Take 2 tablets (40 mg total) by mouth daily for 4 days. 8 tablet Trevian Hayashida K, PA-C   methocarbamol (ROBAXIN) 500 MG tablet Take 1 tablet (500 mg total) by mouth 2 (two) times daily. 20 tablet Lakedra Washington, Noberto Retort, PA-C      PDMP not reviewed this encounter.   Jeani Hawking, PA-C 06/16/22 2024

## 2022-07-19 ENCOUNTER — Encounter (HOSPITAL_COMMUNITY): Payer: Self-pay

## 2022-07-19 ENCOUNTER — Ambulatory Visit (HOSPITAL_COMMUNITY)
Admission: EM | Admit: 2022-07-19 | Discharge: 2022-07-19 | Disposition: A | Discharge status: Home / Self Care | Payer: Medicaid Other | Attending: Urgent Care | Admitting: Urgent Care

## 2022-07-19 DIAGNOSIS — H66002 Acute suppurative otitis media without spontaneous rupture of ear drum, left ear: Secondary | ICD-10-CM

## 2022-07-19 DIAGNOSIS — J069 Acute upper respiratory infection, unspecified: Secondary | ICD-10-CM | POA: Diagnosis not present

## 2022-07-19 MED ORDER — AMOXICILLIN 875 MG PO TABS: 875.0000 mg | ORAL_TABLET | Freq: Two times a day (BID) | ORAL | 0 refills | Status: AC

## 2022-07-19 MED ORDER — FLUCONAZOLE 150 MG PO TABS: 150.0000 mg | ORAL_TABLET | Freq: Once | ORAL | 0 refills | Status: AC

## 2022-07-19 MED ORDER — FLUTICASONE PROPIONATE 50 MCG/ACT NA SUSP: 1.0000 | Freq: Two times a day (BID) | NASAL | 0 refills | Status: AC

## 2022-07-19 NOTE — Discharge Instructions (Signed)
You have a viral upper respiratory infection, however this is caused left otitis media which is a bacterial infection. Please start taking amoxicillin twice daily until gone.  Please complete all 10 days or the infection could come back Please use the Flonase, 1 spray in each nostril up to twice daily to help with your congestion and taste. Continue using plain Mucinex with plenty of water to help break up any phlegm. Only take the Diflucan if symptoms of vaginal itching occurs secondary to the antibiotic.

## 2022-07-19 NOTE — ED Triage Notes (Signed)
Pt is here for sore throat, cough, body aches, chill, bilateral ear pain , back pain, no taste or smell , runny nose congestion, weakness, loss of appetite x5 days

## 2022-07-19 NOTE — ED Provider Notes (Signed)
[facility]    CSN: [id number] Arrival date & time: 07/19/22  1610      History   Chief Complaint Chief Complaint  Patient presents with   Sore Throat   Shortness of Breath   Otalgia   Cough    HPI Lori Weaver is a 26 y.o. female.   Pt is here for sore throat, cough, body aches, chills, bilateral ear pain, no taste or smell, runny nose, congestion, weakness, loss of appetite x5 days. Pt states a scratchy throat was her first symptom on Monday. On Tuesday, she reports the rest of the symptoms occurred. She has not had a known fever. States her body feels run down. Her daughter was recently ill with a URI, but she is better. Pt states the loss of taste and smell she just noticed today, her 5th day of symptoms. Denies known covid exposure, did not complete any home covid tests. Is having bilateral ear pains as well. States she has been taking OTC mucinex which has helped her cough up thick [place] mucous. No nausea, vomiting or diarrhea. No rash.   Sore Throat Associated symptoms include shortness of breath.  Shortness of Breath Associated symptoms: cough and ear pain   Otalgia Associated symptoms: cough   Cough Associated symptoms: ear pain and shortness of breath     Past Medical History:  Diagnosis Date   Allergy    Anemia    Anxiety    Chlamydia    Depression    Dysmenorrhea    Gonorrhea    Possible pregnancy 11/03/2019   Post-dates pregnancy 04/09/2019   Postpartum care and examination 05/13/2019   Supervision of other normal pregnancy, antepartum 09/15/2018    Nursing Staff Provider Office Location  [facility] Dating  LMP, consistent with early [place] Language  English Anatomy US  Normal Flu Vaccine  Declined-09/15/2018 Genetic Screen  NIPS: low risk, female  AFP:  Screen negative  TDaP vaccine    Hgb A1C or  GTT Early  Third trimester 1 hour 62 Rhogam  NA   LAB RESULTS  Feeding Plan Breast Blood Type B/Positive/-- (02/26 0000)  Contraception None Antibody Ne    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    Patient Active Problem List   Diagnosis Date Noted   Healing wound 12/03/2021   Screening for cervical cancer 10/16/2021   Routine screening for STI (sexually transmitted infection) 06/26/2021   Breast feeding status of mother 05/08/2020   Unprotected sexual intercourse 12/07/2019   Weight loss, unintentional 09/16/2019   Anxiety 09/15/2018   Dyspareunia in female 01/19/2018   Family history of lupus erythematosus 12/02/2017   Depression 12/02/2017   Acne vulgaris 12/02/2017    Past Surgical History:  Procedure Laterality Date   LAPAROSCOPY N/A 05/06/2017   Procedure: LAPAROSCOPY DIAGNOSTIC;  Surgeon: Tereso Newcomer, MD;  Location: [facility];  Service: Gynecology;  Laterality: N/A;   LAPAROSCOPY ABDOMEN DIAGNOSTIC     TONSILLECTOMY AND ADENOIDECTOMY     TYMPANOSTOMY TUBE PLACEMENT Bilateral    WISDOM TOOTH EXTRACTION      OB History     Gravida  1   Para  1   Term  1   Preterm      AB      Living  1      SAB      IAB      Ectopic      Multiple  0   Live Births  1  Home Medications    Prior to Admission medications   Medication Sig Start Date End Date Taking? Authorizing Provider  amoxicillin (AMOXIL) 875 MG tablet Take 1 tablet (875 mg total) by mouth 2 (two) times daily for 10 days. 07/19/22 07/29/22 Yes Briston Lax L, PA  fluconazole (DIFLUCAN) 150 MG tablet Take 1 tablet (150 mg total) by mouth once for 1 dose. If vaginal irritation occurs 07/19/22 07/19/22 Yes Willem Klingensmith L, PA  fluticasone (FLONASE) 50 MCG/ACT nasal spray Place 1 spray into both nostrils in the morning and at bedtime. 07/19/22  Yes Jackye Dever L, PA  acetaminophen (TYLENOL) 325 MG tablet Take 2 tablets (650 mg total) by mouth every 6 (six) hours as needed. 04/10/20   Darr, Gerilyn Pilgrim, PA-C  methocarbamol (ROBAXIN) 500 MG tablet Take 1 tablet (500 mg total) by mouth 2 (two) times  daily. 06/16/22   Raspet, Noberto Retort, PA-C  buPROPion (WELLBUTRIN XL) 150 MG 24 hr tablet Take 1 tablet (150 mg total) by mouth daily. 05/01/20 10/17/20  Fayette Pho, MD    Family History Family History  Problem Relation Age of Onset   Lupus Mother    Depression Mother    Anxiety disorder Mother    Migraines Mother    Lupus Maternal Grandmother     Social History Social History   Tobacco Use   Smoking status: Never   Smokeless tobacco: Never  Vaping Use   Vaping Use: Never used  Substance Use Topics   Alcohol use: Not Currently   Drug use: Not Currently    Types: Marijuana     Allergies   Fish allergy and Soap   Review of Systems Review of Systems  HENT:  Positive for ear pain.   Respiratory:  Positive for cough and shortness of breath.   As per HPI   Physical Exam Triage Vital Signs ED Triage Vitals  Enc Vitals Group     BP 07/19/22 1822 107/73     Pulse Rate 07/19/22 1822 (!) 115     Resp 07/19/22 1822 12     Temp 07/19/22 1822 98.3 F (36.8 C)     Temp Source 07/19/22 1822 Oral     SpO2 07/19/22 1822 98 %     Weight --      Height --      Head Circumference --      Peak Flow --      Pain Score 07/19/22 1820 10     Pain Loc --      Pain Edu? --      Excl. in GC? --    No data found.  Updated Vital Signs BP 107/73 (BP Location: Right Arm)   Pulse (!) 115   Temp 98.3 F (36.8 C) (Oral)   Resp 12   LMP 07/11/2022   SpO2 98%   Visual Acuity Right Eye Distance:   Left Eye Distance:   Bilateral Distance:    Right Eye Near:   Left Eye Near:    Bilateral Near:     Physical Exam Vitals and nursing note reviewed.  Constitutional:      General: She is not in acute distress.    Appearance: She is well-developed and normal weight. She is not ill-appearing, toxic-appearing or diaphoretic.  HENT:     Head: Normocephalic and atraumatic.     Right Ear: Ear canal normal. No swelling or tenderness. A middle ear effusion is present. Tympanic  membrane is not erythematous.     Left Ear:  No drainage, swelling or tenderness. A middle ear effusion is present. Tympanic membrane is erythematous (bulging, loss of light reflex).     Nose: Congestion (turbinate hypertrophy) and rhinorrhea (thick, mucoid) present.     Mouth/Throat:     Mouth: Mucous membranes are moist. No oral lesions.     Pharynx: Oropharynx is clear. Uvula midline. No pharyngeal swelling, oropharyngeal exudate, posterior oropharyngeal erythema or uvula swelling.     Tonsils: No tonsillar exudate or tonsillar abscesses.     Comments: Thick post nasal drip noted to posterior pharynx Eyes:     Extraocular Movements:     Right eye: Normal extraocular motion.     Left eye: Normal extraocular motion.     Conjunctiva/sclera: Conjunctivae normal.     Pupils: Pupils are equal, round, and reactive to light.  Neck:     Thyroid: No thyromegaly.  Cardiovascular:     Rate and Rhythm: Regular rhythm. Tachycardia present.     Comments: Recheck by provider 100 bpm Pulmonary:     Effort: Pulmonary effort is normal. No respiratory distress.     Breath sounds: Normal breath sounds. No stridor. No wheezing, rhonchi or rales.  Chest:     Chest wall: No tenderness.  Musculoskeletal:     Cervical back: Normal range of motion and neck supple.  Lymphadenopathy:     Cervical: No cervical adenopathy (B anterior cervical chain).  Skin:    General: Skin is warm and dry.     Capillary Refill: Capillary refill takes less than 2 seconds.     Coloration: Skin is not pale.     Findings: No erythema or rash.  Neurological:     General: No focal deficit present.     Mental Status: She is alert and oriented to person, place, and time.  Psychiatric:        Mood and Affect: Mood normal.        Behavior: Behavior normal.      UC Treatments / Results  Labs (all labs ordered are listed, but only abnormal results are displayed) Labs Reviewed - No data to display  EKG   Radiology No  results found.  Procedures Procedures (including critical care time)  Medications Ordered in UC Medications - No data to display  Initial Impression / Assessment and Plan / UC Course  I have reviewed the triage vital signs and the nursing notes.  Pertinent labs & imaging results that were available during my care of the patient were reviewed by me and considered in my medical decision making (see chart for details).     Viral URI - pt with sx clinically concerning for covid, however pt is on 5th complete day of symptoms therefore testing not indicated.  L OM - infection noted to L OM, likely secondary to copious nasal congestion and drainage. Will start amoxicillin BID x 10 days, flonase to help with congestion, continue mucinex  Final Clinical Impressions(s) / UC Diagnoses   Final diagnoses:  Upper respiratory tract infection, unspecified type  Non-recurrent acute suppurative otitis media of left ear without spontaneous rupture of tympanic membrane     Discharge Instructions      You have a viral upper respiratory infection, however this is caused left otitis media which is a bacterial infection. Please start taking amoxicillin twice daily until gone.  Please complete all 10 days or the infection could come back Please use the Flonase, 1 spray in each nostril up to twice daily to help with your congestion  and taste. Continue using plain Mucinex with plenty of water to help break up any phlegm. Only take the Diflucan if symptoms of vaginal itching occurs secondary to the antibiotic.    ED Prescriptions     Medication Sig Dispense Auth. Provider   amoxicillin (AMOXIL) 875 MG tablet Take 1 tablet (875 mg total) by mouth 2 (two) times daily for 10 days. 20 tablet Brealynn Contino L, PA   fluconazole (DIFLUCAN) 150 MG tablet Take 1 tablet (150 mg total) by mouth once for 1 dose. If vaginal irritation occurs 1 tablet Vana Arif L, PA   fluticasone (FLONASE) 50 MCG/ACT nasal  spray Place 1 spray into both nostrils in the morning and at bedtime. 16 mL Valmai Vandenberghe L, PA      PDMP not reviewed this encounter.   Maretta Bees, [place] 07/19/22 [phone]

## 2022-08-18 ENCOUNTER — Encounter: Payer: Medicaid Other | Admitting: Family Medicine

## 2022-08-26 ENCOUNTER — Encounter: Payer: Medicaid Other | Admitting: Family Medicine

## 2022-09-05 ENCOUNTER — Encounter: Payer: Medicaid Other | Admitting: Family Medicine

## 2022-09-05 DIAGNOSIS — Z113 Encounter for screening for infections with a predominantly sexual mode of transmission: Secondary | ICD-10-CM

## 2022-09-17 ENCOUNTER — Encounter: Payer: Medicaid Other | Admitting: Family Medicine

## 2022-11-10 ENCOUNTER — Encounter: Payer: Self-pay | Admitting: Family Medicine

## 2022-11-10 ENCOUNTER — Other Ambulatory Visit (HOSPITAL_COMMUNITY)
Admission: RE | Admit: 2022-11-10 | Discharge: 2022-11-10 | Disposition: A | Discharge status: Home / Self Care | Payer: Medicaid Other | Source: Ambulatory Visit | Attending: Family Medicine | Admitting: Family Medicine

## 2022-11-10 ENCOUNTER — Ambulatory Visit (INDEPENDENT_AMBULATORY_CARE_PROVIDER_SITE_OTHER): Payer: Medicaid Other | Admitting: Family Medicine

## 2022-11-10 VITALS — BP 104/64 | HR 75 | Ht <= 58 in | Wt 98.2 lb

## 2022-11-10 DIAGNOSIS — Z113 Encounter for screening for infections with a predominantly sexual mode of transmission: Secondary | ICD-10-CM

## 2022-11-10 DIAGNOSIS — Z124 Encounter for screening for malignant neoplasm of cervix: Secondary | ICD-10-CM | POA: Diagnosis not present

## 2022-11-10 DIAGNOSIS — Z0000 Encounter for general adult medical examination without abnormal findings: Secondary | ICD-10-CM

## 2022-11-10 NOTE — Patient Instructions (Signed)
It was wonderful to see you today. Thank you for allowing me to be a part of your care. Below is a short summary of what we discussed at your visit today:  Annual physical Basic blood work today. If the results are normal, I will send you a letter or MyChart message. If the results are abnormal, I will give you a call.    STI screening - Today we obtained a vaginal swab to screen for gonorrhea, chlamydia, and trichomonas - We also obtained a blood sample to screen for HIV and syphilis - As above, if the results are normal, I will send you a letter or MyChart message. If the results are abnormal, I will give you a call.     Vaccines Today you declined the TDaP vaccine.  If you change your mind at any time, you may simply call the clinic and make a vaccine only appointment with a nurse at your convenience.       PAP smear Next due in 2026 based on results from 10/16/2021.  Please bring all of your medications to every appointment!  If you have any questions or concerns, please do not hesitate to contact us via phone or MyChart message.   Fayette Pho, MD

## 2022-11-11 DIAGNOSIS — Z0000 Encounter for general adult medical examination without abnormal findings: Secondary | ICD-10-CM | POA: Insufficient documentation

## 2022-11-11 LAB — BASIC METABOLIC PANEL
BUN/Creatinine Ratio: 10 (ref 9–23)
BUN: 8 mg/dL (ref 6–20)
CO2: 20 mmol/L (ref 20–29)
Calcium: 9.7 mg/dL (ref 8.7–10.2)
Chloride: 101 mmol/L (ref 96–106)
Creatinine, Ser: 0.77 mg/dL (ref 0.57–1.00)
Glucose: 70 mg/dL (ref 70–99)
Potassium: 4.1 mmol/L (ref 3.5–5.2)
Sodium: 138 mmol/L (ref 134–144)
eGFR: 110 mL/min/{1.73_m2} (ref >59)

## 2022-11-11 LAB — CBC
Hematocrit: 36.7 % (ref 34.0–46.6)
Hemoglobin: 12.5 g/dL (ref 11.1–15.9)
MCH: 29.6 pg (ref 26.6–33.0)
MCHC: 34.1 g/dL (ref 31.5–35.7)
MCV: 87 fL (ref 79–97)
Platelets: 365 10*3/uL (ref 150–450)
RBC: 4.23 x10E6/uL (ref 3.77–5.28)
RDW: 11.2 % — ABNORMAL LOW (ref 11.7–15.4)
WBC: 7 10*3/uL (ref 3.4–10.8)

## 2022-11-11 LAB — CERVICOVAGINAL ANCILLARY ONLY
Chlamydia: NEGATIVE
Comment: NEGATIVE
Comment: NEGATIVE
Comment: NORMAL
Neisseria Gonorrhea: NEGATIVE
Trichomonas: NEGATIVE

## 2022-11-11 LAB — RPR: RPR Ser Ql: NONREACTIVE

## 2022-11-11 LAB — HIV ANTIBODY (ROUTINE TESTING W REFLEX): HIV Screen 4th Generation wRfx: NONREACTIVE

## 2022-11-11 NOTE — Assessment & Plan Note (Signed)
CBC and BMP today to ensure normalization from last labs. No complaints today. Will follow results.

## 2022-11-11 NOTE — Assessment & Plan Note (Signed)
Routine screening. Blood work and vaginal swab. Will follow results.

## 2022-11-11 NOTE — Progress Notes (Signed)
    SUBJECTIVE:   CHIEF COMPLAINT / HPI:   Annual physical  Lori Weaver is a pleasant 27 y.o. woman who presents for her annual physical exam.  Medications and allergies updated.  Vaccines due: COVID booster Screenings due: None Pap: UTD 10/16/2021 ASCUS, HPV negative (ASCCP recommends 3 year follow up)  STI check -Concern for specific exposure? No -Current symptoms: None -Denies Pain, rashes, lesions, abnormal discharge  -Contraception: Condoms -Consistent barrier method use: Yes -LMP: 10/30/22 -PAP: UTD, see above -Indication for HIV PrEP: No  PERTINENT  PMH / PSH:  Patient Active Problem List   Diagnosis Date Noted   Healthcare maintenance 11/11/2022   Healing wound 12/03/2021   Screening for cervical cancer 10/16/2021   Routine screening for STI (sexually transmitted infection) 06/26/2021   Breast feeding status of mother 05/08/2020   Unprotected sexual intercourse 12/07/2019   Weight loss, unintentional 09/16/2019   Anxiety 09/15/2018   Dyspareunia in female 01/19/2018   Family history of lupus erythematosus 12/02/2017   Depression 12/02/2017   Acne vulgaris 12/02/2017    OBJECTIVE:   BP 104/64   Pulse 75   Ht  (1.473 m)   Wt 98 lb 3.2 oz (44.5 kg)   LMP 10/30/2022   SpO2 99%   BMI 20.52 kg/m    Physical Exam General: Awake, alert, oriented, no acute distress Respiratory: CTAB Cardiac: RRR no murmur Neck: thyroid unremarkable to palpation Neuro: Cranial nerves II through X grossly intact, able to move all extremities spontaneously Vulva: Normal appearing vulva without rashes, lesions, or deformities Vagina: Pale pink rugated vaginal tissue without obvious lesions, physiologic discharge of whitish color, cervix without lesion or overt tenderness with swab  Sensitive exam performed with chaperone in the room:  Glori Bickers, RN  ASSESSMENT/PLAN:   Routine screening for STI (sexually transmitted infection) Routine screening. Blood work and vaginal  swab. Will follow results.   Screening for cervical cancer Next due 2026 per ASCCP guidelines.   Healthcare maintenance CBC and BMP today to ensure normalization from last labs. No complaints today. Will follow results.      Fayette Pho, MD [facility]

## 2022-11-11 NOTE — Assessment & Plan Note (Signed)
Next due 2026 per ASCCP guidelines.

## 2022-11-17 ENCOUNTER — Encounter: Payer: Medicaid Other | Admitting: Family Medicine
